# Patient Record
Sex: Female | Born: 1935
Health system: Southern US, Community
[De-identification: ages and names within clinical notes are randomized; demographics above are authoritative.]

## PROBLEM LIST (undated history)

## (undated) DIAGNOSIS — E785 Hyperlipidemia, unspecified: Secondary | ICD-10-CM

## (undated) DIAGNOSIS — G40909 Epilepsy, unspecified, not intractable, without status epilepticus: Secondary | ICD-10-CM

## (undated) DIAGNOSIS — I679 Cerebrovascular disease, unspecified: Secondary | ICD-10-CM

## (undated) DIAGNOSIS — I639 Cerebral infarction, unspecified: Secondary | ICD-10-CM

## (undated) DIAGNOSIS — M1990 Unspecified osteoarthritis, unspecified site: Secondary | ICD-10-CM

## (undated) DIAGNOSIS — R112 Nausea with vomiting, unspecified: Secondary | ICD-10-CM

## (undated) DIAGNOSIS — T8859XA Other complications of anesthesia, initial encounter: Secondary | ICD-10-CM

## (undated) DIAGNOSIS — I10 Essential (primary) hypertension: Secondary | ICD-10-CM

## (undated) DIAGNOSIS — Z8673 Personal history of transient ischemic attack (TIA), and cerebral infarction without residual deficits: Secondary | ICD-10-CM

## (undated) DIAGNOSIS — N182 Chronic kidney disease, stage 2 (mild): Secondary | ICD-10-CM

## (undated) DIAGNOSIS — T4145XA Adverse effect of unspecified anesthetic, initial encounter: Secondary | ICD-10-CM

## (undated) DIAGNOSIS — E559 Vitamin D deficiency, unspecified: Secondary | ICD-10-CM

## (undated) DIAGNOSIS — R32 Unspecified urinary incontinence: Secondary | ICD-10-CM

## (undated) DIAGNOSIS — Z9889 Other specified postprocedural states: Secondary | ICD-10-CM

## (undated) HISTORY — DX: Essential (primary) hypertension: I10

## (undated) HISTORY — DX: Cerebrovascular disease, unspecified: I67.9

## (undated) HISTORY — DX: Personal history of transient ischemic attack (TIA), and cerebral infarction without residual deficits: Z86.73

## (undated) HISTORY — DX: Chronic kidney disease, stage 2 (mild): N18.2

## (undated) HISTORY — DX: Unspecified osteoarthritis, unspecified site: M19.90

## (undated) HISTORY — DX: Epilepsy, unspecified, not intractable, without status epilepticus: G40.909

## (undated) HISTORY — DX: Hyperlipidemia, unspecified: E78.5

## (undated) HISTORY — DX: Vitamin D deficiency, unspecified: E55.9

## (undated) HISTORY — DX: Cerebral infarction, unspecified: I63.9

---

## 2004-12-18 HISTORY — PX: ROTATOR CUFF REPAIR: SHX139

## 2005-12-18 HISTORY — PX: KNEE SURGERY: SHX244

## 2014-12-03 ENCOUNTER — Other Ambulatory Visit (INDEPENDENT_AMBULATORY_CARE_PROVIDER_SITE_OTHER): Payer: Medicare Other

## 2014-12-03 ENCOUNTER — Encounter: Payer: Self-pay | Admitting: Internal Medicine

## 2014-12-03 ENCOUNTER — Ambulatory Visit (INDEPENDENT_AMBULATORY_CARE_PROVIDER_SITE_OTHER): Payer: Medicare Other | Admitting: Internal Medicine

## 2014-12-03 VITALS — BP 140/62 | HR 82 | Temp 98.1°F | Resp 18 | Ht 62.0 in | Wt 105.6 lb

## 2014-12-03 DIAGNOSIS — M1611 Unilateral primary osteoarthritis, right hip: Secondary | ICD-10-CM

## 2014-12-03 DIAGNOSIS — Z0000 Encounter for general adult medical examination without abnormal findings: Secondary | ICD-10-CM | POA: Insufficient documentation

## 2014-12-03 DIAGNOSIS — E785 Hyperlipidemia, unspecified: Secondary | ICD-10-CM

## 2014-12-03 LAB — BASIC METABOLIC PANEL
BUN: 11 mg/dL (ref 6–23)
CALCIUM: 9.4 mg/dL (ref 8.4–10.5)
CO2: 25 meq/L (ref 19–32)
Chloride: 101 mEq/L (ref 96–112)
Creatinine, Ser: 0.6 mg/dL (ref 0.4–1.2)
GFR: 111.05 mL/min (ref >60.00)
Glucose, Bld: 81 mg/dL (ref 70–99)
Potassium: 4 mEq/L (ref 3.5–5.1)
SODIUM: 136 meq/L (ref 135–145)

## 2014-12-03 LAB — LIPID PANEL
Cholesterol: 281 mg/dL — ABNORMAL HIGH (ref 0–200)
HDL: 91.7 mg/dL (ref >39.00)
LDL Cholesterol: 178 mg/dL — ABNORMAL HIGH (ref 0–99)
NonHDL: 189.3
Total CHOL/HDL Ratio: 3
Triglycerides: 59 mg/dL (ref 0.0–149.0)
VLDL: 11.8 mg/dL (ref 0.0–40.0)

## 2014-12-03 NOTE — Assessment & Plan Note (Addendum)
She will follow-up with orthopedics soon. Hopefully we can continue with corticosteroid injections every 3 months and be able to avoid hip replacement. Check BMP in case she needs NSAID therapy.

## 2014-12-03 NOTE — Progress Notes (Signed)
Pre visit review using our clinic review tool, if applicable. No additional management support is needed unless otherwise documented below in the visit note. 

## 2014-12-03 NOTE — Patient Instructions (Signed)
We will check your blood work today, we will call you back with those results.  We will see back next year for your physical. If you have any new problems or questions before then please feel free to call our office.

## 2014-12-03 NOTE — Progress Notes (Signed)
   Subjective:    Patient ID: Heather Nielsen, female    DOB: 03/04/1934, 80 y.o.   MRN: [id number]  HPI The patient is a 80 year old woman who comes in today to establish care. She does have past medical history of osteoarthritis. She states that over the years it has gotten markedly worse however at this time she is still able to get around fairly well using cortisone injections every 3 months. She is concerned that eventually she will need hip replacement. She denies any chest pains, shortness of breath, abdominal pains. She's never had colon cancer screening and does not wish to have that. She describes having bad reaction with pneumonia shot last year and does not ever wish to get another. She declines other joint pain with the exception of her right hip. She states she never has any balance problems. She denies any depression or anxiety. She is occasionally stressed and her husband however deals with that fairly well.  Review of Systems  Constitutional: Negative for fever, activity change, appetite change, fatigue and unexpected weight change.  HENT: Negative.   Respiratory: Negative for cough, chest tightness, shortness of breath and wheezing.   Cardiovascular: Negative for chest pain, palpitations and leg swelling.  Gastrointestinal: Negative for nausea, abdominal pain, diarrhea, constipation and abdominal distention.  Genitourinary: Negative.   Musculoskeletal: Positive for arthralgias. Negative for myalgias, back pain and gait problem.  Skin: Negative.   Neurological: Negative for dizziness, weakness, light-headedness, numbness and headaches.  Psychiatric/Behavioral: Negative.       Objective:   Physical Exam  Constitutional: She is oriented to person, place, and time. She appears well-developed and well-nourished.  HENT:  Head: Normocephalic and atraumatic.  Eyes: EOM are normal.  Neck: Normal range of motion.  Cardiovascular: Normal rate and regular rhythm.   No murmur  heard. Carotid bilateral without bruit  Pulmonary/Chest: Effort normal and breath sounds normal. No respiratory distress. She has no wheezes. She has no rales.  Abdominal: Soft. Bowel sounds are normal. She exhibits no distension. There is no tenderness. There is no rebound.  Musculoskeletal: She exhibits no edema.  Neurological: She is alert and oriented to person, place, and time. Coordination normal.  Skin: Skin is warm and dry.   Filed Vitals:   12/03/14 0906  BP: 140/62  Pulse: 82  Temp: 98.1 F (36.7 C)  TempSrc: Oral  Resp: 18  Height: 5\' 2"  (1.575 m)  Weight: 105 lb 9.6 oz (47.9 kg)  SpO2: 95%      Assessment & Plan:

## 2014-12-03 NOTE — Assessment & Plan Note (Signed)
She states she did have colonoscopy many years ago, refuses further colonoscopy. Declines any immunizations today. Never had shingles injection. Does not wish to get further mammograms. Check lipid panel.

## 2014-12-21 ENCOUNTER — Other Ambulatory Visit: Payer: Self-pay | Admitting: Internal Medicine

## 2014-12-21 MED ORDER — PRAVASTATIN SODIUM 20 MG PO TABS: 20.0000 mg | ORAL_TABLET | Freq: Every day | ORAL | Status: DC

## 2014-12-23 ENCOUNTER — Telehealth: Payer: Self-pay | Admitting: Internal Medicine

## 2014-12-23 DIAGNOSIS — E785 Hyperlipidemia, unspecified: Secondary | ICD-10-CM

## 2014-12-23 NOTE — Telephone Encounter (Signed)
Spoke with patient and gave her lab results. According to the notes Abby spoke with patient on 12/08/14 and gave her the lab results. Patient said she was never informed and was not the person that Abby spoke to. She thinks there is a confusion and does not think these results are accurate. She states that she is 106 pounds and she is in shape. She already diets and exercises and wants to get the labs drawn again. Can we reorder the lipid panel? Please advise, thanks.

## 2014-12-23 NOTE — Telephone Encounter (Signed)
Pt called and states she never recd her lab results, I see a note she was called but she says she was not, pls advise.

## 2014-12-24 NOTE — Telephone Encounter (Signed)
Absolutely we have reorder this and she can come get when she is fasting. Sorry for the confusion.

## 2014-12-25 NOTE — Telephone Encounter (Signed)
Left message asking patient to call me back

## 2015-06-04 ENCOUNTER — Telehealth: Payer: Self-pay | Admitting: Internal Medicine

## 2015-06-04 NOTE — Progress Notes (Signed)
Please put orders in Epic surgery 06-16-15 pre op 06-10-15 Thanks 

## 2015-06-04 NOTE — Telephone Encounter (Signed)
Toniann Fail from Dr. Lequita Halt called about a clearance letter that was sent over that was not signed. She is going to fax it back over so you can sign and please fax back to [phone].

## 2015-06-08 ENCOUNTER — Ambulatory Visit: Payer: Self-pay | Admitting: Orthopedic Surgery

## 2015-06-08 NOTE — Progress Notes (Signed)
Preoperative surgical orders have been place into the Epic hospital system for Heather Nielsen on 06/08/2015, 10:38 AM  by Patrica Duel for surgery on 06-16-2015.  Preop Total Hip - Anterior Approach orders including IV Tylenol, and IV Decadron as long as there are no contraindications to the above medications. Avel Peace, PA-C

## 2015-06-09 NOTE — Patient Instructions (Addendum)
Heather Nielsen  06/09/2015   Your procedure is scheduled on: 06/16/15  [facility]  Report to [facility] Main  Entrance take [facility]  elevators to 3rd floor to  Short Stay Center at  0730 AM.  Call this number if you have problems the morning of surgery [phone]   Remember: ONLY 1 PERSON MAY GO WITH YOU TO SHORT STAY TO GET  READY MORNING OF YOUR SURGERY.  Do not eat food or drink liquids :After Midnight.TUESDAY NIGHT     Take these medicines the morning of surgery with A SIP OF WATER: NONE                               You may not have any metal on your body including hair pins and              piercings  Do not wear jewelry, make-up, lotions, powders or perfumes, deodorant             Do not wear nail polish.  Do not shave  48 hours prior to surgery.               Do not bring valuables to the hospital. [facility] IS NOT             RESPONSIBLE   FOR VALUABLES.  Contacts, dentures or bridgework may not be worn into surgery.  Leave suitcase in the car. After surgery it may be brought to your room.     Patients discharged the day of surgery will not be allowed to drive home.  Name and phone number of your driver:  Special Instructions: N/A              Please read over the following fact sheets you were given:MRSA FACT SHEET; BLOOD TRANSFUSION FACT SHEET; INCENTIVE SPIROMETER _____________________________________________________________________             [facility] - Preparing for Surgery Before surgery, you can play an important role.  Because skin is not sterile, your skin needs to be as free of germs as possible.  You can reduce the number of germs on your skin by washing with CHG (chlorahexidine gluconate) soap before surgery.  CHG is an antiseptic cleaner which kills germs and bonds with the skin to continue killing germs even after washing. Please DO NOT use if you have an allergy to CHG or antibacterial soaps.  If your skin becomes reddened/irritated  stop using the CHG and inform your nurse when you arrive at Short Stay. Do not shave (including legs and underarms) for at least 48 hours prior to the first CHG shower.  You may shave your face/neck. Please follow these instructions carefully:  1.  Shower with CHG Soap the night before surgery and the  morning of Surgery.  2.  If you choose to wash your hair, wash your hair first as usual with your  normal  shampoo.  3.  After you shampoo, rinse your hair and body thoroughly to remove the  shampoo.                           4.  Use CHG as you would any other liquid soap.  You can apply chg directly  to the skin and wash  Gently with a scrungie or clean washcloth.  5.  Apply the CHG Soap to your body ONLY FROM THE NECK DOWN.   Do not use on face/ open                           Wound or open sores. Avoid contact with eyes, ears mouth and genitals (private parts).                       Wash face,  Genitals (private parts) with your normal soap.             6.  Wash thoroughly, paying special attention to the area where your surgery  will be performed.  7.  Thoroughly rinse your body with warm water from the neck down.  8.  DO NOT shower/wash with your normal soap after using and rinsing off  the CHG Soap.                9.  Pat yourself dry with a clean towel.            10.  Wear clean pajamas.            11.  Place clean sheets on your bed the night of your first shower and do not  sleep with pets. Day of Surgery : Do not apply any lotions/deodorants the morning of surgery.  Please wear clean clothes to the hospital/surgery center.  FAILURE TO FOLLOW THESE INSTRUCTIONS MAY RESULT IN THE CANCELLATION OF YOUR SURGERY PATIENT SIGNATURE_________________________________  NURSE SIGNATURE__________________________________  ________________________________________________________________________  WHAT IS A BLOOD TRANSFUSION? Blood Transfusion Information  A transfusion is the  replacement of blood or some of its parts. Blood is made up of multiple cells which provide different functions.  Red blood cells carry oxygen and are used for blood loss replacement.  White blood cells fight against infection.  Platelets control bleeding.  Plasma helps clot blood.  Other blood products are available for specialized needs, such as hemophilia or other clotting disorders. BEFORE THE TRANSFUSION  Who gives blood for transfusions?   Healthy volunteers who are fully evaluated to make sure their blood is safe. This is blood bank blood. Transfusion therapy is the safest it has ever been in the practice of medicine. Before blood is taken from a donor, a complete history is taken to make sure that person has no history of diseases nor engages in risky social behavior (examples are intravenous drug use or sexual activity with multiple partners). The donor's travel history is screened to minimize risk of transmitting infections, such as malaria. The donated blood is tested for signs of infectious diseases, such as HIV and hepatitis. The blood is then tested to be sure it is compatible with you in order to minimize the chance of a transfusion reaction. If you or a relative donates blood, this is often done in anticipation of surgery and is not appropriate for emergency situations. It takes many days to process the donated blood. RISKS AND COMPLICATIONS Although transfusion therapy is very safe and saves many lives, the main dangers of transfusion include:  1. Getting an infectious disease. 2. Developing a transfusion reaction. This is an allergic reaction to something in the blood you were given. Every precaution is taken to prevent this. The decision to have a blood transfusion has been considered carefully by your caregiver before blood is given. Blood is not given unless the benefits outweigh  the risks. AFTER THE TRANSFUSION  Right after receiving a blood transfusion, you will usually  feel much better and more energetic. This is especially true if your red blood cells have gotten low (anemic). The transfusion raises the level of the red blood cells which carry oxygen, and this usually causes an energy increase.  The nurse administering the transfusion will monitor you carefully for complications. HOME CARE INSTRUCTIONS  No special instructions are needed after a transfusion. You may find your energy is better. Speak with your caregiver about any limitations on activity for underlying diseases you may have. SEEK MEDICAL CARE IF:   Your condition is not improving after your transfusion.  You develop redness or irritation at the intravenous (IV) site. SEEK IMMEDIATE MEDICAL CARE IF:  Any of the following symptoms occur over the next 12 hours:  Shaking chills.  You have a temperature by mouth above 102 F (38.9 C), not controlled by medicine.  Chest, back, or muscle pain.  People around you feel you are not acting correctly or are confused.  Shortness of breath or difficulty breathing.  Dizziness and fainting.  You get a rash or develop hives.  You have a decrease in urine output.  Your urine turns a dark color or changes to pink, red, or brown. Any of the following symptoms occur over the next 10 days:  You have a temperature by mouth above 102 F (38.9 C), not controlled by medicine.  Shortness of breath.  Weakness after normal activity.  The white part of the eye turns yellow (jaundice).  You have a decrease in the amount of urine or are urinating less often.  Your urine turns a dark color or changes to pink, red, or brown. Document Released: 12/01/2000 Document Revised: 02/26/2012 Document Reviewed: 07/20/2008 ExitCare Patient Information 2014 [place].  _______________________________________________________________________  Incentive Spirometer  An incentive spirometer is a tool that can help keep your lungs clear and active. This tool  measures how well you are filling your lungs with each breath. Taking long deep breaths may help reverse or decrease the chance of developing breathing (pulmonary) problems (especially infection) following:  A long period of time when you are unable to move or be active. BEFORE THE PROCEDURE   If the spirometer includes an indicator to show your best effort, your nurse or respiratory therapist will set it to a desired goal.  If possible, sit up straight or lean slightly forward. Try not to slouch.  Hold the incentive spirometer in an upright position. INSTRUCTIONS FOR USE  3. Sit on the edge of your bed if possible, or sit up as far as you can in bed or on a chair. 4. Hold the incentive spirometer in an upright position. 5. Breathe out normally. 6. Place the mouthpiece in your mouth and seal your lips tightly around it. 7. Breathe in slowly and as deeply as possible, raising the piston or the ball toward the top of the column. 8. Hold your breath for 3-5 seconds or for as long as possible. Allow the piston or ball to fall to the bottom of the column. 9. Remove the mouthpiece from your mouth and breathe out normally. 10. Rest for a few seconds and repeat Steps 1 through 7 at least 10 times every 1-2 hours when you are awake. Take your time and take a few normal breaths between deep breaths. 11. The spirometer may include an indicator to show your best effort. Use the indicator as a goal to work  toward during each repetition. 12. After each set of 10 deep breaths, practice coughing to be sure your lungs are clear. If you have an incision (the cut made at the time of surgery), support your incision when coughing by placing a pillow or rolled up towels firmly against it. Once you are able to get out of bed, walk around indoors and cough well. You may stop using the incentive spirometer when instructed by your caregiver.  RISKS AND COMPLICATIONS  Take your time so you do not get dizzy or  light-headed.  If you are in pain, you may need to take or ask for pain medication before doing incentive spirometry. It is harder to take a deep breath if you are having pain. AFTER USE  Rest and breathe slowly and easily.  It can be helpful to keep track of a log of your progress. Your caregiver can provide you with a simple table to help with this. If you are using the spirometer at home, follow these instructions: [place] IF:   You are having difficultly using the spirometer.  You have trouble using the spirometer as often as instructed.  Your pain medication is not giving enough relief while using the spirometer.  You develop fever of 100.5 F (38.1 C) or higher. SEEK IMMEDIATE MEDICAL CARE IF:   You cough up bloody sputum that had not been present before.  You develop fever of 102 F (38.9 C) or greater.  You develop worsening pain at or near the incision site. MAKE SURE YOU:   Understand these instructions.  Will watch your condition.  Will get help right away if you are not doing well or get worse. Document Released: 04/16/2007 Document Revised: 02/26/2012 Document Reviewed: 06/17/2007 [facility] Patient Information 2014 [place], [place].   ________________________________________________________________________

## 2015-06-09 NOTE — Progress Notes (Signed)
Clearance dr Dorise Hiss chart

## 2015-06-10 ENCOUNTER — Encounter (HOSPITAL_COMMUNITY)
Admission: RE | Admit: 2015-06-10 | Discharge: 2015-06-10 | Disposition: A | Discharge status: Home / Self Care | Payer: Medicare Other | Source: Ambulatory Visit | Attending: Orthopedic Surgery | Admitting: Orthopedic Surgery

## 2015-06-10 ENCOUNTER — Encounter (HOSPITAL_COMMUNITY): Payer: Self-pay

## 2015-06-10 DIAGNOSIS — Z01812 Encounter for preprocedural laboratory examination: Secondary | ICD-10-CM | POA: Diagnosis present

## 2015-06-10 DIAGNOSIS — M1611 Unilateral primary osteoarthritis, right hip: Secondary | ICD-10-CM | POA: Insufficient documentation

## 2015-06-10 HISTORY — DX: Unspecified urinary incontinence: R32

## 2015-06-10 HISTORY — DX: Other specified postprocedural states: Z98.890

## 2015-06-10 HISTORY — DX: Other specified postprocedural states: R11.2

## 2015-06-10 HISTORY — DX: Adverse effect of unspecified anesthetic, initial encounter: T41.45XA

## 2015-06-10 HISTORY — DX: Other complications of anesthesia, initial encounter: T88.59XA

## 2015-06-10 LAB — CBC
HEMATOCRIT: 42.5 % (ref 36.0–46.0)
HEMOGLOBIN: 14.6 g/dL (ref 12.0–15.0)
MCH: 30.9 pg (ref 26.0–34.0)
MCHC: 34.4 g/dL (ref 30.0–36.0)
MCV: 90 fL (ref 78.0–100.0)
Platelets: 281 10*3/uL (ref 150–400)
RBC: 4.72 MIL/uL (ref 3.87–5.11)
RDW: 12.5 % (ref 11.5–15.5)
WBC: 6.1 10*3/uL (ref 4.0–10.5)

## 2015-06-10 LAB — COMPREHENSIVE METABOLIC PANEL
ALT: 14 U/L (ref 14–54)
AST: 23 U/L (ref 15–41)
Albumin: 4.3 g/dL (ref 3.5–5.0)
Alkaline Phosphatase: 84 U/L (ref 38–126)
Anion gap: 8 (ref 5–15)
BUN: 12 mg/dL (ref 6–20)
CALCIUM: 9.2 mg/dL (ref 8.9–10.3)
CHLORIDE: 98 mmol/L — AB (ref 101–111)
CO2: 29 mmol/L (ref 22–32)
CREATININE: 0.59 mg/dL (ref 0.44–1.00)
GFR calc Af Amer: >60 mL/min (ref >60)
GFR calc non Af Amer: >60 mL/min (ref >60)
GLUCOSE: 90 mg/dL (ref 65–99)
Potassium: 4.1 mmol/L (ref 3.5–5.1)
SODIUM: 135 mmol/L (ref 135–145)
Total Bilirubin: 0.5 mg/dL (ref 0.3–1.2)
Total Protein: 7.3 g/dL (ref 6.5–8.1)

## 2015-06-10 LAB — URINE MICROSCOPIC-ADD ON

## 2015-06-10 LAB — URINALYSIS, ROUTINE W REFLEX MICROSCOPIC
Bilirubin Urine: NEGATIVE
Glucose, UA: NEGATIVE mg/dL
KETONES UR: NEGATIVE mg/dL
Nitrite: NEGATIVE
PH: 6.5 (ref 5.0–8.0)
PROTEIN: NEGATIVE mg/dL
Specific Gravity, Urine: 1.02 (ref 1.005–1.030)
Urobilinogen, UA: 1 mg/dL (ref 0.0–1.0)

## 2015-06-10 LAB — APTT: aPTT: 27 seconds (ref 24–37)

## 2015-06-10 LAB — PROTIME-INR
INR: 0.99 (ref 0.00–1.49)
PROTHROMBIN TIME: 13.3 s (ref 11.6–15.2)

## 2015-06-10 LAB — SURGICAL PCR SCREEN
MRSA, PCR: NEGATIVE
Staphylococcus aureus: NEGATIVE

## 2015-06-10 LAB — ABO/RH: ABO/RH(D): B POS

## 2015-06-10 NOTE — Progress Notes (Signed)
Urinalysis and micro results per epic per PAT visit 06/10/2015 sent to Dr Lequita Halt

## 2015-06-11 ENCOUNTER — Ambulatory Visit: Payer: Self-pay | Admitting: Orthopedic Surgery

## 2015-06-11 NOTE — H&P (Signed)
Heather Nielsen DOB: [birth year] Married / Language: English / Race: White Female Date of Admission:  06-16-2015 CC:  Right Hip Pain History of Present Illness The patient is a 81 year old female who comes in for a preoperative History and Physical. The patient is scheduled for a right total hip arthroplasty (anterior) to be performed by Dr. Gus Rankin. [name], MD at [facility] on 06-16-2015. The patient is a 81 year old female who is being followed for their right hip pain and osteoarthritis. The patient has reported only temporary improvement of their symptoms with: Cortisone injections (the last IA injection helped a lot). The cortisone injection did help her quite a bit. She said that the intense episodes of pain have subsided in the past with previous injections but does return. She has advanced arthritis. She has not had any significant progression of her arthritis. It is advanced and end stage now. She is ready to have a hip replacement at this time. They have been treated conservatively in the past for the above stated problem and despite conservative measures, they continue to have progressive pain and severe functional limitations and dysfunction. They have failed non-operative management including home exercise, medications, and injections. It is felt that they would benefit from undergoing total joint replacement. Risks and benefits of the procedure have been discussed with the patient and they elect to proceed with surgery. There are no active contraindications to surgery such as ongoing infection or rapidly progressive neurological disease.  Problem List/Past Medical Primary osteoarthritis of right hip (M16.11) Osteoporosis Osteoarthritis  Allergies  Pneumococcal 7-Val Conj Vacc *VACCINES* Penicillin  Family History Heart Disease Brother, Mother. Kidney disease Father. Cerebrovascular Accident Mother. First Degree Relatives reported Heart disease in female family  member before age 36 Hypertension Mother.  Social History  Tobacco use Former smoker. 09/08/2014: smoke(d) less than 1/2 pack(s) per day Tobacco / smoke exposure 09/08/2014: no Marital status married Not under pain contract No history of drug/alcohol rehab Number of flights of stairs before winded greater than 5 Living situation live with spouse Exercise Exercises daily; does running / walking and individual sport Current drinker 09/08/2014: Currently drinks wine and hard liquor 8-14 times per week Children 5 or more Current work status retired  Medication History  No Current Medications  Past Surgical History Arthroscopy of Knee right Arthroscopy of Shoulder right  Review of Systems  General Not Present- Chills, Fatigue, Fever, Memory Loss, Night Sweats, Weight Gain and Weight Loss. Skin Not Present- Eczema, Hives, Itching, Lesions and Rash. HEENT Not Present- Dentures, Double Vision, Headache, Hearing Loss, Tinnitus and Visual Loss. Respiratory Not Present- Allergies, Chronic Cough, Coughing up blood, Shortness of breath at rest and Shortness of breath with exertion. Cardiovascular Not Present- Chest Pain, Difficulty Breathing Lying Down, Murmur, Palpitations, Racing/skipping heartbeats and Swelling. Gastrointestinal Not Present- Abdominal Pain, Bloody Stool, Constipation, Diarrhea, Difficulty Swallowing, Heartburn, Jaundice, Loss of appetitie, Nausea and Vomiting. Female Genitourinary Present- Incontinence. Not Present- Blood in Urine, Discharge, Flank Pain, Painful Urination, Urgency, Urinary frequency, Urinary Retention, Urinating at Night and Weak urinary stream. Musculoskeletal Present- Joint Pain and Joint Swelling. Not Present- Back Pain, Morning Stiffness, Muscle Pain, Muscle Weakness and Spasms. Neurological Not Present- Blackout spells, Difficulty with balance, Dizziness, Paralysis, Tremor and Weakness. Psychiatric Not Present- Insomnia.  Vital Pulse:  64 (Regular)  BP: 154/74 (Sitting, Right Arm, Standard  Physical Exam General Mental Status -Alert, cooperative and good historian. General Appearance-pleasant, Not in acute distress. Orientation-Oriented X3. Build & Nutrition-Lean, [place], Well nourished  and Well developed.  Head and Neck Head-normocephalic, atraumatic . Neck Global Assessment - supple, no bruit auscultated on the right, no bruit auscultated on the left.  Eye Pupil - Bilateral-Regular and Round. Motion - Bilateral-EOMI.  Chest and Lung Exam Auscultation Breath sounds - clear at anterior chest wall and clear at posterior chest wall. Adventitious sounds - No Adventitious sounds.  Cardiovascular Auscultation Rhythm - Regular rate and rhythm. Heart Sounds - S1 WNL and S2 WNL. Murmurs & Other Heart Sounds - Auscultation of the heart reveals - No Murmurs.  Abdomen Palpation/Percussion Tenderness - Abdomen is non-tender to palpation. Rigidity (guarding) - Abdomen is soft. Auscultation Auscultation of the abdomen reveals - Bowel sounds normal.  Female Genitourinary Note: Not done, not pertinent to present illness   Musculoskeletal Note: On exam, she is alert and oriented in no apparent distress. Evaluation of her right hip shows flexion about 90, about 10 degrees internal rotation and 20 external rotation and 20 abduction. Gait pattern is slightly antalgic.  RADIOGRAPHS: AP pelvis and lateral of the right hip shows severe end stage arthritis of the right hip with bone on bone with significant subchondral cystic formation.  Assessment & Plan Primary osteoarthritis of right hip (M16.11) Note:Surgical Plans: Right Total Hip Replacement - Anterior Approach  Disposition: Home  PCP: Dr. Audley HoseKoller  IV TXA  Anesthesia Issues: Postop nausea following knee procedure in the past.  Signed electronically by Beckey RutterAlezandrew L Perkins, III PA-C

## 2015-06-16 ENCOUNTER — Encounter (HOSPITAL_COMMUNITY)
Admission: RE | Disposition: A | Discharge status: Home / Self Care | Payer: Self-pay | Source: Ambulatory Visit | Attending: Orthopedic Surgery

## 2015-06-16 ENCOUNTER — Encounter (HOSPITAL_COMMUNITY): Payer: Self-pay | Admitting: *Deleted

## 2015-06-16 ENCOUNTER — Inpatient Hospital Stay (HOSPITAL_COMMUNITY)
Admission: RE | Admit: 2015-06-16 | Discharge: 2015-06-17 | DRG: 470 | Disposition: A | Discharge status: Home Health | Payer: Medicare Other | Source: Ambulatory Visit | Attending: Orthopedic Surgery | Admitting: Orthopedic Surgery

## 2015-06-16 ENCOUNTER — Inpatient Hospital Stay (HOSPITAL_COMMUNITY): Payer: Medicare Other

## 2015-06-16 ENCOUNTER — Inpatient Hospital Stay (HOSPITAL_COMMUNITY): Payer: Medicare Other | Admitting: Anesthesiology

## 2015-06-16 DIAGNOSIS — Z887 Allergy status to serum and vaccine status: Secondary | ICD-10-CM

## 2015-06-16 DIAGNOSIS — M810 Age-related osteoporosis without current pathological fracture: Secondary | ICD-10-CM | POA: Diagnosis present

## 2015-06-16 DIAGNOSIS — Z880 Allergy status to penicillin: Secondary | ICD-10-CM

## 2015-06-16 DIAGNOSIS — M169 Osteoarthritis of hip, unspecified: Secondary | ICD-10-CM | POA: Diagnosis present

## 2015-06-16 DIAGNOSIS — Z87891 Personal history of nicotine dependence: Secondary | ICD-10-CM | POA: Diagnosis not present

## 2015-06-16 DIAGNOSIS — M1611 Unilateral primary osteoarthritis, right hip: Principal | ICD-10-CM | POA: Diagnosis present

## 2015-06-16 DIAGNOSIS — Z96649 Presence of unspecified artificial hip joint: Secondary | ICD-10-CM

## 2015-06-16 DIAGNOSIS — M25551 Pain in right hip: Secondary | ICD-10-CM | POA: Diagnosis present

## 2015-06-16 HISTORY — PX: TOTAL HIP ARTHROPLASTY: SHX124

## 2015-06-16 LAB — TYPE AND SCREEN
ABO/RH(D): B POS
Antibody Screen: NEGATIVE

## 2015-06-16 IMAGING — CR DG PORTABLE PELVIS
1 series · 1 of 1 positions shown · non-contrast
Comparison: None.

CLINICAL DATA: Right hip replacement

EXAM:
PORTABLE PELVIS 1-2 VIEWS

[AP]
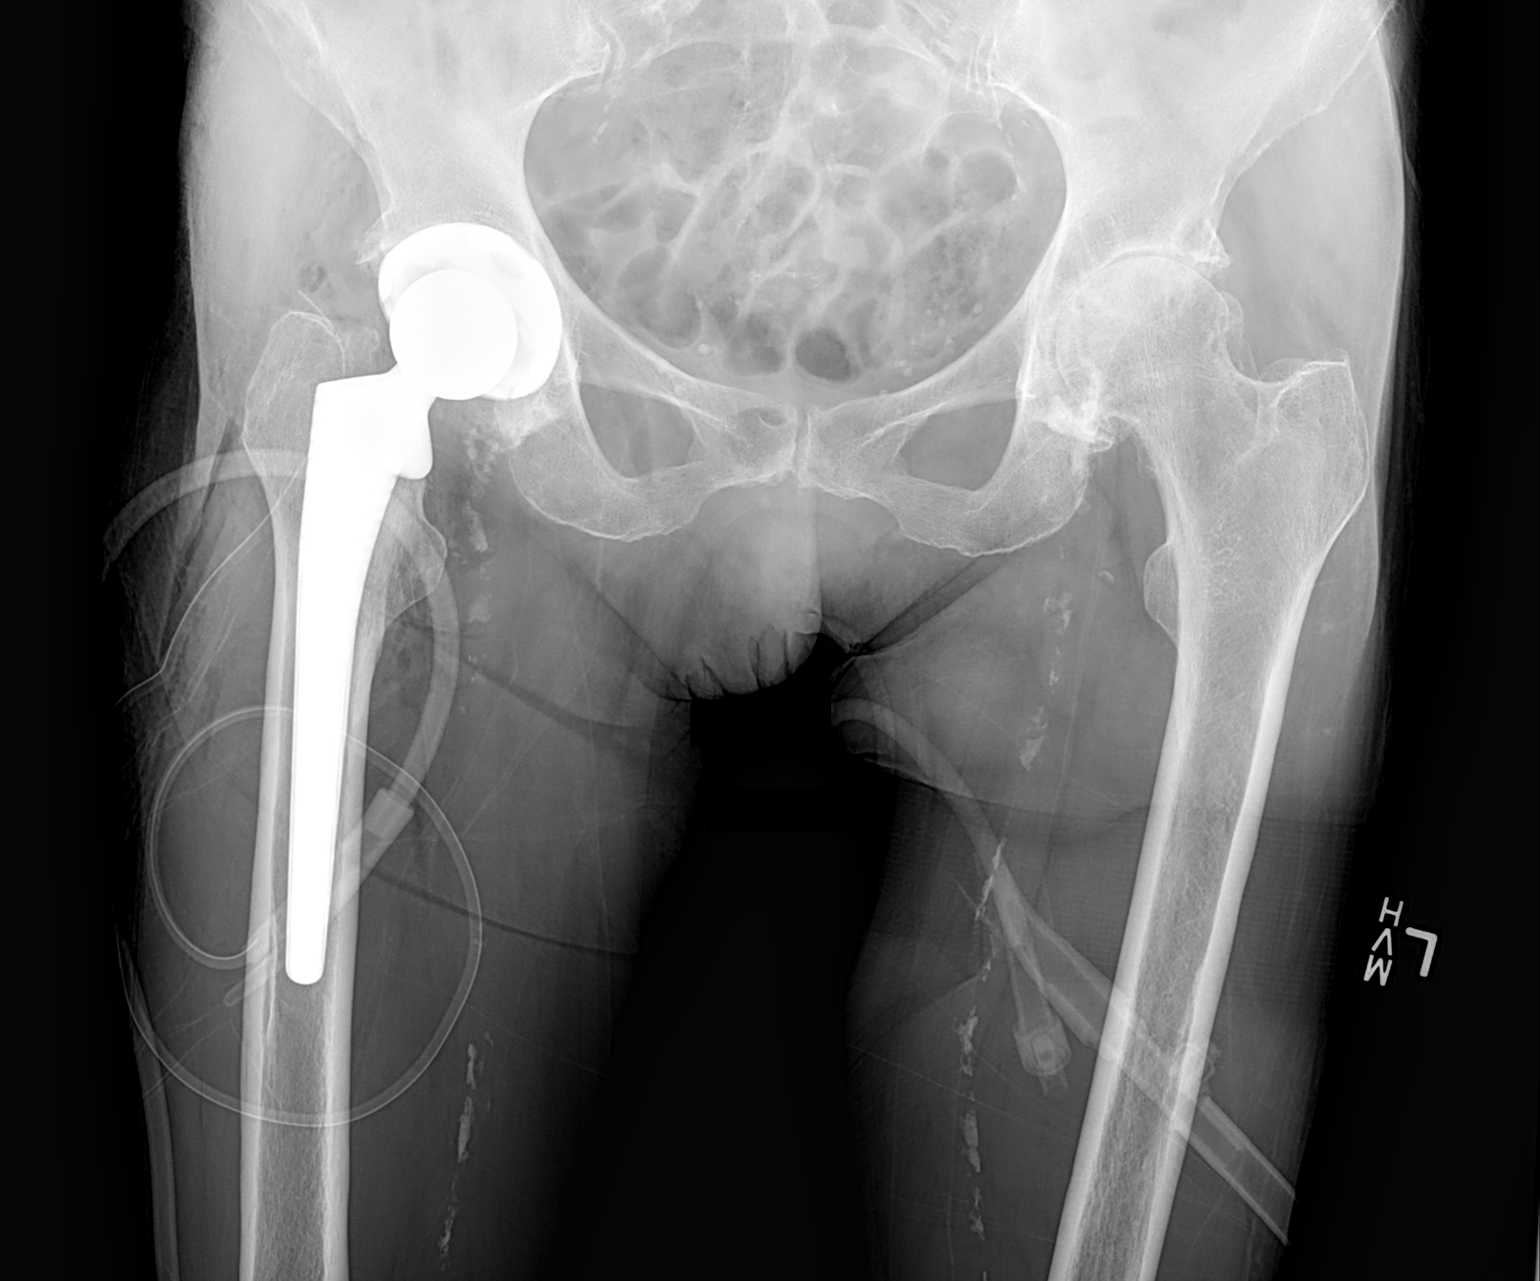

[1 of 1 positions shown; findings below may reference images not displayed]

FINDINGS: Interval right total hip arthroplasty without failure or
complication. No fracture or dislocation. Postsurgical changes in
the soft tissues surrounding the hip. Surgical drain in place.

Severe osteoarthritis of the left hip with severe axial joint space
narrowing.
IMPRESSION: Interval, right total hip arthroplasty.

## 2015-06-16 SURGERY — ARTHROPLASTY, HIP, TOTAL, ANTERIOR APPROACH
Anesthesia: General | Site: Hip | Laterality: Right

## 2015-06-16 MED ORDER — METOCLOPRAMIDE HCL 10 MG PO TABS: 5.0000 mg | ORAL_TABLET | Freq: Three times a day (TID) | ORAL | Status: DC | PRN

## 2015-06-16 MED ORDER — ONDANSETRON HCL 4 MG PO TABS: 4.0000 mg | ORAL_TABLET | Freq: Four times a day (QID) | ORAL | Status: DC | PRN

## 2015-06-16 MED ORDER — DEXAMETHASONE SODIUM PHOSPHATE 10 MG/ML IJ SOLN
10.0000 mg | Freq: Once | INTRAMUSCULAR | Status: AC
  Administered 2015-06-16: 10 mg via INTRAVENOUS

## 2015-06-16 MED ORDER — DOCUSATE SODIUM 100 MG PO CAPS
100.0000 mg | ORAL_CAPSULE | Freq: Two times a day (BID) | ORAL | Status: DC
  Administered 2015-06-16 – 2015-06-17 (×2): 100 mg via ORAL

## 2015-06-16 MED ORDER — ACETAMINOPHEN 10 MG/ML IV SOLN
INTRAVENOUS | Status: AC
  Filled 2015-06-16: qty 100

## 2015-06-16 MED ORDER — BUPIVACAINE LIPOSOME 1.3 % IJ SUSP
20.0000 mL | Freq: Once | INTRAMUSCULAR | Status: DC
  Filled 2015-06-16: qty 20

## 2015-06-16 MED ORDER — FLEET ENEMA 7-19 GM/118ML RE ENEM: 1.0000 | ENEMA | Freq: Once | RECTAL | Status: AC | PRN

## 2015-06-16 MED ORDER — 0.9 % SODIUM CHLORIDE (POUR BTL) OPTIME
TOPICAL | Status: DC | PRN
  Administered 2015-06-16: 1000 mL

## 2015-06-16 MED ORDER — HYDROMORPHONE HCL 1 MG/ML IJ SOLN
INTRAMUSCULAR | Status: AC
  Filled 2015-06-16: qty 1

## 2015-06-16 MED ORDER — METOCLOPRAMIDE HCL 5 MG/ML IJ SOLN: 5.0000 mg | Freq: Three times a day (TID) | INTRAMUSCULAR | Status: DC | PRN

## 2015-06-16 MED ORDER — MIDAZOLAM HCL 5 MG/5ML IJ SOLN
INTRAMUSCULAR | Status: DC | PRN
  Administered 2015-06-16: 1 mg via INTRAVENOUS

## 2015-06-16 MED ORDER — FENTANYL CITRATE (PF) 100 MCG/2ML IJ SOLN
25.0000 ug | INTRAMUSCULAR | Status: DC | PRN
  Administered 2015-06-16: 25 ug via INTRAVENOUS

## 2015-06-16 MED ORDER — OXYCODONE HCL 5 MG PO TABS: 5.0000 mg | ORAL_TABLET | ORAL | Status: DC | PRN

## 2015-06-16 MED ORDER — DEXTROSE 5 % IV SOLN
500.0000 mg | Freq: Four times a day (QID) | INTRAVENOUS | Status: DC | PRN
  Administered 2015-06-16: 500 mg via INTRAVENOUS
  Filled 2015-06-16 (×2): qty 5

## 2015-06-16 MED ORDER — PHENOL 1.4 % MT LIQD: 1.0000 | OROMUCOSAL | Status: DC | PRN

## 2015-06-16 MED ORDER — DEXTROSE-NACL 5-0.9 % IV SOLN
INTRAVENOUS | Status: DC
  Administered 2015-06-16: 13:00:00 via INTRAVENOUS

## 2015-06-16 MED ORDER — BISACODYL 10 MG RE SUPP: 10.0000 mg | Freq: Every day | RECTAL | Status: DC | PRN

## 2015-06-16 MED ORDER — VANCOMYCIN HCL IN DEXTROSE 1-5 GM/200ML-% IV SOLN
1000.0000 mg | Freq: Two times a day (BID) | INTRAVENOUS | Status: AC
  Administered 2015-06-16: 1000 mg via INTRAVENOUS
  Filled 2015-06-16: qty 200

## 2015-06-16 MED ORDER — PROPOFOL 10 MG/ML IV BOLUS
INTRAVENOUS | Status: AC
  Filled 2015-06-16: qty 20

## 2015-06-16 MED ORDER — ACETAMINOPHEN 325 MG PO TABS: 650.0000 mg | ORAL_TABLET | Freq: Four times a day (QID) | ORAL | Status: DC | PRN

## 2015-06-16 MED ORDER — EPHEDRINE SULFATE 50 MG/ML IJ SOLN
INTRAMUSCULAR | Status: DC | PRN
  Administered 2015-06-16: 10 mg via INTRAVENOUS
  Administered 2015-06-16 (×3): 5 mg via INTRAVENOUS

## 2015-06-16 MED ORDER — EPHEDRINE SULFATE 50 MG/ML IJ SOLN
INTRAMUSCULAR | Status: AC
  Filled 2015-06-16: qty 1

## 2015-06-16 MED ORDER — MORPHINE SULFATE 2 MG/ML IJ SOLN: 1.0000 mg | INTRAMUSCULAR | Status: DC | PRN

## 2015-06-16 MED ORDER — POLYETHYLENE GLYCOL 3350 17 G PO PACK: 17.0000 g | PACK | Freq: Every day | ORAL | Status: DC | PRN

## 2015-06-16 MED ORDER — FENTANYL CITRATE (PF) 100 MCG/2ML IJ SOLN
INTRAMUSCULAR | Status: AC
  Filled 2015-06-16: qty 2

## 2015-06-16 MED ORDER — LACTATED RINGERS IV SOLN: INTRAVENOUS | Status: DC

## 2015-06-16 MED ORDER — MENTHOL 3 MG MT LOZG: 1.0000 | LOZENGE | OROMUCOSAL | Status: DC | PRN

## 2015-06-16 MED ORDER — ACETAMINOPHEN 650 MG RE SUPP: 650.0000 mg | Freq: Four times a day (QID) | RECTAL | Status: DC | PRN

## 2015-06-16 MED ORDER — MEPERIDINE HCL 50 MG/ML IJ SOLN: 6.2500 mg | INTRAMUSCULAR | Status: DC | PRN

## 2015-06-16 MED ORDER — PROPOFOL INFUSION 10 MG/ML OPTIME
INTRAVENOUS | Status: DC | PRN
  Administered 2015-06-16: 75 ug/kg/min via INTRAVENOUS

## 2015-06-16 MED ORDER — TRANEXAMIC ACID 1000 MG/10ML IV SOLN
1000.0000 mg | INTRAVENOUS | Status: AC
  Administered 2015-06-16: 1000 mg via INTRAVENOUS
  Filled 2015-06-16: qty 10

## 2015-06-16 MED ORDER — RIVAROXABAN 10 MG PO TABS
10.0000 mg | ORAL_TABLET | Freq: Every day | ORAL | Status: DC
  Administered 2015-06-17: 10 mg via ORAL
  Filled 2015-06-16 (×2): qty 1

## 2015-06-16 MED ORDER — SODIUM CHLORIDE 0.9 % IV SOLN: INTRAVENOUS | Status: DC

## 2015-06-16 MED ORDER — METHOCARBAMOL 500 MG PO TABS
500.0000 mg | ORAL_TABLET | Freq: Four times a day (QID) | ORAL | Status: DC | PRN
  Administered 2015-06-16: 500 mg via ORAL
  Filled 2015-06-16: qty 1

## 2015-06-16 MED ORDER — BUPIVACAINE HCL (PF) 0.25 % IJ SOLN
INTRAMUSCULAR | Status: DC | PRN
  Administered 2015-06-16: 30 mL

## 2015-06-16 MED ORDER — FENTANYL CITRATE (PF) 100 MCG/2ML IJ SOLN
INTRAMUSCULAR | Status: DC | PRN
Start: 2015-06-16 — End: 2015-06-16
  Administered 2015-06-16: 50 ug via INTRAVENOUS

## 2015-06-16 MED ORDER — PROMETHAZINE HCL 25 MG/ML IJ SOLN: 6.2500 mg | INTRAMUSCULAR | Status: DC | PRN

## 2015-06-16 MED ORDER — DIPHENHYDRAMINE HCL 12.5 MG/5ML PO ELIX: 12.5000 mg | ORAL_SOLUTION | ORAL | Status: DC | PRN

## 2015-06-16 MED ORDER — VANCOMYCIN HCL IN DEXTROSE 1-5 GM/200ML-% IV SOLN
1000.0000 mg | INTRAVENOUS | Status: AC
  Administered 2015-06-16: 1000 mg via INTRAVENOUS
  Filled 2015-06-16: qty 200

## 2015-06-16 MED ORDER — ONDANSETRON HCL 4 MG/2ML IJ SOLN: 4.0000 mg | Freq: Four times a day (QID) | INTRAMUSCULAR | Status: DC | PRN

## 2015-06-16 MED ORDER — MIDAZOLAM HCL 2 MG/2ML IJ SOLN
INTRAMUSCULAR | Status: AC
  Filled 2015-06-16: qty 2

## 2015-06-16 MED ORDER — CHLORHEXIDINE GLUCONATE 4 % EX LIQD: 60.0000 mL | Freq: Once | CUTANEOUS | Status: DC

## 2015-06-16 MED ORDER — LACTATED RINGERS IV SOLN
INTRAVENOUS | Status: DC
  Administered 2015-06-16 (×3): via INTRAVENOUS

## 2015-06-16 MED ORDER — ACETAMINOPHEN 500 MG PO TABS
1000.0000 mg | ORAL_TABLET | Freq: Four times a day (QID) | ORAL | Status: AC
  Administered 2015-06-16 (×2): 1000 mg via ORAL
  Filled 2015-06-16 (×2): qty 2

## 2015-06-16 MED ORDER — ACETAMINOPHEN 10 MG/ML IV SOLN
1000.0000 mg | Freq: Once | INTRAVENOUS | Status: AC
  Administered 2015-06-16: 1000 mg via INTRAVENOUS

## 2015-06-16 MED ORDER — PHENYLEPHRINE HCL 10 MG/ML IJ SOLN
INTRAMUSCULAR | Status: DC | PRN
  Administered 2015-06-16 (×7): 40 ug via INTRAVENOUS

## 2015-06-16 MED ORDER — BUPIVACAINE HCL (PF) 0.25 % IJ SOLN
INTRAMUSCULAR | Status: AC
  Filled 2015-06-16: qty 30

## 2015-06-16 MED ORDER — KETOROLAC TROMETHAMINE 15 MG/ML IJ SOLN: 7.5000 mg | Freq: Four times a day (QID) | INTRAMUSCULAR | Status: DC | PRN

## 2015-06-16 MED ORDER — DEXAMETHASONE SODIUM PHOSPHATE 10 MG/ML IJ SOLN
10.0000 mg | Freq: Once | INTRAMUSCULAR | Status: AC
  Administered 2015-06-17: 10 mg via INTRAVENOUS
  Filled 2015-06-16: qty 1

## 2015-06-16 MED ORDER — TRAMADOL HCL 50 MG PO TABS: 50.0000 mg | ORAL_TABLET | Freq: Four times a day (QID) | ORAL | Status: DC | PRN

## 2015-06-16 SURGICAL SUPPLY — 40 items
BAG DECANTER FOR FLEXI CONT (MISCELLANEOUS) ×2 IMPLANT
BAG ZIPLOCK 12X15 (MISCELLANEOUS) IMPLANT
BLADE EXTENDED COATED 6.5IN (ELECTRODE) ×2 IMPLANT
BLADE SAG 18X100X1.27 (BLADE) ×2 IMPLANT
CAPT HIP TOTAL 2 ×2 IMPLANT
COVER PERINEAL POST (MISCELLANEOUS) ×2 IMPLANT
DECANTER SPIKE VIAL GLASS SM (MISCELLANEOUS) ×2 IMPLANT
DRAPE C-ARM 42X120 X-RAY (DRAPES) ×2 IMPLANT
DRAPE STERI IOBAN 125X83 (DRAPES) ×2 IMPLANT
DRAPE U-SHAPE 47X51 STRL (DRAPES) ×6 IMPLANT
DRSG ADAPTIC 3X8 NADH LF (GAUZE/BANDAGES/DRESSINGS) ×2 IMPLANT
DRSG MEPILEX BORDER 4X4 (GAUZE/BANDAGES/DRESSINGS) ×2 IMPLANT
DRSG MEPILEX BORDER 4X8 (GAUZE/BANDAGES/DRESSINGS) ×2 IMPLANT
DURAPREP 26ML APPLICATOR (WOUND CARE) ×2 IMPLANT
ELECT REM PT RETURN 9FT ADLT (ELECTROSURGICAL) ×2
ELECTRODE REM PT RTRN 9FT ADLT (ELECTROSURGICAL) ×1 IMPLANT
EVACUATOR 1/8 PVC DRAIN (DRAIN) ×2 IMPLANT
FACESHIELD WRAPAROUND (MASK) ×8 IMPLANT
GLOVE BIO SURGEON STRL SZ7.5 (GLOVE) ×2 IMPLANT
GLOVE BIO SURGEON STRL SZ8 (GLOVE) ×4 IMPLANT
GLOVE BIOGEL PI IND STRL 8 (GLOVE) ×2 IMPLANT
GLOVE BIOGEL PI INDICATOR 8 (GLOVE) ×2
GOWN STRL REUS W/TWL LRG LVL3 (GOWN DISPOSABLE) ×2 IMPLANT
GOWN STRL REUS W/TWL XL LVL3 (GOWN DISPOSABLE) ×2 IMPLANT
KIT BASIN OR (CUSTOM PROCEDURE TRAY) ×2 IMPLANT
NDL SAFETY ECLIPSE 18X1.5 (NEEDLE) ×1 IMPLANT
NEEDLE HYPO 18GX1.5 SHARP (NEEDLE) ×1
PACK TOTAL JOINT (CUSTOM PROCEDURE TRAY) ×2 IMPLANT
PEN SKIN MARKING BROAD (MISCELLANEOUS) ×2 IMPLANT
STRIP CLOSURE SKIN 1/2X4 (GAUZE/BANDAGES/DRESSINGS) ×2 IMPLANT
SUT ETHIBOND NAB CT1 #1 30IN (SUTURE) ×2 IMPLANT
SUT MNCRL AB 4-0 PS2 18 (SUTURE) ×2 IMPLANT
SUT VIC AB 2-0 CT1 27 (SUTURE) ×2
SUT VIC AB 2-0 CT1 TAPERPNT 27 (SUTURE) ×2 IMPLANT
SUT VLOC 180 0 24IN GS25 (SUTURE) ×2 IMPLANT
SYR 30ML LL (SYRINGE) ×2 IMPLANT
SYR 50ML LL SCALE MARK (SYRINGE) IMPLANT
TOWEL OR 17X26 10 PK STRL BLUE (TOWEL DISPOSABLE) ×2 IMPLANT
TOWEL OR NON WOVEN STRL DISP B (DISPOSABLE) IMPLANT
TRAY FOLEY W/METER SILVER 14FR (SET/KITS/TRAYS/PACK) ×2 IMPLANT

## 2015-06-16 NOTE — Anesthesia Procedure Notes (Signed)
Spinal Patient location during procedure: OR Start time: 06/16/2015 9:45 AM End time: 06/16/2015 9:56 AM Staffing Resident/CRNA: Bailey Faiella Performed by: resident/CRNA  Preanesthetic Checklist Completed: patient identified, site marked, surgical consent, pre-op evaluation, timeout performed, IV checked, risks and benefits discussed and monitors and equipment checked Spinal Block Patient position: sitting Prep: Betadine Patient monitoring: heart rate, cardiac monitor, continuous pulse ox and blood pressure Approach: left paramedian Location: L3-4 Injection technique: single-shot Needle Needle type: Spinocan  Needle gauge: 22 G Needle length: 9 cm Needle insertion depth: 6 cm Assessment Sensory level: T6 Additional Notes -heme, -para, VSS. Lot [id number] Exp  2017-10

## 2015-06-16 NOTE — Op Note (Signed)
OPERATIVE REPORT  PREOPERATIVE DIAGNOSIS: Osteoarthritis of the Right hip.   POSTOPERATIVE DIAGNOSIS: Osteoarthritis of the Right hip.   PROCEDURE: Right total hip arthroplasty, anterior approach.   SURGEON: Ollen GrossFrank Jonella Redditt, MD   ASSISTANT: Avel Peacerew Perkins, PA-C  ANESTHESIA:  Spinal  ESTIMATED BLOOD LOSS:-100 ml   DRAINS: Hemovac x1.   COMPLICATIONS: None   CONDITION: PACU - hemodynamically stable.   BRIEF CLINICAL NOTE: Heather Nielsen is a 81 y.o. female who has advanced end-  stage arthritis of her Right  hip with progressively worsening pain and  dysfunction.The patient has failed nonoperative management and presents for  total hip arthroplasty.   PROCEDURE IN DETAIL: After successful administration of spinal  anesthetic, the traction boots for the [facility] bed were placed on both  feet and the [facility], boots placed into the leg  holders. The Right hip was then isolated from the perineum with plastic  drapes and prepped and draped in the usual sterile fashion. ASIS and  greater trochanter were marked and a oblique incision was made, starting  at about 1 cm lateral and 2 cm distal to the ASIS and coursing towards  the anterior cortex of the femur. The skin was cut with a 10 blade  through subcutaneous tissue to the level of the fascia overlying the  tensor fascia lata muscle. The fascia was then incised in line with the  incision at the junction of the anterior third and posterior 2/3rd. The  muscle was teased off the fascia and then the interval between the TFL  and the rectus was developed. The Hohmann retractor was then placed at  the top of the femoral neck over the capsule. The vessels overlying the  capsule were cauterized and the fat on top of the capsule was removed.  A Hohmann retractor was then placed anterior underneath the rectus  femoris to give exposure to the entire anterior capsule. A T-shaped  capsulotomy was performed. The  edges were tagged and the femoral head  was identified.       Osteophytes are removed off the superior acetabulum.  The femoral neck was then cut in situ with an oscillating saw. Traction  was then applied to the left lower extremity utilizing the [facility]  traction. The femoral head was then removed. Retractors were placed  around the acetabulum and then circumferential removal of the labrum was  performed. Osteophytes were also removed. Reaming starts at 45 mm to  medialize and  Increased in 2 mm increments to 49 mm. We reamed in  approximately 40 degrees of abduction, 20 degrees anteversion. A 50 mm  pinnacle acetabular shell was then impacted in anatomic position under  fluoroscopic guidance with excellent purchase. We did not need to place  any additional dome screws. A 32 mm neutral + 4 marathon liner was then  placed into the acetabular shell.       The femoral lift was then placed along the lateral aspect of the femur  just distal to the vastus ridge. The leg was  externally rotated and capsule  was stripped off the inferior aspect of the femoral neck down to the  level of the lesser trochanter, this was done with electrocautery. The femur was lifted after this was performed. The  leg was then placed and extended in adducted position to essentially delivering the femur. We also removed the capsule superiorly and the  piriformis from the piriformis fossa to  gain excellent exposure of the  proximal femur. Rongeur was used to remove some cancellous bone to get  into the lateral portion of the proximal femur for placement of the  initial starter reamer. The starter broaches was placed  the starter broach  and was shown to go down the center of the canal. Broaching  with the  Corail system was then performed starting at size 8, coursing  Up to size 13. A size 13 had excellent torsional and rotational  and axial stability. The trial high offset neck was then placed  with a 32 + 1 trial head.  The hip was then reduced. We confirmed that  the stem was in the canal both on AP and lateral x-rays. It also has excellent sizing. The hip was reduced with outstanding stability through full extension, full external rotation,  and then flexion in adduction internal rotation. AP pelvis was taken  and the leg lengths were measured and found to be exactly equal. Hip  was then dislocated again and the femoral head and neck removed. The  femoral broach was removed. Size 13 Corail stem with a high offset  neck was then impacted into the femur following native anteversion. Has  excellent purchase in the canal. Excellent torsional and rotational and  axial stability. It is confirmed to be in the canal on AP and lateral  fluoroscopic views. The 32 + 1 ceramic head was placed and the hip  reduced with outstanding stability. Again AP pelvis was taken and it  confirmed that the leg lengths were equal. The wound was then copiously  irrigated with saline solution and the capsule reattached and repaired  with Ethibond suture.  30 ml of .25% Bupivicaine injected into the capsule and into the edge of the tensor fascia lata as well as subcutaneous tissue. The fascia overlying the tensor fascia lata was  then closed with a running #1 V-Loc. Subcu was closed with interrupted  2-0 Vicryl and subcuticular running 4-0 Monocryl. Incision was cleaned  and dried. Steri-Strips and a bulky sterile dressing applied. Hemovac  drain was hooked to suction and then he was awakened and transported to  recovery in stable condition.        Please note that a surgical assistant was a medical necessity for this procedure to perform it in a safe and expeditious manner. Assistant was necessary to provide appropriate retraction of vital neurovascular structures and to prevent femoral fracture and allow for anatomic placement of the prosthesis.  Ollen Gross, M.D.

## 2015-06-16 NOTE — Anesthesia Postprocedure Evaluation (Signed)
  Anesthesia Post-op Note  Patient: Heather Nielsen  Procedure(s) Performed: Procedure(s) (LRB): RIGHT TOTAL HIP ARTHROPLASTY ANTERIOR APPROACH (Right)  Patient Location: PACU  Anesthesia Type: Spinal  Level of Consciousness: awake and alert   Airway and Oxygen Therapy: Patient Spontanous Breathing  Post-op Pain: mild  Post-op Assessment: Post-op Vital signs reviewed, Patient's Cardiovascular Status Stable, Respiratory Function Stable, Patent Airway and No signs of Nausea or vomiting  Last Vitals:  Filed Vitals:   06/16/15 1530  BP: 129/57  Pulse: 72  Temp: 36.6 C  Resp: 14    Post-op Vital Signs: stable   Complications: No apparent anesthesia complications

## 2015-06-16 NOTE — H&P (View-Only) (Signed)
Heather Nielsen DOB: 11/21/1934 Married / Language: English / Race: White Female Date of Admission:  06-16-2015 CC:  Right Hip Pain History of Present Illness The patient is a 81 year old female who comes in for a preoperative History and Physical. The patient is scheduled for a right total hip arthroplasty (anterior) to be performed by Dr. Frank V. [name], MD at [facility] on 06-16-2015. The patient is a 80 year old female who is being followed for their right hip pain and osteoarthritis. The patient has reported only temporary improvement of their symptoms with: Cortisone injections (the last IA injection helped a lot). The cortisone injection did help her quite a bit. She said that the intense episodes of pain have subsided in the past with previous injections but does return. She has advanced arthritis. She has not had any significant progression of her arthritis. It is advanced and end stage now. She is ready to have a hip replacement at this time. They have been treated conservatively in the past for the above stated problem and despite conservative measures, they continue to have progressive pain and severe functional limitations and dysfunction. They have failed non-operative management including home exercise, medications, and injections. It is felt that they would benefit from undergoing total joint replacement. Risks and benefits of the procedure have been discussed with the patient and they elect to proceed with surgery. There are no active contraindications to surgery such as ongoing infection or rapidly progressive neurological disease.  Problem List/Past Medical Primary osteoarthritis of right hip (M16.11) Osteoporosis Osteoarthritis  Allergies  Pneumococcal 7-Val Conj Vacc *VACCINES* Penicillin  Family History Heart Disease Brother, Mother. Kidney disease Father. Cerebrovascular Accident Mother. First Degree Relatives reported Heart disease in female family  member before age 65 Hypertension Mother.  Social History  Tobacco use Former smoker. 09/08/2014: smoke(d) less than 1/2 pack(s) per day Tobacco / smoke exposure 09/08/2014: no Marital status married Not under pain contract No history of drug/alcohol rehab Number of flights of stairs before winded greater than 5 Living situation live with spouse Exercise Exercises daily; does running / walking and individual sport Current drinker 09/08/2014: Currently drinks wine and hard liquor 8-14 times per week Children 5 or more Current work status retired  Medication History  No Current Medications  Past Surgical History Arthroscopy of Knee right Arthroscopy of Shoulder right  Review of Systems  General Not Present- Chills, Fatigue, Fever, Memory Loss, Night Sweats, Weight Gain and Weight Loss. Skin Not Present- Eczema, Hives, Itching, Lesions and Rash. HEENT Not Present- Dentures, Double Vision, Headache, Hearing Loss, Tinnitus and Visual Loss. Respiratory Not Present- Allergies, Chronic Cough, Coughing up blood, Shortness of breath at rest and Shortness of breath with exertion. Cardiovascular Not Present- Chest Pain, Difficulty Breathing Lying Down, Murmur, Palpitations, Racing/skipping heartbeats and Swelling. Gastrointestinal Not Present- Abdominal Pain, Bloody Stool, Constipation, Diarrhea, Difficulty Swallowing, Heartburn, Jaundice, Loss of appetitie, Nausea and Vomiting. Female Genitourinary Present- Incontinence. Not Present- Blood in Urine, Discharge, Flank Pain, Painful Urination, Urgency, Urinary frequency, Urinary Retention, Urinating at Night and Weak urinary stream. Musculoskeletal Present- Joint Pain and Joint Swelling. Not Present- Back Pain, Morning Stiffness, Muscle Pain, Muscle Weakness and Spasms. Neurological Not Present- Blackout spells, Difficulty with balance, Dizziness, Paralysis, Tremor and Weakness. Psychiatric Not Present- Insomnia.  Vital Pulse:  64 (Regular)  BP: 154/74 (Sitting, Right Arm, Standard  Physical Exam General Mental Status -Alert, cooperative and good historian. General Appearance-pleasant, Not in acute distress. Orientation-Oriented X3. Build & Nutrition-Lean, Petite, Well nourished   and Well developed.  Head and Neck Head-normocephalic, atraumatic . Neck Global Assessment - supple, no bruit auscultated on the right, no bruit auscultated on the left.  Eye Pupil - Bilateral-Regular and Round. Motion - Bilateral-EOMI.  Chest and Lung Exam Auscultation Breath sounds - clear at anterior chest wall and clear at posterior chest wall. Adventitious sounds - No Adventitious sounds.  Cardiovascular Auscultation Rhythm - Regular rate and rhythm. Heart Sounds - S1 WNL and S2 WNL. Murmurs & Other Heart Sounds - Auscultation of the heart reveals - No Murmurs.  Abdomen Palpation/Percussion Tenderness - Abdomen is non-tender to palpation. Rigidity (guarding) - Abdomen is soft. Auscultation Auscultation of the abdomen reveals - Bowel sounds normal.  Female Genitourinary Note: Not done, not pertinent to present illness   Musculoskeletal Note: On exam, she is alert and oriented in no apparent distress. Evaluation of her right hip shows flexion about 90, about 10 degrees internal rotation and 20 external rotation and 20 abduction. Gait pattern is slightly antalgic.  RADIOGRAPHS: AP pelvis and lateral of the right hip shows severe end stage arthritis of the right hip with bone on bone with significant subchondral cystic formation.  Assessment & Plan Primary osteoarthritis of right hip (M16.11) Note:Surgical Plans: Right Total Hip Replacement - Anterior Approach  Disposition: Home  PCP: Dr. Audley HoseKoller  IV TXA  Anesthesia Issues: Postop nausea following knee procedure in the past.  Signed electronically by Beckey RutterAlezandrew L Perkins, III PA-C

## 2015-06-16 NOTE — Interval H&P Note (Signed)
History and Physical Interval Note:  06/16/2015 9:43 AM  Heather Nielsen  has presented today for surgery, with the diagnosis of OA OF RIGHT HIP  The various methods of treatment have been discussed with the patient and family. After consideration of risks, benefits and other options for treatment, the patient has consented to  Procedure(s): RIGHT TOTAL HIP ARTHROPLASTY ANTERIOR APPROACH (Right) as a surgical intervention .  The patient's history has been reviewed, patient examined, no change in status, stable for surgery.  I have reviewed the patient's chart and labs.  Questions were answered to the patient's satisfaction.     Loanne DrillingALUISIO,Ammi Hutt V

## 2015-06-16 NOTE — Anesthesia Preprocedure Evaluation (Signed)
Anesthesia Evaluation  Patient identified by MRN, date of birth, ID band Patient awake    Reviewed: Allergy & Precautions, NPO status , Patient's Chart, lab work & pertinent test results  History of Anesthesia Complications (+) PONV  Airway Mallampati: II  TM Distance: >3 FB Neck ROM: Full    Dental no notable dental hx.    Pulmonary former smoker,  breath sounds clear to auscultation  Pulmonary exam normal       Cardiovascular negative cardio ROS Normal cardiovascular examRhythm:Regular Rate:Normal     Neuro/Psych negative neurological ROS  negative psych ROS   GI/Hepatic negative GI ROS, Neg liver ROS,   Endo/Other  negative endocrine ROS  Renal/GU negative Renal ROS  negative genitourinary   Musculoskeletal negative musculoskeletal ROS (+)   Abdominal   Peds negative pediatric ROS (+)  Hematology negative hematology ROS (+)   Anesthesia Other Findings   Reproductive/Obstetrics negative OB ROS                             Anesthesia Physical Anesthesia Plan  ASA: I  Anesthesia Plan: General and Spinal   Post-op Pain Management:    Induction: Intravenous  Airway Management Planned: Simple Face Mask  Additional Equipment:   Intra-op Plan:   Post-operative Plan:   Informed Consent: I have reviewed the patients History and Physical, chart, labs and discussed the procedure including the risks, benefits and alternatives for the proposed anesthesia with the patient or authorized representative who has indicated his/her understanding and acceptance.   Dental advisory given  Plan Discussed with: CRNA  Anesthesia Plan Comments:         Anesthesia Quick Evaluation

## 2015-06-16 NOTE — Evaluation (Signed)
Physical Therapy Evaluation Patient Details Name: Heather Nielsen MRN: [id number] DOB: 12/28/1933 Today's Date: 06/16/2015   History of Present Illness  R THR  Clinical Impression  Pt s/p R THR presents with decreased R LE strength/ROM and post op pain limiting functional mobility.  Pt should progress well to dc home with family assist and HHPT follow up.    Follow Up Recommendations Home health PT    Equipment Recommendations  Rolling walker with 5" wheels    Recommendations for Other Services OT consult     Precautions / Restrictions Precautions Precautions: Fall Restrictions Weight Bearing Restrictions: No Other Position/Activity Restrictions: WBAT      Mobility  Bed Mobility Overal bed mobility: Needs Assistance Bed Mobility: Supine to Sit     Supine to sit: Min assist     General bed mobility comments: cues for sequence and use of L LE to self assist  Transfers Overall transfer level: Needs assistance Equipment used: Rolling walker (2 wheeled) Transfers: Sit to/from Stand Sit to Stand: Min assist         General transfer comment: cues for LE management and use of UEs to self assist  Ambulation/Gait Ambulation/Gait assistance: Min assist Ambulation Distance (Feet): 84 Feet Assistive device: Rolling walker (2 wheeled) Gait Pattern/deviations: Step-to pattern;Step-through pattern;Decreased step length - right;Decreased step length - left;Shuffle;Trunk flexed     General Gait Details: cues for posture, position from RW and initial sequence  Stairs            Wheelchair Mobility    Modified Rankin (Stroke Patients Only)       Balance                                             Pertinent Vitals/Pain Pain Assessment: 0-10 Pain Score: 4  Pain Location: R hip Pain Descriptors / Indicators: Aching;Burning Pain Intervention(s): Limited activity within patient's tolerance;Monitored during session;Premedicated before session;Ice  applied    Home Living Family/patient expects to be discharged to:: Private residence Living Arrangements: Spouse/significant other Available Help at Discharge: Family Type of Home: House Home Access: Stairs to enter   Secretary/administratorntrance Stairs-Number of Steps: 2 Home Layout: One level Home Equipment: None      Prior Function Level of Independence: Independent               Hand Dominance        Extremity/Trunk Assessment   Upper Extremity Assessment: Overall WFL for tasks assessed           Lower Extremity Assessment: RLE deficits/detail      Cervical / Trunk Assessment: Normal  Communication   Communication: No difficulties  Cognition Arousal/Alertness: Awake/alert Behavior During Therapy: WFL for tasks assessed/performed Overall Cognitive Status: Within Functional Limits for tasks assessed                      General Comments      Exercises        Assessment/Plan    PT Assessment Patient needs continued PT services  PT Diagnosis Difficulty walking   PT Problem List Decreased strength;Decreased range of motion;Decreased activity tolerance;Decreased mobility;Decreased knowledge of use of DME;Pain;Decreased knowledge of precautions  PT Treatment Interventions DME instruction;Gait training;Stair training;Functional mobility training;Therapeutic activities;Therapeutic exercise;Patient/family education   PT Goals (Current goals can be found in the Care Plan section) Acute Rehab PT Goals Patient Stated  Goal: Resume previous lifestyle with decreased pain PT Goal Formulation: With patient Time For Goal Achievement: 06/19/15 Potential to Achieve Goals: Good    Frequency 7X/week   Barriers to discharge        Co-evaluation               End of Session Equipment Utilized During Treatment: Gait belt Activity Tolerance: Patient tolerated treatment well Patient left: in chair;with call bell/phone within reach Nurse Communication: Mobility  status         Time: 1027-2536 PT Time Calculation (min) (ACUTE ONLY): 35 min   Charges:   PT Evaluation $Initial PT Evaluation Tier I: 1 Procedure PT Treatments $Gait Training: 8-22 mins   PT G Codes:        Sharlotte Baka 2015/06/20, 5:36 PM

## 2015-06-16 NOTE — Transfer of Care (Signed)
Immediate Anesthesia Transfer of Care Note  Patient: Heather Nielsen  Procedure(s) Performed: Procedure(s): RIGHT TOTAL HIP ARTHROPLASTY ANTERIOR APPROACH (Right)  Patient Location: PACU  Anesthesia Type:SpinalT11  Level of Consciousness:  sedated, patient cooperative and responds to stimulation  Airway & Oxygen Therapy:Patient Spontanous Breathing and Patient connected to face mask oxgen  Post-op Assessment:  Report given to PACU RN and Post -op Vital signs reviewed and stable  Post vital signs:  Reviewed and stable  Last Vitals:  Filed Vitals:   06/16/15 0727  BP: 160/73  Pulse: 86  Temp: 36.8 C  Resp: 18    Complications: No apparent anesthesia complications

## 2015-06-17 ENCOUNTER — Encounter (HOSPITAL_COMMUNITY): Payer: Self-pay | Admitting: Orthopedic Surgery

## 2015-06-17 LAB — CBC
HCT: 32.6 % — ABNORMAL LOW (ref 36.0–46.0)
HEMOGLOBIN: 11.8 g/dL — AB (ref 12.0–15.0)
MCH: 32.1 pg (ref 26.0–34.0)
MCHC: 36.2 g/dL — ABNORMAL HIGH (ref 30.0–36.0)
MCV: 88.6 fL (ref 78.0–100.0)
PLATELETS: 222 10*3/uL (ref 150–400)
RBC: 3.68 MIL/uL — ABNORMAL LOW (ref 3.87–5.11)
RDW: 12.2 % (ref 11.5–15.5)
WBC: 13.8 10*3/uL — ABNORMAL HIGH (ref 4.0–10.5)

## 2015-06-17 LAB — BASIC METABOLIC PANEL
ANION GAP: 8 (ref 5–15)
BUN: 7 mg/dL (ref 6–20)
CALCIUM: 8.5 mg/dL — AB (ref 8.9–10.3)
CO2: 24 mmol/L (ref 22–32)
Chloride: 100 mmol/L — ABNORMAL LOW (ref 101–111)
Creatinine, Ser: 0.46 mg/dL (ref 0.44–1.00)
GFR calc Af Amer: >60 mL/min (ref >60)
GFR calc non Af Amer: >60 mL/min (ref >60)
Glucose, Bld: 134 mg/dL — ABNORMAL HIGH (ref 65–99)
Potassium: 3.6 mmol/L (ref 3.5–5.1)
SODIUM: 132 mmol/L — AB (ref 135–145)

## 2015-06-17 MED ORDER — METHOCARBAMOL 500 MG PO TABS: 500.0000 mg | ORAL_TABLET | Freq: Four times a day (QID) | ORAL | Status: DC | PRN

## 2015-06-17 MED ORDER — RIVAROXABAN 10 MG PO TABS: 10.0000 mg | ORAL_TABLET | Freq: Every day | ORAL | Status: DC

## 2015-06-17 MED ORDER — TRAMADOL HCL 50 MG PO TABS: 50.0000 mg | ORAL_TABLET | Freq: Four times a day (QID) | ORAL | Status: DC | PRN

## 2015-06-17 MED ORDER — OXYCODONE HCL 5 MG PO TABS: 5.0000 mg | ORAL_TABLET | ORAL | Status: DC | PRN

## 2015-06-17 NOTE — Discharge Summary (Signed)
Physician Discharge Summary   Patient ID: Heather Nielsen MRN: [id number] DOB/AGE: March 12, 1934 81 y.o.  Admit date: 06/16/2015 Discharge date: 06-17-2015  Primary Diagnosis:  Osteoarthritis of the Right hip.   Admission Diagnoses:  Past Medical History  Diagnosis Date  . Arthritis   . Complication of anesthesia   . PONV (postoperative nausea and vomiting)   . Urinary incontinence    Discharge Diagnoses:   Principal Problem:   Osteoarthritis of right hip Active Problems:   OA (osteoarthritis) of hip  Estimated body mass index is 18.83 kg/(m^2) as calculated from the following:   Height as of this encounter: '5\' 2"'  (1.575 m).   Weight as of this encounter: 46.72 kg (103 lb).  Procedure(s) (LRB): RIGHT TOTAL HIP ARTHROPLASTY ANTERIOR APPROACH (Right)   Consults: None  HPI: Heather Nielsen is a 81 y.o. female who has advanced end-  stage arthritis of her Right hip with progressively worsening pain and  dysfunction.The patient has failed nonoperative management and presents for  total hip arthroplasty  Laboratory Data: Admission on 06/16/2015  Component Date Value Ref Range Status  . WBC 06/17/2015 13.8* 4.0 - 10.5 K/uL Final  . RBC 06/17/2015 3.68* 3.87 - 5.11 MIL/uL Final  . Hemoglobin 06/17/2015 11.8* 12.0 - 15.0 g/dL Final  . HCT 06/17/2015 32.6* 36.0 - 46.0 % Final  . MCV 06/17/2015 88.6  78.0 - 100.0 fL Final  . MCH 06/17/2015 32.1  26.0 - 34.0 pg Final  . MCHC 06/17/2015 36.2* 30.0 - 36.0 g/dL Final  . RDW 06/17/2015 12.2  11.5 - 15.5 % Final  . Platelets 06/17/2015 222  150 - 400 K/uL Final  . Sodium 06/17/2015 132* 135 - 145 mmol/L Final  . Potassium 06/17/2015 3.6  3.5 - 5.1 mmol/L Final  . Chloride 06/17/2015 100* 101 - 111 mmol/L Final  . CO2 06/17/2015 24  22 - 32 mmol/L Final  . Glucose, Bld 06/17/2015 134* 65 - 99 mg/dL Final  . BUN 06/17/2015 7  6 - 20 mg/dL Final  . Creatinine, Ser 06/17/2015 0.46  0.44 - 1.00 mg/dL Final  . Calcium 06/17/2015 8.5* 8.9 -  10.3 mg/dL Final  . GFR calc non Af Amer 06/17/2015 >60  >60 mL/min Final  . GFR calc Af Amer 06/17/2015 >60  >60 mL/min Final   Comment: (NOTE) The eGFR has been calculated using the CKD EPI equation. This calculation has not been validated in all clinical situations. eGFR's persistently <60 mL/min signify possible Chronic Kidney Disease.   Georgiann Hahn gap 06/17/2015 8  5 - 15 Final  Hospital Outpatient Visit on 06/10/2015  Component Date Value Ref Range Status  . aPTT 06/10/2015 27  24 - 37 seconds Final  . WBC 06/10/2015 6.1  4.0 - 10.5 K/uL Final  . RBC 06/10/2015 4.72  3.87 - 5.11 MIL/uL Final  . Hemoglobin 06/10/2015 14.6  12.0 - 15.0 g/dL Final  . HCT 06/10/2015 42.5  36.0 - 46.0 % Final  . MCV 06/10/2015 90.0  78.0 - 100.0 fL Final  . MCH 06/10/2015 30.9  26.0 - 34.0 pg Final  . MCHC 06/10/2015 34.4  30.0 - 36.0 g/dL Final  . RDW 06/10/2015 12.5  11.5 - 15.5 % Final  . Platelets 06/10/2015 281  150 - 400 K/uL Final  . Sodium 06/10/2015 135  135 - 145 mmol/L Final  . Potassium 06/10/2015 4.1  3.5 - 5.1 mmol/L Final  . Chloride 06/10/2015 98* 101 - 111 mmol/L Final  . CO2 06/10/2015 29  22 - 32 mmol/L Final  . Glucose, Bld 06/10/2015 90  65 - 99 mg/dL Final  . BUN 06/10/2015 12  6 - 20 mg/dL Final  . Creatinine, Ser 06/10/2015 0.59  0.44 - 1.00 mg/dL Final  . Calcium 06/10/2015 9.2  8.9 - 10.3 mg/dL Final  . Total Protein 06/10/2015 7.3  6.5 - 8.1 g/dL Final  . Albumin 06/10/2015 4.3  3.5 - 5.0 g/dL Final  . AST 06/10/2015 23  15 - 41 U/L Final  . ALT 06/10/2015 14  14 - 54 U/L Final  . Alkaline Phosphatase 06/10/2015 84  38 - 126 U/L Final  . Total Bilirubin 06/10/2015 0.5  0.3 - 1.2 mg/dL Final  . GFR calc non Af Amer 06/10/2015 >60  >60 mL/min Final  . GFR calc Af Amer 06/10/2015 >60  >60 mL/min Final   Comment: (NOTE) The eGFR has been calculated using the CKD EPI equation. This calculation has not been validated in all clinical situations. eGFR's persistently <60  mL/min signify possible Chronic Kidney Disease.   . Anion gap 06/10/2015 8  5 - 15 Final  . Prothrombin Time 06/10/2015 13.3  11.6 - 15.2 seconds Final  . INR 06/10/2015 0.99  0.00 - 1.49 Final  . ABO/RH(D) 06/10/2015 B POS   Final  . Antibody Screen 06/10/2015 NEG   Final  . Sample Expiration 06/10/2015 06/19/2015   Final  . Color, Urine 06/10/2015 YELLOW  YELLOW Final  . APPearance 06/10/2015 CLOUDY* CLEAR Final  . Specific Gravity, Urine 06/10/2015 1.020  1.005 - 1.030 Final  . pH 06/10/2015 6.5  5.0 - 8.0 Final  . Glucose, UA 06/10/2015 NEGATIVE  NEGATIVE mg/dL Final  . Hgb urine dipstick 06/10/2015 SMALL* NEGATIVE Final  . Bilirubin Urine 06/10/2015 NEGATIVE  NEGATIVE Final  . Ketones, ur 06/10/2015 NEGATIVE  NEGATIVE mg/dL Final  . Protein, ur 06/10/2015 NEGATIVE  NEGATIVE mg/dL Final  . Urobilinogen, UA 06/10/2015 1.0  0.0 - 1.0 mg/dL Final  . Nitrite 06/10/2015 NEGATIVE  NEGATIVE Final  . Leukocytes, UA 06/10/2015 TRACE* NEGATIVE Final  . MRSA, PCR 06/10/2015 NEGATIVE  NEGATIVE Final  . Staphylococcus aureus 06/10/2015 NEGATIVE  NEGATIVE Final   Comment:        The Xpert SA Assay (FDA approved for NASAL specimens in patients over 88 years of age), is one component of a comprehensive surveillance program.  Test performance has been validated by [facility] for patients greater than or equal to 31 year old. It is not intended to diagnose infection nor to guide or monitor treatment.   . Squamous Epithelial / LPF 06/10/2015 RARE  RARE Final  . WBC, UA 06/10/2015 0-2  <3 WBC/hpf Final  . RBC / HPF 06/10/2015 3-6  <3 RBC/hpf Final  . Bacteria, UA 06/10/2015 FEW* RARE Final  . Urine-Other 06/10/2015 MUCOUS PRESENT   Final  . ABO/RH(D) 06/10/2015 B POS   Final     X-Rays:Dg Pelvis Portable  06/16/2015   CLINICAL DATA:  Right hip replacement  EXAM: PORTABLE PELVIS 1-2 VIEWS  COMPARISON:  None.  FINDINGS: Interval right total hip arthroplasty without failure or  complication. No fracture or dislocation. Postsurgical changes in the soft tissues surrounding the hip. Surgical drain in place.  Severe osteoarthritis of the left hip with severe axial joint space narrowing.  IMPRESSION: Interval, right total hip arthroplasty.   Electronically Signed   By: Kathreen Devoid   On: 06/16/2015 11:55   Dg C-arm 1-60 Min-no Report  06/16/2015   CLINICAL  DATA: SURGERY   C-ARM 1-60 MINUTES  Fluoroscopy was utilized by the requesting physician.  No radiographic  interpretation.     EKG:No orders found for this or any previous visit.   Hospital Course: Patient was admitted to [facility] and taken to the OR and underwent the above state procedure without complications.  Patient tolerated the procedure well and was later transferred to the recovery room and then to the orthopaedic floor for postoperative care.  They were given PO and IV analgesics for pain control following their surgery.  They were given 24 hours of postoperative antibiotics of  Anti-infectives    Start     Dose/Rate Route Frequency Ordered Stop   06/16/15 2100  vancomycin (VANCOCIN) IVPB 1000 mg/200 mL premix     1,000 mg 200 mL/hr over 60 Minutes Intravenous Every 12 hours 06/16/15 1343 06/16/15 2207   06/16/15 0747  vancomycin (VANCOCIN) IVPB 1000 mg/200 mL premix     1,000 mg 200 mL/hr over 60 Minutes Intravenous On call to O.R. 06/16/15 4765 06/16/15 0937     and started on DVT prophylaxis in the form of Xarelto.   PT and OT were ordered for total hip protocol.  The patient was allowed to be WBAT with therapy. Discharge planning was consulted to help with postop disposition and equipment needs.  Patient had a good night on the evening of surgery.  They started to get up OOB with therapy on day one.  Hemovac drain was pulled without difficulty.   Patient was seen in rounds on day one with Dr. Wynelle Link and it was felt that she was ready to go home.  Planned home following therapy.  Diet: Regular  diet Activity:WBAT Follow-up:in 2 weeks Disposition - Home Discharged Condition: good       Discharge Instructions    Call MD / Call 911    Complete by:  As directed   If you experience chest pain or shortness of breath, CALL 911 and be transported to the hospital emergency room.  If you develope a fever above 101 F, pus (white drainage) or increased drainage or redness at the wound, or calf pain, call your surgeon's office.     Change dressing    Complete by:  As directed   You may change your dressing dressing daily with sterile 4 x 4 inch gauze dressing and paper tape.  Do not submerge the incision under water.     Constipation Prevention    Complete by:  As directed   Drink plenty of fluids.  Prune juice may be helpful.  You may use a stool softener, such as Colace (over the counter) 100 mg twice a day.  Use MiraLax (over the counter) for constipation as needed.     Diet - low sodium heart healthy    Complete by:  As directed      Discharge instructions    Complete by:  As directed   Pick up stool softner and laxative for home use following surgery while on pain medications. Do not submerge incision under water. Please use good hand washing techniques while changing dressing each day. May shower starting three days after surgery. Please use a clean towel to pat the incision dry following showers. Continue to use ice for pain and swelling after surgery. Do not use any lotions or creams on the incision until instructed by your surgeon.  Total Hip Protocol.  Take Xarelto for two and a half more weeks, then discontinue Xarelto. Once  the patient has completed the blood thinner regimen, then take a Baby 81 mg Aspirin daily for three more weeks.  Postoperative Constipation Protocol  Constipation - defined medically as fewer than three stools per week and severe constipation as less than one stool per week.  One of the most common issues patients have following surgery is  constipation.  Even if you have a regular bowel pattern at home, your normal regimen is likely to be disrupted due to multiple reasons following surgery.  Combination of anesthesia, postoperative narcotics, change in appetite and fluid intake all can affect your bowels.  In order to avoid complications following surgery, here are some recommendations in order to help you during your recovery period.  Colace (docusate) - Pick up an over-the-counter form of Colace or another stool softener and take twice a day as long as you are requiring postoperative pain medications.  Take with a full glass of water daily.  If you experience loose stools or diarrhea, hold the colace until you stool forms back up.  If your symptoms do not get better within 1 week or if they get worse, check with your doctor.  Dulcolax (bisacodyl) - Pick up over-the-counter and take as directed by the product packaging as needed to assist with the movement of your bowels.  Take with a full glass of water.  Use this product as needed if not relieved by Colace only.   MiraLax (polyethylene glycol) - Pick up over-the-counter to have on hand.  MiraLax is a solution that will increase the amount of water in your bowels to assist with bowel movements.  Take as directed and can mix with a glass of water, juice, soda, coffee, or tea.  Take if you go more than two days without a movement. Do not use MiraLax more than once per day. Call your doctor if you are still constipated or irregular after using this medication for 7 days in a row.  If you continue to have problems with postoperative constipation, please contact the office for further assistance and recommendations.  If you experience "the worst abdominal pain ever" or develop nausea or vomiting, please contact the office immediatly for further recommendations for treatment.     Do not sit on low chairs, stoools or toilet seats, as it may be difficult to get up from low surfaces    Complete  by:  As directed      Driving restrictions    Complete by:  As directed   No driving until released by the physician.     Increase activity slowly as tolerated    Complete by:  As directed      Lifting restrictions    Complete by:  As directed   No lifting until released by the physician.     Patient may shower    Complete by:  As directed   You may shower without a dressing once there is no drainage.  Do not wash over the wound.  If drainage remains, do not shower until drainage stops.     TED hose    Complete by:  As directed   Use stockings (TED hose) for 3 weeks on both leg(s).  You may remove them at night for sleeping.     Weight bearing as tolerated    Complete by:  As directed   Laterality:  right  Extremity:  Lower            Medication List    TAKE these medications  methocarbamol 500 MG tablet  Commonly known as:  ROBAXIN  Take 1 tablet (500 mg total) by mouth every 6 (six) hours as needed for muscle spasms.     oxyCODONE 5 MG immediate release tablet  Commonly known as:  Oxy IR/ROXICODONE  Take 1-2 tablets (5-10 mg total) by mouth every 3 (three) hours as needed for moderate pain or severe pain.     pravastatin 20 MG tablet  Commonly known as:  PRAVACHOL  Take 1 tablet (20 mg total) by mouth daily.     rivaroxaban 10 MG Tabs tablet  Commonly known as:  XARELTO  Take 1 tablet (10 mg total) by mouth daily with breakfast. Take Xarelto for two and a half more weeks, then discontinue Xarelto. Once the patient has completed the blood thinner regimen, then take a Baby 81 mg Aspirin daily for three more weeks.     traMADol 50 MG tablet  Commonly known as:  ULTRAM  Take 1-2 tablets (50-100 mg total) by mouth every 6 (six) hours as needed (mild pain).       Follow-up Information    Follow up with Gearlean Alf, MD. Schedule an appointment as soon as possible for a visit on 06/29/2015.   Specialty:  Orthopedic Surgery   Why:  Call office at [phone] to  setup appointment on Tuesday 7/12 with Dr. Wynelle Link.   Contact information:   [address] [place] [address] [phone]       Signed: Arlee Muslim, PA-C Orthopaedic Surgery 06/17/2015, 9:03 AM

## 2015-06-17 NOTE — Progress Notes (Signed)
Pt d/c'd to home with daughter.  Pt and daughter educated about medications, dressing changes, after care, and follow-up appointments.  Prescriptions and paperwork given to pt.  Pt verbalized understanding of teaching.

## 2015-06-17 NOTE — Discharge Instructions (Addendum)
° °Dr. Frank [name] °Total Joint Specialist °[facility] °[address] °[place], [place] [address] °[phone] ° °ANTERIOR APPROACH TOTAL HIP REPLACEMENT POSTOPERATIVE DIRECTIONS ° ° °Hip Rehabilitation, Guidelines Following Surgery  °The results of a hip operation are greatly improved after range of motion and muscle strengthening exercises. Follow all safety measures which are given to protect your hip. If any of these exercises cause increased pain or swelling in your joint, decrease the amount until you are comfortable again. Then slowly increase the exercises. Call your caregiver if you have problems or questions.  ° °HOME CARE INSTRUCTIONS  °Remove items at home which could result in a fall. This includes throw rugs or furniture in walking pathways.  °· ICE to the affected hip every three hours for 30 minutes at a time and then as needed for pain and swelling.  Continue to use ice on the hip for pain and swelling from surgery. You may notice swelling that will progress down to the foot and ankle.  This is normal after surgery.  Elevate the leg when you are not up walking on it.   °· Continue to use the breathing machine which will help keep your temperature down.  It is common for your temperature to cycle up and down following surgery, especially at night when you are not up moving around and exerting yourself.  The breathing machine keeps your lungs expanded and your temperature down. °· Do not place pillow under knee, focus on keeping the knee straight while resting ° °DIET °You may resume your previous home diet once your are discharged from the hospital. ° °DRESSING / WOUND CARE / SHOWERING °You may shower 3 days after surgery, but keep the wounds dry during showering.  You may use an occlusive plastic wrap (Press'n Seal for example), NO SOAKING/SUBMERGING IN THE BATHTUB.  If the bandage gets wet, change with a clean dry gauze.  If the incision gets wet, pat the wound dry with  a clean towel. °You may start showering once you are discharged home but do not submerge the incision under water. Just pat the incision dry and apply a dry gauze dressing on daily. °Change the surgical dressing daily and reapply a dry dressing each time. ° °ACTIVITY °Walk with your walker as instructed. °Use walker as long as suggested by your caregivers. °Avoid periods of inactivity such as sitting longer than an hour when not asleep. This helps prevent blood clots.  °You may resume a sexual relationship in one month or when given the OK by your doctor.  °You may return to work once you are cleared by your doctor.  °Do not drive a car for 6 weeks or until released by you surgeon.  °Do not drive while taking narcotics. ° °WEIGHT BEARING °Weight bearing as tolerated with assist device (walker, cane, etc) as directed, use it as long as suggested by your surgeon or therapist, typically at least 4-6 weeks. ° °POSTOPERATIVE CONSTIPATION PROTOCOL °Constipation - defined medically as fewer than three stools per week and severe constipation as less than one stool per week. ° °One of the most common issues patients have following surgery is constipation.  Even if you have a regular bowel pattern at home, your normal regimen is likely to be disrupted due to multiple reasons following surgery.  Combination of anesthesia, postoperative narcotics, change in appetite and fluid intake all can affect your bowels.  In order to avoid complications following surgery, here are some recommendations in order to   help you during your recovery period.  Colace (docusate) - Pick up an over-the-counter form of Colace or another stool softener and take twice a day as long as you are requiring postoperative pain medications.  Take with a full glass of water daily.  If you experience loose stools or diarrhea, hold the colace until you stool forms back up.  If your symptoms do not get better within 1 week or if they get worse, check with your  doctor.  Dulcolax (bisacodyl) - Pick up over-the-counter and take as directed by the product packaging as needed to assist with the movement of your bowels.  Take with a full glass of water.  Use this product as needed if not relieved by Colace only.   MiraLax (polyethylene glycol) - Pick up over-the-counter to have on hand.  MiraLax is a solution that will increase the amount of water in your bowels to assist with bowel movements.  Take as directed and can mix with a glass of water, juice, soda, coffee, or tea.  Take if you go more than two days without a movement. Do not use MiraLax more than once per day. Call your doctor if you are still constipated or irregular after using this medication for 7 days in a row.  If you continue to have problems with postoperative constipation, please contact the office for further assistance and recommendations.  If you experience "the worst abdominal pain ever" or develop nausea or vomiting, please contact the office immediatly for further recommendations for treatment.  ITCHING  If you experience itching with your medications, try taking only a single pain pill, or even half a pain pill at a time.  You can also use Benadryl over the counter for itching or also to help with sleep.   TED HOSE STOCKINGS Wear the elastic stockings on both legs for three weeks following surgery during the day but you may remove then at night for sleeping.  MEDICATIONS See your medication summary on the After Visit Summary that the nursing staff will review with you prior to discharge.  You may have some home medications which will be placed on hold until you complete the course of blood thinner medication.  It is important for you to complete the blood thinner medication as prescribed by your surgeon.  Continue your approved medications as instructed at time of discharge.  PRECAUTIONS If you experience chest pain or shortness of breath - call 911 immediately for transfer to the  hospital emergency department.  If you develop a fever greater that 101 F, purulent drainage from wound, increased redness or drainage from wound, foul odor from the wound/dressing, or calf pain - CONTACT YOUR SURGEON.                                                   FOLLOW-UP APPOINTMENTS Make sure you keep all of your appointments after your operation with your surgeon and caregivers. You should call the office at the above phone number and make an appointment for approximately two weeks after the date of your surgery or on the date instructed by your surgeon outlined in the "After Visit Summary".  RANGE OF MOTION AND STRENGTHENING EXERCISES  These exercises are designed to help you keep full movement of your hip joint. Follow your caregiver's or physical therapist's instructions. Perform all exercises about fifteen  times, three times per day or as directed. Exercise both hips, even if you have had only one joint replacement. These exercises can be done on a training (exercise) mat, on the floor, on a table or on a bed. Use whatever works the best and is most comfortable for you. Use music or television while you are exercising so that the exercises are a pleasant break in your day. This will make your life better with the exercises acting as a break in routine you can look forward to.  Lying on your back, slowly slide your foot toward your buttocks, raising your knee up off the floor. Then slowly slide your foot back down until your leg is straight again.  Lying on your back spread your legs as far apart as you can without causing discomfort.  Lying on your side, raise your upper leg and foot straight up from the floor as far as is comfortable. Slowly lower the leg and repeat.  Lying on your back, tighten up the muscle in the front of your thigh (quadriceps muscles). You can do this by keeping your leg straight and trying to raise your heel off the floor. This helps strengthen the largest muscle  supporting your knee.  Lying on your back, tighten up the muscles of your buttocks both with the legs straight and with the knee bent at a comfortable angle while keeping your heel on the floor.   IF YOU ARE TRANSFERRED TO A SKILLED REHAB FACILITY If the patient is transferred to a skilled rehab facility following release from the hospital, a list of the current medications will be sent to the facility for the patient to continue.  When discharged from the skilled rehab facility, please have the facility set up the patient's Home Health Physical Therapy prior to being released. Also, the skilled facility will be responsible for providing the patient with their medications at time of release from the facility to include their pain medication, the muscle relaxants, and their blood thinner medication. If the patient is still at the rehab facility at time of the two week follow up appointment, the skilled rehab facility will also need to assist the patient in arranging follow up appointment in our office and any transportation needs.  MAKE SURE YOU:  Understand these instructions.  Get help right away if you are not doing well or get worse.    Pick up stool softner and laxative for home use following surgery while on pain medications. Do not submerge incision under water. Please use good hand washing techniques while changing dressing each day. May shower starting three days after surgery. Please use a clean towel to pat the incision dry following showers. Continue to use ice for pain and swelling after surgery. Do not use any lotions or creams on the incision until instructed by your surgeon.  Take Xarelto for two and a half more weeks, then discontinue Xarelto. Once the patient has completed the blood thinner regimen, then take a Baby 81 mg Aspirin daily for three more weeks.  Information on my medicine - XARELTO (Rivaroxaban)  This medication education was reviewed with me or my healthcare  representative as part of my discharge preparation.  The pharmacist that spoke with me during my hospital stay was:  Reece Packerickering, Latifah Padin Robert, [facility]  Why was Xarelto prescribed for you? Xarelto was prescribed for you to reduce the risk of blood clots forming after orthopedic surgery. The medical term for these abnormal blood clots is venous thromboembolism (VTE).  What do you need to know about xarelto ? Take your Xarelto ONCE DAILY at the same time every day. You may take it either with or without food.  If you have difficulty swallowing the tablet whole, you may crush it and mix in applesauce just prior to taking your dose.  Take Xarelto exactly as prescribed by your doctor and DO NOT stop taking Xarelto without talking to the doctor who prescribed the medication.  Stopping without other VTE prevention medication to take the place of Xarelto may increase your risk of developing a clot.  After discharge, you should have regular check-up appointments with your healthcare provider that is prescribing your Xarelto.    What do you do if you miss a dose? If you miss a dose, take it as soon as you remember on the same day then continue your regularly scheduled once daily regimen the next day. Do not take two doses of Xarelto on the same day.   Important Safety Information A possible side effect of Xarelto is bleeding. You should call your healthcare provider right away if you experience any of the following: ? Bleeding from an injury or your nose that does not stop. ? Unusual colored urine (red or dark brown) or unusual colored stools (red or black). ? Unusual bruising for unknown reasons. ? A serious fall or if you hit your head (even if there is no bleeding).  Some medicines may interact with Xarelto and might increase your risk of bleeding while on Xarelto. To help avoid this, consult your healthcare provider or pharmacist prior to using any new prescription or non-prescription  medications, including herbals, vitamins, non-steroidal anti-inflammatory drugs (NSAIDs) and supplements.  This website has more information on Xarelto: [email or web address].br.

## 2015-06-17 NOTE — Progress Notes (Signed)
   Subjective: 1 Day Post-Op Procedure(s) (LRB): RIGHT TOTAL HIP ARTHROPLASTY ANTERIOR APPROACH (Right) Patient reports pain as mild.   Patient seen in rounds with Dr. Lequita HaltAluisio.  Sitting up in bed.  Doing very well this morning. Patient is well, and has had no acute complaints or problems We will start therapy today. She wants to go home today. Will get two sessions in and then home. Plan is to go Home after hospital stay.  Objective: Vital signs in last 24 hours: Temp:  [97.6 F (36.4 C)-99 F (37.2 C)] 98.7 F (37.1 C) (06/30 0605) Pulse Rate:  [60-86] 74 (06/30 0605) Resp:  [10-20] 18 (06/30 0605) BP: (110-157)/(39-123) 136/62 mmHg (06/30 0605) SpO2:  [98 %-100 %] 98 % (06/30 0605) Weight:  [46.72 kg (103 lb)] 46.72 kg (103 lb) (06/29 1328)  Intake/Output from previous day:  Intake/Output Summary (Last 24 hours) at 06/17/15 0852 Last data filed at 06/17/15 0800  Gross per 24 hour  Intake   2510 ml  Output   3720 ml  Net  -1210 ml    Intake/Output this shift: Total I/O In: -  Out: 350 [Urine:350]  Labs:  Recent Labs  06/17/15 0425  HGB 11.8*    Recent Labs  06/17/15 0425  WBC 13.8*  RBC 3.68*  HCT 32.6*  PLT 222    Recent Labs  06/17/15 0425  NA 132*  K 3.6  CL 100*  CO2 24  BUN 7  CREATININE 0.46  GLUCOSE 134*  CALCIUM 8.5*   No results for input(s): LABPT, INR in the last 72 hours.  EXAM General - Patient is Alert, Appropriate and Oriented Extremity - Neurovascular intact Sensation intact distally Dorsiflexion/Plantar flexion intact Dressing - dressing C/D/I Motor Function - intact, moving foot and toes well on exam.  Hemovac pulled without difficulty.  Past Medical History  Diagnosis Date  . Arthritis   . Complication of anesthesia   . PONV (postoperative nausea and vomiting)   . Urinary incontinence     Assessment/Plan: 1 Day Post-Op Procedure(s) (LRB): RIGHT TOTAL HIP ARTHROPLASTY ANTERIOR APPROACH (Right) Principal  Problem:   Osteoarthritis of right hip Active Problems:   OA (osteoarthritis) of hip  Estimated body mass index is 18.83 kg/(m^2) as calculated from the following:   Height as of this encounter: 5\' 2"  (1.575 m).   Weight as of this encounter: 46.72 kg (103 lb). Advance diet Up with therapy Discharge home with home health  DVT Prophylaxis - Xarelto Weight Bearing As Tolerated right Leg Hemovac Pulled Begin Therapy  DC home after therapy F/U in two weeks on Tuesady 7/12 Diet - Regular WBAT to the right leg COD - improving at time of morning rounds  Avel Peacerew Marinell Igarashi, PA-C Orthopaedic Surgery 06/17/2015, 8:52 AM

## 2015-06-17 NOTE — Care Management Note (Signed)
Case Management Note  Patient Details  Name: Heather Nielsen MRN: [id number] Date of Birth: 1934-09-23  Subjective/Objective:                   RIGHT TOTAL HIP ARTHROPLASTY ANTERIOR APPROACH (Right) Action/Plan:  Discharge planning Expected Discharge Date:  06/17/15               Expected Discharge Plan:  [place]  In-House Referral:     Discharge planning Services  CM Consult  Post Acute Care Choice:  Home Health Choice offered to:     DME Arranged:  Walker rolling DME Agency:  [place] Arranged:  PT [facility] Agency:  [place]  Status of Service:  Completed, signed off  Medicare Important Message Given:    Date Medicare IM Given:    Medicare IM give by:    Date Additional Medicare IM Given:    Additional Medicare Important Message give by:     If discussed at [place] of Stay Meetings, dates discussed:    Additional Comments: CM met with pt and pt's daughter to offer choice of home health agency.  Pt chooses [facility] to render HHPT.  Address and contact information verified by pt.  Referral called to [facility] rep, Kristen.  CM called [facility] DME rep, Lecretia to please deliver the youth rolling walker to room prior to discharge.  No other CM needs were communicated. Dellie Catholic, RN 06/17/2015, 11:56 AM

## 2015-06-17 NOTE — Evaluation (Signed)
Occupational Therapy Evaluation Patient Details Name: Heather Nielsen MRN: [id number] DOB: 06/25/1934 Today's Date: 06/17/2015    History of Present Illness R THR   Clinical Impression   Pt is supposed to d/c today. Provided education on shower and toilet transfers and LB self care. Will follow if here after today to progress ADL independence but ok to d/c from OT standpoint today.    Follow Up Recommendations  No OT follow up;Supervision/Assistance - 24 hour    Equipment Recommendations  None recommended by OT    Recommendations for Other Services       Precautions / Restrictions Precautions Precautions: Fall Restrictions Weight Bearing Restrictions: No Other Position/Activity Restrictions: WBAT      Mobility Bed Mobility Overal bed mobility: Needs Assistance Bed Mobility: Supine to Sit     Supine to sit: Supervision        Transfers Overall transfer level: Needs assistance Equipment used: Rolling walker (2 wheeled) Transfers: Sit to/from Stand Sit to Stand: Min guard         General transfer comment: cues for hand placement.     Balance                                            ADL Overall ADL's : Needs assistance/impaired Eating/Feeding: Independent;Sitting   Grooming: Wash/dry hands;Set up;Sitting   Upper Body Bathing: Set up;Sitting   Lower Body Bathing: Minimal assistance;Sit to/from stand   Upper Body Dressing : Set up;Sitting   Lower Body Dressing: Moderate assistance;Sit to/from stand   Toilet Transfer: Min guard;Ambulation;Comfort height toilet;Grab bars;RW   Toileting- Clothing Manipulation and Hygiene: Sit to/from stand;Minimal assistance   Tub/ Shower Transfer: Walk-in shower;Minimal assistance;Rolling walker     General ADL Comments: Pt declines need for AE and states family will assist with LB self care. Discussed options for a shower chair including purchasing at drugstore versus a lawn chair as long as it  sits steady in shower. She has a standard height commode but did very well with reaching for edge of toilet to sit down and stand up. practiced shower transfer and pt needed min cues to bend knee in order to clear shower ledge. Discussed placement of chair in shower for safety.      Vision     Perception     Praxis      Pertinent Vitals/Pain Pain Assessment: 0-10 Pain Score: 5  Pain Location: R hip Pain Descriptors / Indicators: Aching Pain Intervention(s): Repositioned;Ice applied     Hand Dominance     Extremity/Trunk Assessment Upper Extremity Assessment Upper Extremity Assessment: Overall WFL for tasks assessed           Communication Communication Communication: No difficulties   Cognition Arousal/Alertness: Awake/alert Behavior During Therapy: WFL for tasks assessed/performed Overall Cognitive Status: Within Functional Limits for tasks assessed                     General Comments       Exercises       Shoulder Instructions      Home Living Family/patient expects to be discharged to:: Private residence Living Arrangements: Spouse/significant other Available Help at Discharge: Family Type of Home: House Home Access: Stairs to enter Secretary/administratorntrance Stairs-Number of Steps: 2   Home Layout: One level     Bathroom Shower/Tub: Producer, television/film/videoWalk-in shower   Bathroom Toilet: Standard  Home Equipment: None          Prior Functioning/Environment Level of Independence: Independent             OT Diagnosis: Generalized weakness   OT Problem List: Decreased strength;Decreased knowledge of use of DME or AE   OT Treatment/Interventions: Self-care/ADL training;Patient/family education;Therapeutic activities;DME and/or AE instruction    OT Goals(Current goals can be found in the care plan section) Acute Rehab OT Goals Patient Stated Goal: return to independence.  OT Goal Formulation: With patient Time For Goal Achievement: 06/24/15 Potential to Achieve  Goals: Good  OT Frequency: Min 2X/week   Barriers to D/C:            Co-evaluation              End of Session Equipment Utilized During Treatment: Rolling walker  Activity Tolerance: Patient tolerated treatment well Patient left: in chair;with call bell/phone within reach   Time: 0945-1012 OT Time Calculation (min): 27 min Charges:  OT General Charges $OT Visit: 1 Procedure OT Evaluation $Initial OT Evaluation Tier I: 1 Procedure OT Treatments $Therapeutic Activity: 8-22 mins G-Codes:    Lennox Laity  [phone] 06/17/2015, 10:19 AM

## 2015-06-17 NOTE — Progress Notes (Signed)
Physical Therapy Treatment Patient Details Name: Heather Nielsen MRN: [id number] DOB: 02/06/1934 Today's Date: 06/17/2015    History of Present Illness R THR    PT Comments    POD # 1 pt progressing well and plans to D/C to home today.  Amb in hallway, performed stairs and TE's.      Follow Up Recommendations  Home health PT     Equipment Recommendations  Rolling walker with 5" wheels (Youth)    Recommendations for Other Services       Precautions / Restrictions Precautions Precautions: Fall Restrictions Weight Bearing Restrictions: No Other Position/Activity Restrictions: WBAT    Mobility  Bed Mobility Overal bed mobility: Modified Independent Bed Mobility: Supine to Sit     Supine to sit: Modified independent (Device/Increase time)        Transfers Overall transfer level: Modified independent Equipment used: Rolling walker (2 wheeled) Transfers: Sit to/from Stand Sit to Stand: Supervision         General transfer comment: <25% VC's on dafety with turn completion  Ambulation/Gait Ambulation/Gait assistance: Supervision Ambulation Distance (Feet): 85 Feet Assistive device: Rolling walker (2 wheeled) Gait Pattern/deviations: Step-to pattern Gait velocity: WFL   General Gait Details: 25% VC's safety with backward gait   Stairs Stairs: Yes Stairs assistance: Min assist Stair Management: One rail Right;Step to pattern;Forwards Number of Stairs: 2 General stair comments: 25% VC's on proper tech and safety  Wheelchair Mobility    Modified Rankin (Stroke Patients Only)       Balance                                    Cognition Arousal/Alertness: Awake/alert Behavior During Therapy: WFL for tasks assessed/performed Overall Cognitive Status: Within Functional Limits for tasks assessed                      Exercises   Total Hip Replacement TE's 10 reps ankle pumps 10 reps knee presses 10 reps heel slides 10 reps  SAQ's 10 reps ABD Followed by ICE     General Comments        Pertinent Vitals/Pain Pain Assessment: 0-10 Pain Score: 3  Pain Location: R hip Pain Descriptors / Indicators: Aching Pain Intervention(s): Monitored during session;Premedicated before session;Repositioned;Ice applied    Home Living Family/patient expects to be discharged to:: Private residence Living Arrangements: Spouse/significant other Available Help at Discharge: Family Type of Home: House Home Access: Stairs to enter   Home Layout: One level Home Equipment: None      Prior Function Level of Independence: Independent          PT Goals (current goals can now be found in the care plan section) Acute Rehab PT Goals Patient Stated Goal: return to independence.  Progress towards PT goals: Progressing toward goals    Frequency  7X/week    PT Plan      Co-evaluation             End of Session Equipment Utilized During Treatment: Gait belt Activity Tolerance: Patient tolerated treatment well Patient left: in chair;with call bell/phone within reach     Time: 0900-0925 PT Time Calculation (min) (ACUTE ONLY): 25 min  Charges:  $Gait Training: 8-22 mins $Therapeutic Exercise: 8-22 mins                    G Codes:      Heather Nielsen  PTA [facility]  Acute  Rehab Pager      [phone]

## 2015-06-17 NOTE — Progress Notes (Signed)
[facility]   [facility] the [facility]: RW  If patient discharges after hours, please call [phone].   Heather HamperLecretia Nielsen 06/17/2015, 2:11 PM

## 2015-06-18 ENCOUNTER — Telehealth: Payer: Self-pay

## 2015-06-18 NOTE — Telephone Encounter (Signed)
Pt is on TCM list. Pt is to follow up with ortho 2 wks. Pt had surgery on hip.

## 2015-12-06 ENCOUNTER — Encounter: Payer: Medicare Other | Admitting: Internal Medicine

## 2016-01-17 ENCOUNTER — Other Ambulatory Visit: Payer: Self-pay | Admitting: Family Medicine

## 2016-01-17 DIAGNOSIS — E785 Hyperlipidemia, unspecified: Secondary | ICD-10-CM

## 2016-01-21 ENCOUNTER — Ambulatory Visit
Admission: RE | Admit: 2016-01-21 | Discharge: 2016-01-21 | Disposition: A | Discharge status: Home / Self Care | Payer: Medicare Other | Source: Ambulatory Visit | Attending: Family Medicine | Admitting: Family Medicine

## 2016-01-21 DIAGNOSIS — E785 Hyperlipidemia, unspecified: Secondary | ICD-10-CM

## 2016-01-21 IMAGING — US US CAROTID DUPLEX BILAT
1 series · 13 of 24 positions shown · non-contrast
Comparison: None.

CLINICAL DATA: 81-year-old female with hyperlipidemia

EXAM:
BILATERAL CAROTID DUPLEX ULTRASOUND
TECHNIQUE: Gray scale imaging, color Doppler and duplex ultrasound were
performed of bilateral carotid and vertebral arteries in the neck.

[Series 1: us carotid duplex bilat · 0.07mm/px · 13 of 53 slices shown]
[im 1/53]
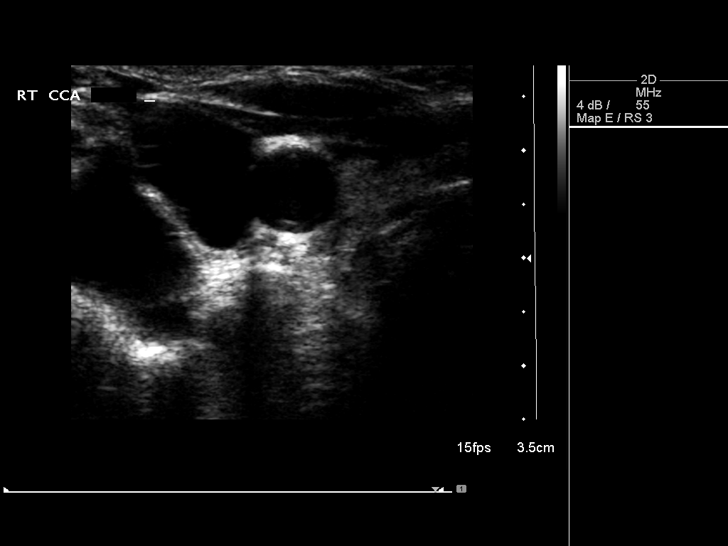
[im 5/53]
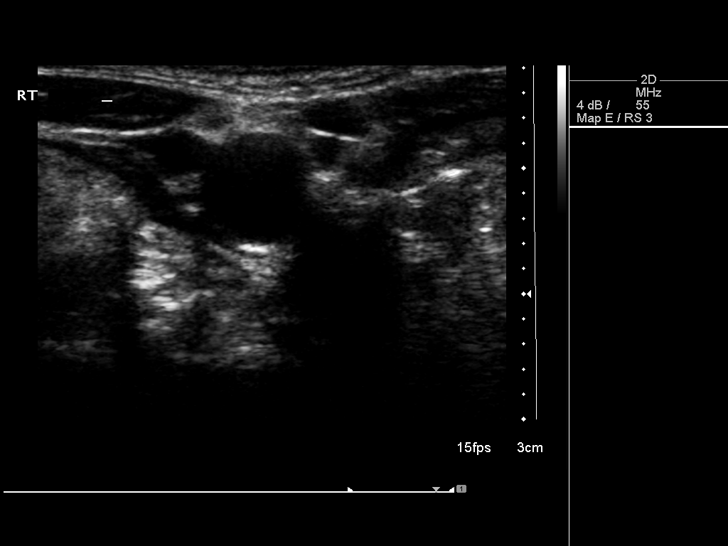
[im 10/53]
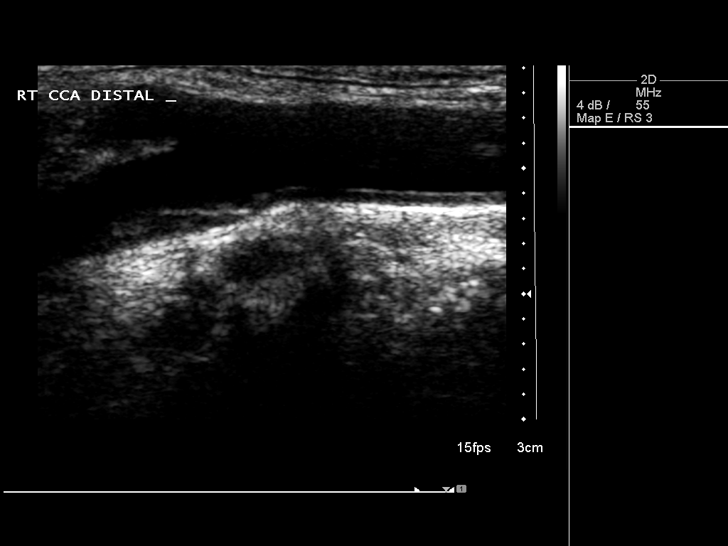
[im 14/53]
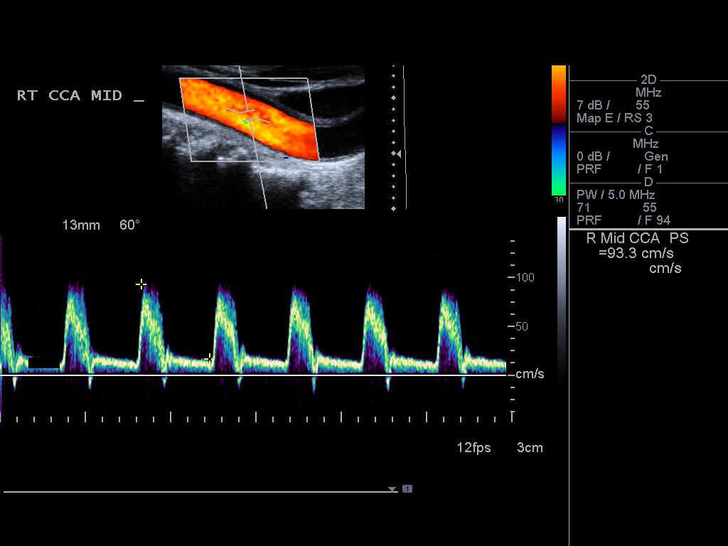
[im 19/53]
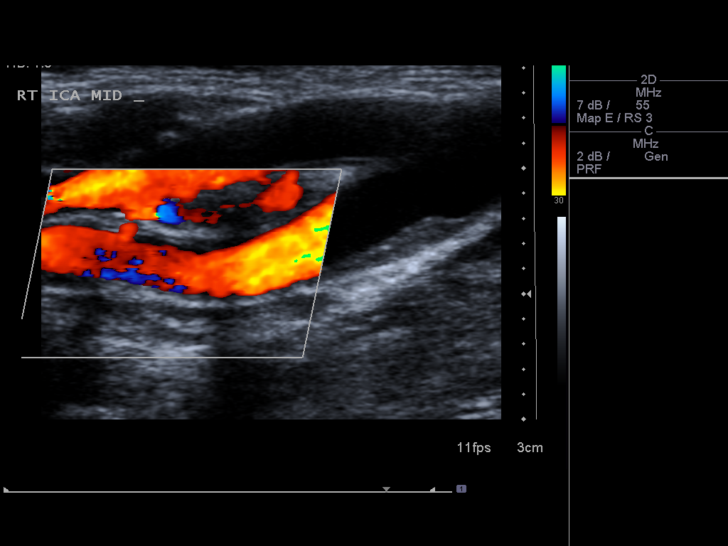
[im 23/53]
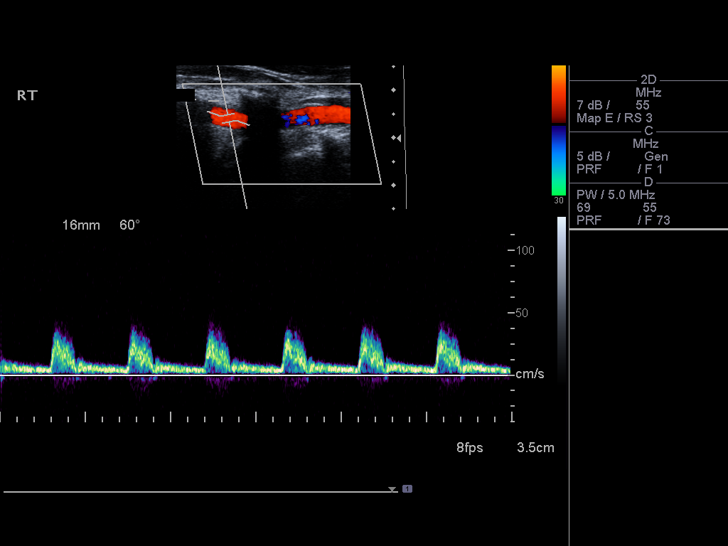
[im 28/53]
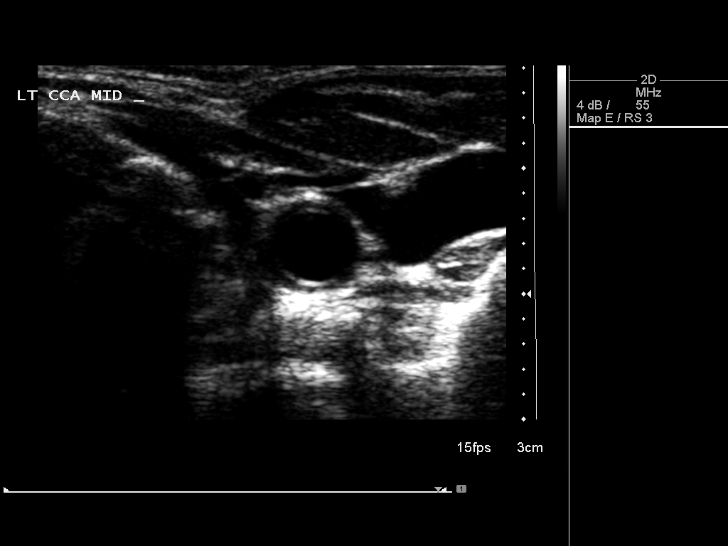
[im 30/53]
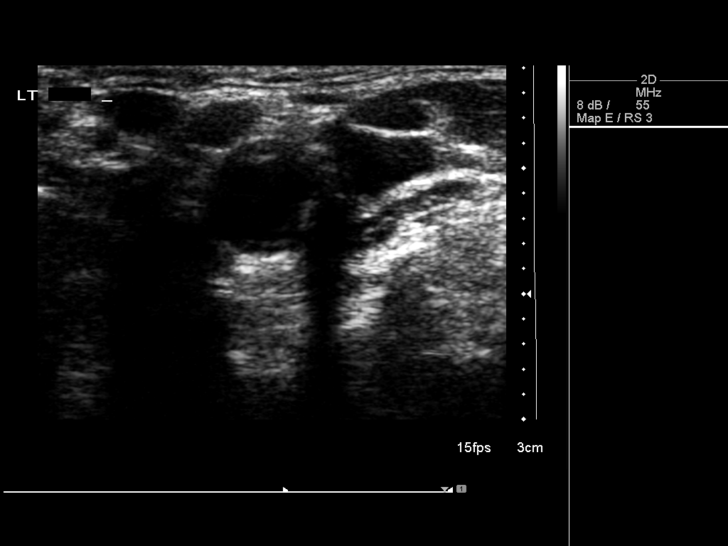
[im 34/53]
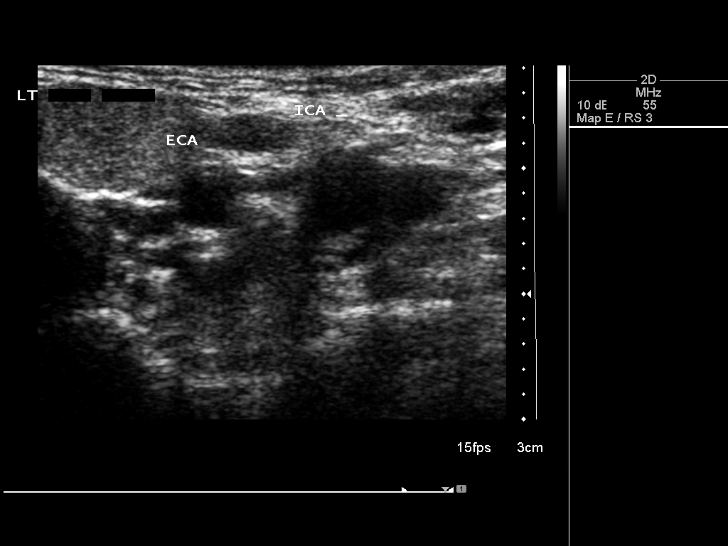
[im 39/53]
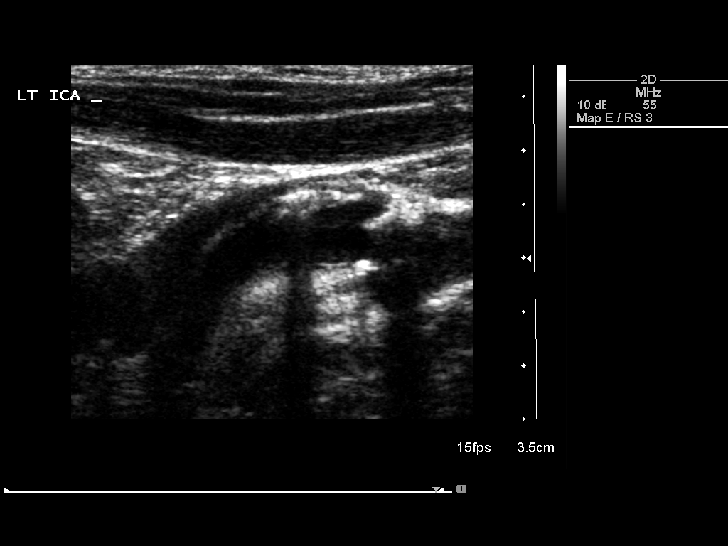
[im 43/53]
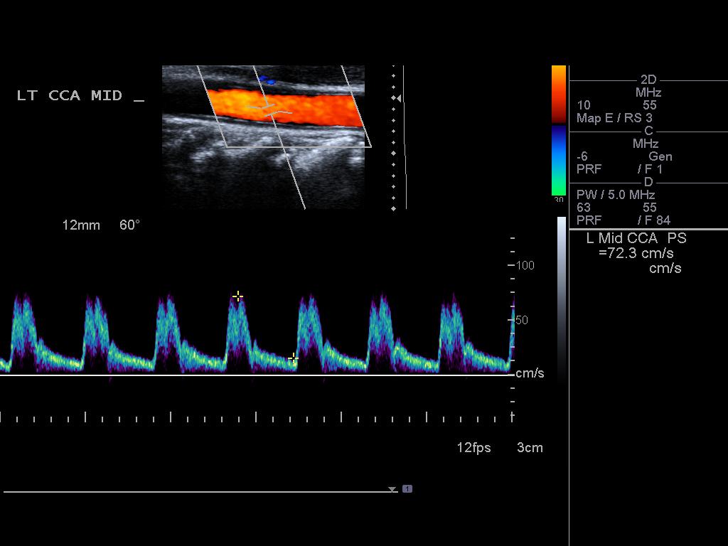
[im 48/53]
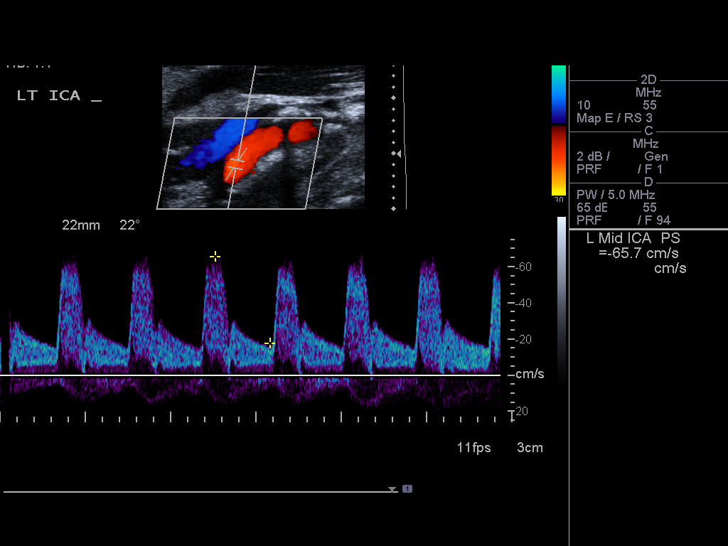
[im 53/53]
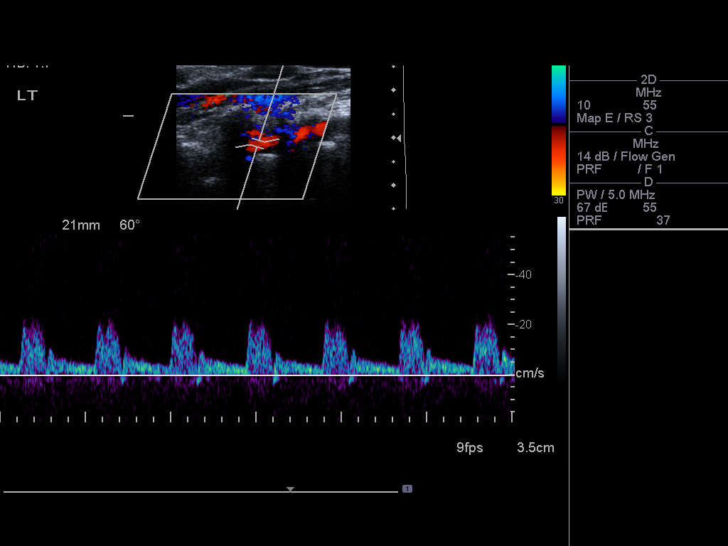

[13 of 24 positions shown; findings below may reference images not displayed]

FINDINGS: Criteria: Quantification of carotid stenosis is based on velocity
parameters that correlate the residual internal carotid diameter
with NASCET-based stenosis levels, using the diameter of the distal
internal carotid lumen as the denominator for stenosis measurement.

The following velocity measurements were obtained:

RIGHT

ICA:  96/24 cm/sec

CCA:  93/17 cm/sec

SYSTOLIC ICA/CCA RATIO:

DIASTOLIC ICA/CCA RATIO:

ECA:  88 cm/sec

LEFT

ICA:  86/13 cm/sec

CCA:  96/14 cm/sec

SYSTOLIC ICA/CCA RATIO:

DIASTOLIC ICA/CCA RATIO:

ECA:  95 cm/sec

RIGHT CAROTID ARTERY: Heterogeneous plaque in the proximal internal
carotid artery. Plaque is smooth. By peak systolic velocity criteria
the estimated stenosis remains less than 50%.

RIGHT VERTEBRAL ARTERY:  Patent with normal antegrade flow.

LEFT CAROTID ARTERY: Heterogeneous and partially calcified
atherosclerotic plaque in the proximal internal carotid artery. By
peak systolic velocity criteria the estimated stenosis remains less
than 50%.

LEFT VERTEBRAL ARTERY:  Patent with normal antegrade flow.
IMPRESSION: 1. Mild (1-49%) stenosis proximal right internal carotid artery
secondary to smooth heterogeneous atherosclerotic plaque.
2. Mild (1-49%) stenosis proximal left internal carotid artery
secondary to irregular and partially calcified heterogeneous
atherosclerotic plaque.
3. Vertebral arteries remain patent with normal antegrade flow.

## 2016-05-30 ENCOUNTER — Encounter: Payer: Self-pay | Admitting: Cardiology

## 2016-05-30 ENCOUNTER — Telehealth: Payer: Self-pay | Admitting: Cardiology

## 2016-05-30 NOTE — Telephone Encounter (Signed)
Received records from [facility] for appointment on 06/08/16 with Dr Antoine PocheHochrein.  [facility] for Dr Hochrein's schedule on 06/08/16. lp

## 2016-06-07 NOTE — Progress Notes (Signed)
Cardiology Office Note   Date:  06/08/2016   ID:  Muranda Coye, DOB [birth year], MRN [id number]  PCP:  Cain Saupe, MD  Cardiologist:   Rollene Rotunda, MD   Chief Complaint  Patient presents with  . Pre-op Exam  . Dyslipidemia      History of Present Illness: Heather Nielsen is a 82 y.o. female who presents for evaluation of dyslipidemia and preoperative clearance.  The patient has no prior cardiac history. She does have significant LDL elevation. She's not had prior cardiac workup and she looks much younger than her stated age. She exercises 5 days per week swimming and walking. With this she denies any chest pressure, neck or arm discomfort. She denies any shortness of breath, PND or orthopnea. She has no palpitations, presyncope or syncope. She has no weight gain or edema. She does have an LDL that is calculated at 409. However, her HDL was 114. She's had some vascular studies with very mild carotid plaque. She has not wanted to take statins. She said she apparently did take something and it looks like Pravachol at one point in time and she thought this caused significant symptoms. She is being considered for left total hip replacement.    Past Medical History  Diagnosis Date  . Arthritis   . Complication of anesthesia   . PONV (postoperative nausea and vomiting)   . Urinary incontinence     Past Surgical History  Procedure Laterality Date  . Rotator cuff repair Right 2006  . Knee surgery  2007    menicus   . Total hip arthroplasty Right 06/16/2015    Procedure: RIGHT TOTAL HIP ARTHROPLASTY ANTERIOR APPROACH;  Surgeon: Ollen Gross, MD;  Location: [facility];  Service: Orthopedics;  Laterality: Right;     No current outpatient prescriptions on file.   No current facility-administered medications for this visit.    Allergies:   Penicillins and Pneumococcal vaccines    Social History:  The patient  reports that she quit smoking about 31 years ago. Her smoking use included  Cigarettes. She has a 6.25 pack-year smoking history. She has never used smokeless tobacco. She reports that she drinks alcohol. She reports that she does not use illicit drugs.   Family History:  The patient's family history includes CAD (age of onset: 36) in her brother; Heart disease in her mother.    ROS:  Please see the history of present illness.   Otherwise, review of systems are positive for joint pain.   All other systems are reviewed and negative.    PHYSICAL EXAM: VS:  BP 164/80 mmHg  Pulse 78  Ht  (1.575 m)  Wt 103 lb (46.72 kg)  BMI 18.83 kg/m2 , BMI Body mass index is 18.83 kg/(m^2). GENERAL:  Well appearing HEENT:  Pupils equal round and reactive, fundi not visualized, oral mucosa unremarkable NECK:  No jugular venous distention, waveform within normal limits, carotid upstroke brisk and symmetric, no bruits, no thyromegaly LYMPHATICS:  No cervical, inguinal adenopathy LUNGS:  Clear to auscultation bilaterally BACK:  No CVA tenderness CHEST:  Unremarkable HEART:  PMI not displaced or sustained,S1 and S2 within normal limits, no S3, no S4, no clicks, no rubs, no murmurs ABD:  Flat, positive bowel sounds normal in frequency in pitch, no bruits, no rebound, no guarding, no midline pulsatile mass, no hepatomegaly, no splenomegaly EXT:  2 plus pulses throughout, no edema, no cyanosis no clubbing SKIN:  No rashes no nodules NEURO:  Cranial nerves  II through XII grossly intact, motor grossly intact throughout [facility]:  Cognitively intact, oriented to person place and time    EKG:  EKG is ordered today. The ekg ordered today demonstrates sinus rhythm, rate 78, axis within normal limits, intervals within normal limits, poor anterior R wave progression, no acute ST-T wave changes.   Recent Labs: 06/10/2015: ALT 14 06/17/2015: BUN 7; Creatinine, Ser 0.46; Hemoglobin 11.8*; Platelets 222; Potassium 3.6; Sodium 132*    Lipid Panel    Component Value Date/Time   CHOL 281*  12/03/2014 1053   TRIG 59.0 12/03/2014 1053   HDL 91.70 12/03/2014 1053   CHOLHDL 3 12/03/2014 1053   VLDL 11.8 12/03/2014 1053   LDLCALC 178* 12/03/2014 1053      Wt Readings from Last 3 Encounters:  06/08/16 103 lb (46.72 kg)  06/16/15 103 lb (46.72 kg)  06/10/15 103 lb 4 oz (46.834 kg)      Other studies Reviewed: Additional studies/ records that were reviewed today include: Office records. Review of the above records demonstrates:  Please see elsewhere in the note.     ASSESSMENT AND PLAN:  PREOP:  The patient has a very high functional level. She has no high-risk historical findings or physical findings. She has no ongoing symptoms. She is not going for a high-risk procedure from a cardiovascular standpoint. Therefore, based on ACC/AHA guidelines, the patient would be at acceptable risk for the planned procedure without further cardiovascular testing.  DYSLIPIDEMIA:  We had a long discussion about this. At this point she is averse to taking medications and given the tremendous HDL and lack of a known diagnosis of vascular disease I could not strongly suggests statins as necessary therapy. She will continue with lifestyle modification.  HTN:  She reports her blood pressure is elevated here but is always normal at home. No change in therapy is indicated.   Current medicines are reviewed at length with the patient today.  The patient does not have concerns regarding medicines.  The following changes have been made:  no change  Labs/ tests ordered today include:   Orders Placed This Encounter  Procedures  . EKG 12-Lead     Disposition:   FU with me as needed.     Signed, Rollene RotundaJames Toni Demo, MD  06/08/2016 12:59 PM    [facility]

## 2016-06-08 ENCOUNTER — Encounter: Payer: Self-pay | Admitting: Cardiology

## 2016-06-08 ENCOUNTER — Telehealth: Payer: Self-pay | Admitting: *Deleted

## 2016-06-08 ENCOUNTER — Ambulatory Visit (INDEPENDENT_AMBULATORY_CARE_PROVIDER_SITE_OTHER): Payer: Medicare Other | Admitting: Cardiology

## 2016-06-08 VITALS — BP 164/80 | HR 78 | Ht 62.0 in | Wt 103.0 lb

## 2016-06-08 DIAGNOSIS — Z01818 Encounter for other preprocedural examination: Secondary | ICD-10-CM

## 2016-06-08 DIAGNOSIS — E785 Hyperlipidemia, unspecified: Secondary | ICD-10-CM

## 2016-06-08 NOTE — Patient Instructions (Signed)
Your physician recommends that you schedule a follow-up appointment in: As Needed    

## 2016-06-08 NOTE — Telephone Encounter (Signed)
Clearance form sign and faxed along with Dr Anto[facility]Hochrein [facility]

## 2016-08-23 ENCOUNTER — Ambulatory Visit: Payer: Self-pay | Admitting: Orthopedic Surgery

## 2016-08-31 NOTE — Patient Instructions (Signed)
Heather Nielsen  08/31/2016   Your procedure is scheduled on: 09/06/2016    [facility]  [facility] to 3rd floor to  Short Stay Center at  1445pm  Call this number if you have problems the morning of surgery [phone]   Remember: ONLY 1 PERSON MAY GO WITH YOU TO SHORT STAY TO GET  READY MORNING OF YOUR SURGERY.  Do not eat food after midnite. May have clear liquids from 12 midnite until 1000am then nothing by mouth.       Take these medicines the morning of surgery with A SIP OF WATER: none  DO NOT TAKE ANY DIABETIC MEDICATIONS DAY OF YOUR SURGERY                               You may not have any metal on your body including hair pins and              piercings  Do not wear jewelry, make-up, lotions, powders or perfumes, deodorant             Do not wear nail polish.  Do not shave  48 hours prior to surgery.               Do not bring valuables to the hospital. [facility] IS NOT             RESPONSIBLE   FOR VALUABLES.  Contacts, dentures or bridgework may not be worn into surgery.  Leave suitcase in the car. After surgery it may be brought to your room.       Special Instructions:    CLEAR LIQUID DIET   Foods Allowed                                                                     Foods Excluded  Coffee and tea, regular and decaf                             liquids that you cannot  Plain Jell-O in any flavor                                             see through such as: Fruit ices (not with fruit pulp)                                     milk, soups, orange juice  Iced Popsicles                                    All solid food Carbonated beverages, regular and diet  Cranberry, grape and apple juices Sports drinks like Gatorade Lightly seasoned clear broth or consume(fat free) Sugar, honey syrup  Sample Menu Breakfast                                Lunch                                      Supper Cranberry juice                    Beef broth                            Chicken broth Jell-O                                     Grape juice                           Apple juice Coffee or tea                        Jell-O                                      Popsicle                                                Coffee or tea                        Coffee or tea  _____________________________________________________________________                Please read over the following fact sheets you were given: _____________________________________________________________________             [facility] - Preparing for Surgery Before surgery, you can play an important role.  Because skin is not sterile, your skin needs to be as free of germs as possible.  You can reduce the number of germs on your skin by washing with CHG (chlorahexidine gluconate) soap before surgery.  CHG is an antiseptic cleaner which kills germs and bonds with the skin to continue killing germs even after washing. Please DO NOT use if you have an allergy to CHG or antibacterial soaps.  If your skin becomes reddened/irritated stop using the CHG and inform your nurse when you arrive at Short Stay. Do not shave (including legs and underarms) for at least 48 hours prior to the first CHG shower.  You may shave your face/neck. Please follow these instructions carefully:  1.  Shower with CHG Soap the night before surgery and the  morning of Surgery.  2.  If you choose to wash your hair, wash your hair first as usual with your  normal  shampoo.  3.  After you shampoo, rinse your hair and body thoroughly to remove the  shampoo.  4.  Use CHG as you would any other liquid soap.  You can apply chg directly  to the skin and wash                       Gently with a scrungie or clean washcloth.  5.  Apply the CHG Soap to your body ONLY FROM THE NECK DOWN.   Do not use on face/ open                            Wound or open sores. Avoid contact with eyes, ears mouth and genitals (private parts).                       Wash face,  Genitals (private parts) with your normal soap.             6.  Wash thoroughly, paying special attention to the area where your surgery  will be performed.  7.  Thoroughly rinse your body with warm water from the neck down.  8.  DO NOT shower/wash with your normal soap after using and rinsing off  the CHG Soap.                9.  Pat yourself dry with a clean towel.            10.  Wear clean pajamas.            11.  Place clean sheets on your bed the night of your first shower and do not  sleep with pets. Day of Surgery : Do not apply any lotions/deodorants the morning of surgery.  Please wear clean clothes to the hospital/surgery center.  FAILURE TO FOLLOW THESE INSTRUCTIONS MAY RESULT IN THE CANCELLATION OF YOUR SURGERY PATIENT SIGNATURE_________________________________  NURSE SIGNATURE__________________________________  ________________________________________________________________________  WHAT IS A BLOOD TRANSFUSION? Blood Transfusion Information  A transfusion is the replacement of blood or some of its parts. Blood is made up of multiple cells which provide different functions.  Red blood cells carry oxygen and are used for blood loss replacement.  White blood cells fight against infection.  Platelets control bleeding.  Plasma helps clot blood.  Other blood products are available for specialized needs, such as hemophilia or other clotting disorders. BEFORE THE TRANSFUSION  Who gives blood for transfusions?   Healthy volunteers who are fully evaluated to make sure their blood is safe. This is blood bank blood. Transfusion therapy is the safest it has ever been in the practice of medicine. Before blood is taken from a donor, a complete history is taken to make sure that person has no history of diseases nor engages in risky social  behavior (examples are intravenous drug use or sexual activity with multiple partners). The donor's travel history is screened to minimize risk of transmitting infections, such as malaria. The donated blood is tested for signs of infectious diseases, such as HIV and hepatitis. The blood is then tested to be sure it is compatible with you in order to minimize the chance of a transfusion reaction. If you or a relative donates blood, this is often done in anticipation of surgery and is not appropriate for emergency situations. It takes many days to process the donated blood. RISKS AND COMPLICATIONS Although transfusion therapy is very safe and saves many lives, the main dangers of transfusion include:   Getting an infectious disease.  Developing a transfusion reaction.  This is an allergic reaction to something in the blood you were given. Every precaution is taken to prevent this. The decision to have a blood transfusion has been considered carefully by your caregiver before blood is given. Blood is not given unless the benefits outweigh the risks. AFTER THE TRANSFUSION  Right after receiving a blood transfusion, you will usually feel much better and more energetic. This is especially true if your red blood cells have gotten low (anemic). The transfusion raises the level of the red blood cells which carry oxygen, and this usually causes an energy increase.  The nurse administering the transfusion will monitor you carefully for complications. HOME CARE INSTRUCTIONS  No special instructions are needed after a transfusion. You may find your energy is better. Speak with your caregiver about any limitations on activity for underlying diseases you may have. SEEK MEDICAL CARE IF:   Your condition is not improving after your transfusion.  You develop redness or irritation at the intravenous (IV) site. SEEK IMMEDIATE MEDICAL CARE IF:  Any of the following symptoms occur over the next 12 hours:  Shaking  chills.  You have a temperature by mouth above 102 F (38.9 C), not controlled by medicine.  Chest, back, or muscle pain.  People around you feel you are not acting correctly or are confused.  Shortness of breath or difficulty breathing.  Dizziness and fainting.  You get a rash or develop hives.  You have a decrease in urine output.  Your urine turns a dark color or changes to pink, red, or brown. Any of the following symptoms occur over the next 10 days:  You have a temperature by mouth above 102 F (38.9 C), not controlled by medicine.  Shortness of breath.  Weakness after normal activity.  The white part of the eye turns yellow (jaundice).  You have a decrease in the amount of urine or are urinating less often.  Your urine turns a dark color or changes to pink, red, or brown. Document Released: 12/01/2000 Document Revised: 02/26/2012 Document Reviewed: 07/20/2008 ExitCare Patient Information 2014 [place].  _______________________________________________________________________  Incentive Spirometer  An incentive spirometer is a tool that can help keep your lungs clear and active. This tool measures how well you are filling your lungs with each breath. Taking long deep breaths may help reverse or decrease the chance of developing breathing (pulmonary) problems (especially infection) following:  A long period of time when you are unable to move or be active. BEFORE THE PROCEDURE   If the spirometer includes an indicator to show your best effort, your nurse or respiratory therapist will set it to a desired goal.  If possible, sit up straight or lean slightly forward. Try not to slouch.  Hold the incentive spirometer in an upright position. INSTRUCTIONS FOR USE  1. Sit on the edge of your bed if possible, or sit up as far as you can in bed or on a chair. 2. Hold the incentive spirometer in an upright position. 3. Breathe out normally. 4. Place the  mouthpiece in your mouth and seal your lips tightly around it. 5. Breathe in slowly and as deeply as possible, raising the piston or the ball toward the top of the column. 6. Hold your breath for 3-5 seconds or for as long as possible. Allow the piston or ball to fall to the bottom of the column. 7. Remove the mouthpiece from your mouth and breathe out normally. 8. Rest for a few seconds and repeat Steps  1 through 7 at least 10 times every 1-2 hours when you are awake. Take your time and take a few normal breaths between deep breaths. 9. The spirometer may include an indicator to show your best effort. Use the indicator as a goal to work toward during each repetition. 10. After each set of 10 deep breaths, practice coughing to be sure your lungs are clear. If you have an incision (the cut made at the time of surgery), support your incision when coughing by placing a pillow or rolled up towels firmly against it. Once you are able to get out of bed, walk around indoors and cough well. You may stop using the incentive spirometer when instructed by your caregiver.  RISKS AND COMPLICATIONS  Take your time so you do not get dizzy or light-headed.  If you are in pain, you may need to take or ask for pain medication before doing incentive spirometry. It is harder to take a deep breath if you are having pain. AFTER USE  Rest and breathe slowly and easily.  It can be helpful to keep track of a log of your progress. Your caregiver can provide you with a simple table to help with this. If you are using the spirometer at home, follow these instructions: [place] IF:   You are having difficultly using the spirometer.  You have trouble using the spirometer as often as instructed.  Your pain medication is not giving enough relief while using the spirometer.  You develop fever of 100.5 F (38.1 C) or higher. SEEK IMMEDIATE MEDICAL CARE IF:   You cough up bloody sputum that had not been present  before.  You develop fever of 102 F (38.9 C) or greater.  You develop worsening pain at or near the incision site. MAKE SURE YOU:   Understand these instructions.  Will watch your condition.  Will get help right away if you are not doing well or get worse. Document Released: 04/16/2007 Document Revised: 02/26/2012 Document Reviewed: 06/17/2007 [facility] Patient Information 2014 [place], [place].   ________________________________________________________________________

## 2016-09-04 ENCOUNTER — Encounter (HOSPITAL_COMMUNITY): Payer: Self-pay

## 2016-09-04 ENCOUNTER — Encounter (HOSPITAL_COMMUNITY)
Admission: RE | Admit: 2016-09-04 | Discharge: 2016-09-04 | Disposition: A | Discharge status: Home / Self Care | Payer: Medicare Other | Source: Ambulatory Visit | Attending: Orthopedic Surgery | Admitting: Orthopedic Surgery

## 2016-09-04 LAB — COMPREHENSIVE METABOLIC PANEL
ALBUMIN: 4.1 g/dL (ref 3.5–5.0)
ALK PHOS: 67 U/L (ref 38–126)
ALT: 13 U/L — ABNORMAL LOW (ref 14–54)
ANION GAP: 7 (ref 5–15)
AST: 24 U/L (ref 15–41)
BUN: 15 mg/dL (ref 6–20)
CALCIUM: 9 mg/dL (ref 8.9–10.3)
CHLORIDE: 100 mmol/L — AB (ref 101–111)
CO2: 26 mmol/L (ref 22–32)
Creatinine, Ser: 0.63 mg/dL (ref 0.44–1.00)
GFR calc Af Amer: >60 mL/min (ref >60)
GFR calc non Af Amer: >60 mL/min (ref >60)
GLUCOSE: 103 mg/dL — AB (ref 65–99)
Potassium: 4.1 mmol/L (ref 3.5–5.1)
SODIUM: 133 mmol/L — AB (ref 135–145)
Total Bilirubin: 0.4 mg/dL (ref 0.3–1.2)
Total Protein: 7 g/dL (ref 6.5–8.1)

## 2016-09-04 LAB — URINALYSIS, ROUTINE W REFLEX MICROSCOPIC
BILIRUBIN URINE: NEGATIVE
Glucose, UA: NEGATIVE mg/dL
KETONES UR: NEGATIVE mg/dL
Leukocytes, UA: NEGATIVE
Nitrite: NEGATIVE
Protein, ur: NEGATIVE mg/dL
Specific Gravity, Urine: 1.028 (ref 1.005–1.030)
pH: 6 (ref 5.0–8.0)

## 2016-09-04 LAB — CBC
HCT: 38.6 % (ref 36.0–46.0)
HEMOGLOBIN: 13.5 g/dL (ref 12.0–15.0)
MCH: 31.1 pg (ref 26.0–34.0)
MCHC: 35 g/dL (ref 30.0–36.0)
MCV: 88.9 fL (ref 78.0–100.0)
Platelets: 317 10*3/uL (ref 150–400)
RBC: 4.34 MIL/uL (ref 3.87–5.11)
RDW: 12.5 % (ref 11.5–15.5)
WBC: 6.4 10*3/uL (ref 4.0–10.5)

## 2016-09-04 LAB — URINE MICROSCOPIC-ADD ON

## 2016-09-04 LAB — PROTIME-INR
INR: 0.93
Prothrombin Time: 12.5 seconds (ref 11.4–15.2)

## 2016-09-04 LAB — SURGICAL PCR SCREEN
MRSA, PCR: NEGATIVE
STAPHYLOCOCCUS AUREUS: NEGATIVE

## 2016-09-04 LAB — APTT: APTT: 28 s (ref 24–36)

## 2016-09-04 NOTE — Progress Notes (Signed)
EKG-06/08/16- EPIC  Clearance- Dr Antoine PocheHochrein 06/08/16-on chart  LOV- 06/08/16- Dr Antoine PocheHochrein on chart

## 2016-09-04 NOTE — Progress Notes (Signed)
Temp-99.0 at preop on 09/04/2016

## 2016-09-04 NOTE — Progress Notes (Signed)
U/A and micro results done 09/04/16 faxed via EPIC to Dr Lequita HaltAluisio.

## 2016-09-05 ENCOUNTER — Ambulatory Visit: Payer: Self-pay | Admitting: Orthopedic Surgery

## 2016-09-05 NOTE — H&P (Signed)
Heather Nielsen DOB: 06/16/1934 Married / Language: English / Race: White Female Date of Admission:  09/06/2016 CC:  Left Hip Pain History of Present Illness  The patient is a 82 year old female who comes in today for a preoperative History and Physical. The patient is scheduled for a left total hip arthroplasty (anterior) to be performed by Dr. Gus Rankin. [name], MD at [facility] on 09/06/2016. [name] is now over 10 months out from right total hip replacement. She says this is doing great causes her no discomfort. She does have some discomfort in the left hip and does usually present more as referred pain to the knee. She does notice decreased range of motion and increasing discomfort at this time. AP and lateral views of the right hip show the right hip prosthesis in good position no periprosthetic abnormalities. The left hip, she does have significant arthritic change with bone-on-bone changes, osteophyte formation as well as significant cystic formation in the femoral head. It is felt that she would benefit form undergoing surgery on the left hip at this time.   Risks and benefits of the surgery have been discussed with the patient and they elect to proceed with surgery.  There are on active contraindications to upcoming procedure such as ongoing infection or progressive neurological disease.  Problem List/Past Medical  Aftercare following right hip joint replacement surgery (Z47.1)  Primary osteoarthritis of both knees (M17.0)  Primary osteoarthritis of left hip (M16.12)  Status post right hip replacement (W09.811)  Osteoarthritis  Osteoporosis  Allergies Penicillin V Potassium *PENICILLINS*  vaccine Pneumococcal 7-Val Conj Vacc *VACCINES*   Family History Kidney disease  Father. Heart disease in female family member before age 57  Hypertension  Mother. Cerebrovascular Accident  Mother. First Degree Relatives  reported Heart Disease  Brother, Mother.  Social  History  Not under pain contract  No history of drug/alcohol rehab  Number of flights of stairs before winded  greater than 5 Tobacco use  Former smoker. 09/08/2014: smoke(d) less than 1/2 pack(s) per day Tobacco / smoke exposure  09/08/2014: no Marital status  married Children  5 or more Current work status  retired Living situation  live with spouse Exercise  Exercises daily; does running / walking and individual sport Current drinker  09/08/2014: Currently drinks wine and hard liquor 8-14 times per week  Medication History No Current Medications (Taken starting 08/31/2015)  Past Surgical History Arthroscopy of Knee  right Arthroscopy of Shoulder  right Total Hip Replacement - Right  Date: 05/2015.  Review of Systems ([name] Charlann Boxer) III PA-C; 08/15/2016 11:20 AM) General Not Present- Chills, Fatigue, Fever, Memory Loss, Night Sweats, Weight Gain and Weight Loss. Skin Not Present- Eczema, Hives, Itching, Lesions and Rash. HEENT Not Present- Dentures, Double Vision, Headache, Hearing Loss, Tinnitus and Visual Loss. Respiratory Not Present- Allergies, Chronic Cough, Coughing up blood, Shortness of breath at rest and Shortness of breath with exertion. Cardiovascular Not Present- Chest Pain, Difficulty Breathing Lying Down, Murmur, Palpitations, Racing/skipping heartbeats and Swelling. Gastrointestinal Not Present- Abdominal Pain, Bloody Stool, Constipation, Diarrhea, Difficulty Swallowing, Heartburn, Jaundice, Loss of appetitie, Nausea and Vomiting. Female Genitourinary Not Present- Blood in Urine, Discharge, Flank Pain, Incontinence, Painful Urination, Urgency, Urinary frequency, Urinary Retention, Urinating at Night and Weak urinary stream. Musculoskeletal Present- Joint Pain. Not Present- Back Pain, Joint Swelling, Morning Stiffness, Muscle Pain, Muscle Weakness and Spasms. Neurological Not Present- Blackout spells, Difficulty with balance, Dizziness,  Paralysis, Tremor and Weakness. Psychiatric Not Present- Insomnia.  Vitals  Weight: 103 lb Height: 62in Body Surface Area: 1.44 m Body Mass Index: 18.84 kg/m  Pulse: 76 (Regular)  BP: 152/78 (Sitting, Left Arm, Standard)  Physical Exam General Mental Status -Alert, cooperative and good historian. General Appearance-pleasant, Not in acute distress. Orientation-Oriented X3. Build & Nutrition-Well nourished and Well developed.  Head and Neck Head-normocephalic, atraumatic . Neck Global Assessment - supple, no bruit auscultated on the right, no bruit auscultated on the left.  Eye Pupil - Bilateral-Regular and Round. Motion - Bilateral-EOMI.  Chest and Lung Exam Auscultation Breath sounds - clear at anterior chest wall and clear at posterior chest wall. Adventitious sounds - No Adventitious sounds.  Cardiovascular Auscultation Rhythm - Regular rate and rhythm. Heart Sounds - S1 WNL and S2 WNL. Murmurs & Other Heart Sounds - Auscultation of the heart reveals - No Murmurs.  Abdomen Palpation/Percussion Tenderness - Abdomen is non-tender to palpation. Rigidity (guarding) - Abdomen is soft. Auscultation Auscultation of the abdomen reveals - Bowel sounds normal.  Female Genitourinary Note: Not done, not pertinent to present illness   Musculoskeletal Note: On physical exam, she is alert and oriented. She is in no acute distress. The right hip flexes to 120, internal rotation 20, external rotation abduction 30 degrees. The left hip, she does have some discomfort with passive and active range of motion. Flexion to 110 degrees, internal rotation 10, abduction and external rotation 20 degrees. Calves are soft, nontender. Distal pulses 2+.  RADIOGRAPHS AP and lateral views of the right hip show the prosthesis in good position no periprosthetic abnormalities. The left hip, she does have significant arthritic change with bone-on-bone changes, osteophyte  formation as well as significant cystic formation in the femoral head.  Assessment & Plan Status post right hip replacement (Z61.096(Z96.641) Primary osteoarthritis of left hip (M16.12)  Note:Surgical Plans: Left Total Hip Replacement - Anterior Approach  Disposition: Home with husband Maurine Ministerennis  PCP: Dr. Leona Singletonammy Fulp  IV TXA  Anesthesia Issues: None Avel Peacerew Perkins, PA-C

## 2016-09-06 ENCOUNTER — Encounter (HOSPITAL_COMMUNITY)
Admission: RE | Disposition: A | Discharge status: Home Health | Payer: Self-pay | Source: Ambulatory Visit | Attending: Orthopedic Surgery

## 2016-09-06 ENCOUNTER — Inpatient Hospital Stay (HOSPITAL_COMMUNITY): Payer: Medicare Other | Admitting: Anesthesiology

## 2016-09-06 ENCOUNTER — Encounter (HOSPITAL_COMMUNITY): Payer: Self-pay | Admitting: *Deleted

## 2016-09-06 ENCOUNTER — Inpatient Hospital Stay (HOSPITAL_COMMUNITY): Payer: Medicare Other

## 2016-09-06 ENCOUNTER — Inpatient Hospital Stay (HOSPITAL_COMMUNITY)
Admission: RE | Admit: 2016-09-06 | Discharge: 2016-09-08 | DRG: 470 | Disposition: A | Discharge status: Home Health | Payer: Medicare Other | Source: Ambulatory Visit | Attending: Orthopedic Surgery | Admitting: Orthopedic Surgery

## 2016-09-06 DIAGNOSIS — Z880 Allergy status to penicillin: Secondary | ICD-10-CM | POA: Diagnosis not present

## 2016-09-06 DIAGNOSIS — R32 Unspecified urinary incontinence: Secondary | ICD-10-CM | POA: Diagnosis present

## 2016-09-06 DIAGNOSIS — M169 Osteoarthritis of hip, unspecified: Secondary | ICD-10-CM | POA: Diagnosis present

## 2016-09-06 DIAGNOSIS — Z96649 Presence of unspecified artificial hip joint: Secondary | ICD-10-CM

## 2016-09-06 DIAGNOSIS — M1612 Unilateral primary osteoarthritis, left hip: Secondary | ICD-10-CM | POA: Diagnosis present

## 2016-09-06 DIAGNOSIS — M25562 Pain in left knee: Secondary | ICD-10-CM

## 2016-09-06 HISTORY — PX: TOTAL HIP ARTHROPLASTY: SHX124

## 2016-09-06 LAB — TYPE AND SCREEN
ABO/RH(D): B POS
ANTIBODY SCREEN: NEGATIVE

## 2016-09-06 IMAGING — DX DG PORTABLE PELVIS
1 series · 1 of 1 positions shown · non-contrast
Comparison: [DATE]

CLINICAL DATA: Status post left hip replacement.

EXAM:
PORTABLE PELVIS 1-2 VIEWS

[pelvis ap]
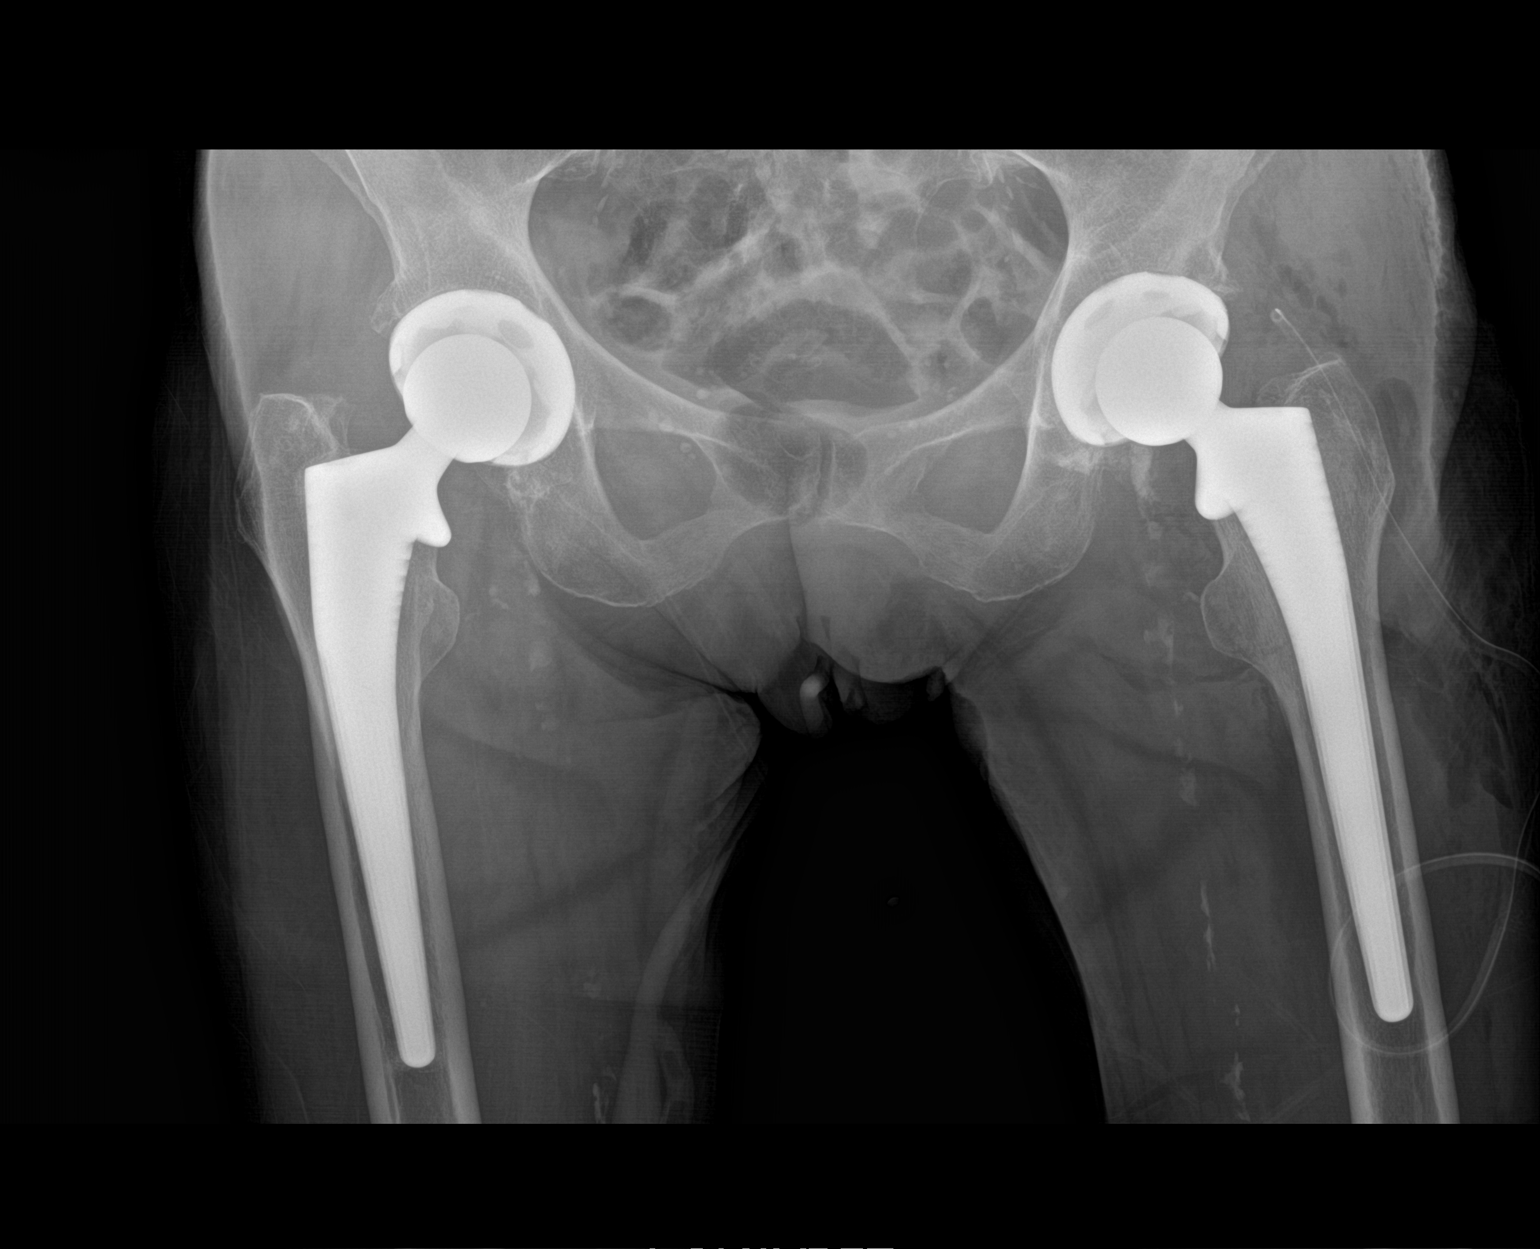

[1 of 1 positions shown; findings below may reference images not displayed]

FINDINGS: Patient has undergone left total hip replacement. Soft tissue drain
noted laterally. Gas in the soft tissues compatible with the
immediate postoperative state. No evidence for hardware
complications. Appearance of the right hip arthroplasty is stable.
IMPRESSION: Status post left total hip replacement without evidence for
immediate hardware complications.

## 2016-09-06 SURGERY — ARTHROPLASTY, HIP, TOTAL, ANTERIOR APPROACH
Anesthesia: Spinal | Site: Hip | Laterality: Left

## 2016-09-06 MED ORDER — MENTHOL 3 MG MT LOZG: 1.0000 | LOZENGE | OROMUCOSAL | Status: DC | PRN

## 2016-09-06 MED ORDER — PROMETHAZINE HCL 25 MG/ML IJ SOLN: 6.2500 mg | INTRAMUSCULAR | Status: DC | PRN

## 2016-09-06 MED ORDER — DIPHENHYDRAMINE HCL 12.5 MG/5ML PO ELIX: 12.5000 mg | ORAL_SOLUTION | ORAL | Status: DC | PRN

## 2016-09-06 MED ORDER — VANCOMYCIN HCL IN DEXTROSE 1-5 GM/200ML-% IV SOLN
INTRAVENOUS | Status: AC
  Filled 2016-09-06: qty 200

## 2016-09-06 MED ORDER — EPHEDRINE SULFATE 50 MG/ML IJ SOLN
INTRAMUSCULAR | Status: DC | PRN
  Administered 2016-09-06 (×4): 5 mg via INTRAVENOUS

## 2016-09-06 MED ORDER — VANCOMYCIN HCL IN DEXTROSE 1-5 GM/200ML-% IV SOLN
1000.0000 mg | INTRAVENOUS | Status: AC
Start: 2016-09-07 — End: 2016-09-06
  Administered 2016-09-06: 1000 mg via INTRAVENOUS
  Filled 2016-09-06: qty 200

## 2016-09-06 MED ORDER — METOCLOPRAMIDE HCL 5 MG/ML IJ SOLN: 5.0000 mg | Freq: Three times a day (TID) | INTRAMUSCULAR | Status: DC | PRN

## 2016-09-06 MED ORDER — TRAMADOL HCL 50 MG PO TABS
50.0000 mg | ORAL_TABLET | Freq: Four times a day (QID) | ORAL | Status: DC | PRN
  Administered 2016-09-07 – 2016-09-08 (×2): 100 mg via ORAL
  Filled 2016-09-06 (×2): qty 2

## 2016-09-06 MED ORDER — BUPIVACAINE HCL (PF) 0.75 % IJ SOLN
INTRAMUSCULAR | Status: DC | PRN
Start: 2016-09-06 — End: 2016-09-06
  Administered 2016-09-06: 1.6 mL via INTRATHECAL

## 2016-09-06 MED ORDER — BISACODYL 10 MG RE SUPP: 10.0000 mg | Freq: Every day | RECTAL | Status: DC | PRN

## 2016-09-06 MED ORDER — SODIUM CHLORIDE 0.9 % IV SOLN
INTRAVENOUS | Status: DC
  Administered 2016-09-06: 20:00:00 via INTRAVENOUS

## 2016-09-06 MED ORDER — DOCUSATE SODIUM 100 MG PO CAPS
100.0000 mg | ORAL_CAPSULE | Freq: Two times a day (BID) | ORAL | Status: DC
  Administered 2016-09-07 – 2016-09-08 (×2): 100 mg via ORAL
  Filled 2016-09-06 (×4): qty 1

## 2016-09-06 MED ORDER — FENTANYL CITRATE (PF) 100 MCG/2ML IJ SOLN
INTRAMUSCULAR | Status: DC | PRN
  Administered 2016-09-06: 100 ug via INTRAVENOUS

## 2016-09-06 MED ORDER — METHOCARBAMOL 500 MG PO TABS
500.0000 mg | ORAL_TABLET | Freq: Four times a day (QID) | ORAL | Status: DC | PRN
  Administered 2016-09-07 – 2016-09-08 (×2): 500 mg via ORAL
  Filled 2016-09-06 (×2): qty 1

## 2016-09-06 MED ORDER — MORPHINE SULFATE (PF) 2 MG/ML IV SOLN: 1.0000 mg | INTRAVENOUS | Status: DC | PRN

## 2016-09-06 MED ORDER — TRANEXAMIC ACID 1000 MG/10ML IV SOLN
1000.0000 mg | INTRAVENOUS | Status: AC
  Administered 2016-09-06: 1000 mg via INTRAVENOUS
  Filled 2016-09-06: qty 10

## 2016-09-06 MED ORDER — CHLORHEXIDINE GLUCONATE 4 % EX LIQD: 60.0000 mL | Freq: Once | CUTANEOUS | Status: DC

## 2016-09-06 MED ORDER — FENTANYL CITRATE (PF) 100 MCG/2ML IJ SOLN
INTRAMUSCULAR | Status: AC
  Filled 2016-09-06: qty 2

## 2016-09-06 MED ORDER — 0.9 % SODIUM CHLORIDE (POUR BTL) OPTIME
TOPICAL | Status: DC | PRN
Start: 2016-09-06 — End: 2016-09-06
  Administered 2016-09-06: 1000 mL

## 2016-09-06 MED ORDER — LACTATED RINGERS IV SOLN
INTRAVENOUS | Status: DC
  Administered 2016-09-06 (×3): via INTRAVENOUS

## 2016-09-06 MED ORDER — BUPIVACAINE HCL (PF) 0.25 % IJ SOLN
INTRAMUSCULAR | Status: DC | PRN
  Administered 2016-09-06: 30 mL

## 2016-09-06 MED ORDER — ONDANSETRON HCL 4 MG/2ML IJ SOLN
4.0000 mg | Freq: Four times a day (QID) | INTRAMUSCULAR | Status: DC | PRN
  Filled 2016-09-06: qty 2

## 2016-09-06 MED ORDER — FENTANYL CITRATE (PF) 100 MCG/2ML IJ SOLN: 25.0000 ug | INTRAMUSCULAR | Status: DC | PRN

## 2016-09-06 MED ORDER — TRANEXAMIC ACID 1000 MG/10ML IV SOLN
1000.0000 mg | Freq: Once | INTRAVENOUS | Status: AC
  Administered 2016-09-06: 1000 mg via INTRAVENOUS
  Filled 2016-09-06: qty 10

## 2016-09-06 MED ORDER — CEFAZOLIN IN D5W 1 GM/50ML IV SOLN
1.0000 g | Freq: Four times a day (QID) | INTRAVENOUS | Status: AC
  Administered 2016-09-06 – 2016-09-07 (×2): 1 g via INTRAVENOUS
  Filled 2016-09-06 (×2): qty 50

## 2016-09-06 MED ORDER — FLEET ENEMA 7-19 GM/118ML RE ENEM: 1.0000 | ENEMA | Freq: Once | RECTAL | Status: DC | PRN

## 2016-09-06 MED ORDER — ACETAMINOPHEN 10 MG/ML IV SOLN
1000.0000 mg | Freq: Once | INTRAVENOUS | Status: AC
  Administered 2016-09-06: 1000 mg via INTRAVENOUS
  Filled 2016-09-06: qty 100

## 2016-09-06 MED ORDER — BUPIVACAINE HCL (PF) 0.25 % IJ SOLN
INTRAMUSCULAR | Status: AC
  Filled 2016-09-06: qty 30

## 2016-09-06 MED ORDER — PROPOFOL 500 MG/50ML IV EMUL
INTRAVENOUS | Status: DC | PRN
  Administered 2016-09-06: 50 ug/kg/min via INTRAVENOUS

## 2016-09-06 MED ORDER — ACETAMINOPHEN 325 MG PO TABS: 650.0000 mg | ORAL_TABLET | Freq: Four times a day (QID) | ORAL | Status: DC | PRN

## 2016-09-06 MED ORDER — PROPOFOL 10 MG/ML IV BOLUS
INTRAVENOUS | Status: AC
  Filled 2016-09-06: qty 40

## 2016-09-06 MED ORDER — ACETAMINOPHEN 10 MG/ML IV SOLN
INTRAVENOUS | Status: AC
  Filled 2016-09-06: qty 100

## 2016-09-06 MED ORDER — ONDANSETRON HCL 4 MG PO TABS
4.0000 mg | ORAL_TABLET | Freq: Four times a day (QID) | ORAL | Status: DC | PRN
  Administered 2016-09-07: 4 mg via ORAL
  Filled 2016-09-06: qty 1

## 2016-09-06 MED ORDER — ACETAMINOPHEN 500 MG PO TABS
1000.0000 mg | ORAL_TABLET | Freq: Four times a day (QID) | ORAL | Status: AC
  Administered 2016-09-07: 1000 mg via ORAL
  Filled 2016-09-06 (×2): qty 2

## 2016-09-06 MED ORDER — ACETAMINOPHEN 650 MG RE SUPP: 650.0000 mg | Freq: Four times a day (QID) | RECTAL | Status: DC | PRN

## 2016-09-06 MED ORDER — OXYCODONE HCL 5 MG PO TABS
5.0000 mg | ORAL_TABLET | ORAL | Status: DC | PRN
  Administered 2016-09-07: 5 mg via ORAL
  Filled 2016-09-06: qty 1

## 2016-09-06 MED ORDER — DEXAMETHASONE SODIUM PHOSPHATE 10 MG/ML IJ SOLN
10.0000 mg | Freq: Once | INTRAMUSCULAR | Status: AC
  Administered 2016-09-06: 10 mg via INTRAVENOUS

## 2016-09-06 MED ORDER — PROPOFOL 500 MG/50ML IV EMUL
INTRAVENOUS | Status: DC | PRN
  Administered 2016-09-06: 30 mg via INTRAVENOUS

## 2016-09-06 MED ORDER — RIVAROXABAN 10 MG PO TABS
10.0000 mg | ORAL_TABLET | Freq: Every day | ORAL | Status: DC
  Administered 2016-09-07 – 2016-09-08 (×2): 10 mg via ORAL
  Filled 2016-09-06 (×2): qty 1

## 2016-09-06 MED ORDER — PHENOL 1.4 % MT LIQD
1.0000 | OROMUCOSAL | Status: DC | PRN
Start: 2016-09-06 — End: 2016-09-08

## 2016-09-06 MED ORDER — METHOCARBAMOL 1000 MG/10ML IJ SOLN
500.0000 mg | Freq: Four times a day (QID) | INTRAVENOUS | Status: DC | PRN
  Filled 2016-09-06: qty 5

## 2016-09-06 MED ORDER — METOCLOPRAMIDE HCL 5 MG PO TABS: 5.0000 mg | ORAL_TABLET | Freq: Three times a day (TID) | ORAL | Status: DC | PRN

## 2016-09-06 MED ORDER — PROPOFOL 10 MG/ML IV BOLUS
INTRAVENOUS | Status: AC
  Filled 2016-09-06: qty 20

## 2016-09-06 MED ORDER — DEXAMETHASONE SODIUM PHOSPHATE 10 MG/ML IJ SOLN
10.0000 mg | Freq: Once | INTRAMUSCULAR | Status: AC
Start: 2016-09-07 — End: 2016-09-07
  Administered 2016-09-07: 10 mg via INTRAVENOUS
  Filled 2016-09-06: qty 1

## 2016-09-06 MED ORDER — POLYETHYLENE GLYCOL 3350 17 G PO PACK: 17.0000 g | PACK | Freq: Every day | ORAL | Status: DC | PRN

## 2016-09-06 SURGICAL SUPPLY — 33 items
BAG DECANTER FOR FLEXI CONT (MISCELLANEOUS) IMPLANT
BAG ZIPLOCK 12X15 (MISCELLANEOUS) IMPLANT
BLADE SAG 18X100X1.27 (BLADE) ×2 IMPLANT
CAPT HIP TOTAL 2 ×2 IMPLANT
CLOTH BEACON ORANGE TIMEOUT ST (SAFETY) ×2 IMPLANT
COVER PERINEAL POST (MISCELLANEOUS) ×2 IMPLANT
DECANTER SPIKE VIAL GLASS SM (MISCELLANEOUS) ×2 IMPLANT
DRAPE STERI IOBAN 125X83 (DRAPES) ×2 IMPLANT
DRAPE U-SHAPE 47X51 STRL (DRAPES) ×4 IMPLANT
DRSG ADAPTIC 3X8 NADH LF (GAUZE/BANDAGES/DRESSINGS) ×2 IMPLANT
DRSG MEPILEX BORDER 4X4 (GAUZE/BANDAGES/DRESSINGS) ×2 IMPLANT
DRSG MEPILEX BORDER 4X8 (GAUZE/BANDAGES/DRESSINGS) ×2 IMPLANT
DURAPREP 26ML APPLICATOR (WOUND CARE) ×2 IMPLANT
ELECT REM PT RETURN 9FT ADLT (ELECTROSURGICAL) ×2
ELECTRODE REM PT RTRN 9FT ADLT (ELECTROSURGICAL) ×1 IMPLANT
EVACUATOR 1/8 PVC DRAIN (DRAIN) ×2 IMPLANT
GLOVE BIO SURGEON STRL SZ7.5 (GLOVE) ×2 IMPLANT
GLOVE BIO SURGEON STRL SZ8 (GLOVE) ×2 IMPLANT
GLOVE BIOGEL PI IND STRL 8 (GLOVE) ×2 IMPLANT
GLOVE BIOGEL PI INDICATOR 8 (GLOVE) ×2
GOWN STRL REUS W/TWL LRG LVL3 (GOWN DISPOSABLE) ×2 IMPLANT
GOWN STRL REUS W/TWL XL LVL3 (GOWN DISPOSABLE) ×2 IMPLANT
PACK ANTERIOR HIP CUSTOM (KITS) ×2 IMPLANT
STRIP CLOSURE SKIN 1/2X4 (GAUZE/BANDAGES/DRESSINGS) ×4 IMPLANT
SUT ETHIBOND NAB CT1 #1 30IN (SUTURE) ×2 IMPLANT
SUT MNCRL AB 4-0 PS2 18 (SUTURE) ×2 IMPLANT
SUT VIC AB 2-0 CT1 27 (SUTURE) ×2
SUT VIC AB 2-0 CT1 TAPERPNT 27 (SUTURE) ×2 IMPLANT
SUT VLOC 180 0 24IN GS25 (SUTURE) ×2 IMPLANT
SYR 50ML LL SCALE MARK (SYRINGE) IMPLANT
TRAY FOLEY CATH 14FRSI W/METER (CATHETERS) ×2 IMPLANT
TRAY FOLEY W/METER SILVER 16FR (SET/KITS/TRAYS/PACK) IMPLANT
YANKAUER SUCT BULB TIP 10FT TU (MISCELLANEOUS) ×2 IMPLANT

## 2016-09-06 NOTE — Transfer of Care (Signed)
Immediate Anesthesia Transfer of Care Note  Patient: Heather Nielsen  Procedure(s) Performed: Procedure(s): LEFT TOTAL HIP ARTHROPLASTY ANTERIOR APPROACH (Left)  Patient Location: PACU  Anesthesia Type:Spinal  Level of Consciousness: awake, alert  and oriented  Airway & Oxygen Therapy: Patient Spontanous Breathing and Patient connected to face mask oxygen  Post-op Assessment: Report given to RN and Post -op Vital signs reviewed and stable  Post vital signs: Reviewed and stable  Last Vitals:  Vitals:   09/06/16 1501  BP: (!) 177/96  Pulse: 85  Resp: 16  Temp: 36.9 C    Last Pain:  Vitals:   09/06/16 1501  TempSrc: Oral      Patients Stated Pain Goal: 3 (09/06/16 1511)  Complications: No apparent anesthesia complications

## 2016-09-06 NOTE — Op Note (Signed)
OPERATIVE REPORT- TOTAL HIP ARTHROPLASTY   PREOPERATIVE DIAGNOSIS: Osteoarthritis of the Left hip.   POSTOPERATIVE DIAGNOSIS: Osteoarthritis of the Left  hip.   PROCEDURE: Left total hip arthroplasty, anterior approach.   SURGEON: Ollen GrossFrank Luda Charbonneau, MD   ASSISTANT: Avel Peacerew Perkins, PA-C  ANESTHESIA:  Spinal  ESTIMATED BLOOD LOSS:- 150 ml   DRAINS: Hemovac x1.   COMPLICATIONS: None   CONDITION: PACU - hemodynamically stable.   BRIEF CLINICAL NOTE: Heather Nielsen is a 82 y.o. female who has advanced end-  stage arthritis of their Left  hip with progressively worsening pain and  dysfunction.The patient has failed nonoperative management and presents for  total hip arthroplasty.   PROCEDURE IN DETAIL: After successful administration of spinal  anesthetic, the traction boots for the [facility] bed were placed on both  feet and the [facility], boots placed into the leg  holders. The Left hip was then isolated from the perineum with plastic  drapes and prepped and draped in the usual sterile fashion. ASIS and  greater trochanter were marked and a oblique incision was made, starting  at about 1 cm lateral and 2 cm distal to the ASIS and coursing towards  the anterior cortex of the femur. The skin was cut with a 10 blade  through subcutaneous tissue to the level of the fascia overlying the  tensor fascia lata muscle. The fascia was then incised in line with the  incision at the junction of the anterior third and posterior 2/3rd. The  muscle was teased off the fascia and then the interval between the TFL  and the rectus was developed. The Hohmann retractor was then placed at  the top of the femoral neck over the capsule. The vessels overlying the  capsule were cauterized and the fat on top of the capsule was removed.  A Hohmann retractor was then placed anterior underneath the rectus  femoris to give exposure to the entire anterior capsule. A T-shaped   capsulotomy was performed. The edges were tagged and the femoral head  was identified.       Osteophytes are removed off the superior acetabulum.  The femoral neck was then cut in situ with an oscillating saw. Traction  was then applied to the left lower extremity utilizing the [facility]  traction. The femoral head was then removed. Retractors were placed  around the acetabulum and then circumferential removal of the labrum was  performed. Osteophytes were also removed. Reaming starts at 45 mm to  medialize and  Increased in 2 mm increments to 49 mm. We reamed in  approximately 40 degrees of abduction, 20 degrees anteversion. A 50 mm  pinnacle acetabular shell was then impacted in anatomic position under  fluoroscopic guidance with excellent purchase. We did not need to place  any additional dome screws. A 32 mm neutral + 4 marathon liner was then  placed into the acetabular shell.       The femoral lift was then placed along the lateral aspect of the femur  just distal to the vastus ridge. The leg was  externally rotated and capsule  was stripped off the inferior aspect of the femoral neck down to the  level of the lesser trochanter, this was done with electrocautery. The femur was lifted after this was performed. The  leg was then placed in an extended and adducted position essentially delivering the femur. We also removed the capsule superiorly and the piriformis from the piriformis  fossa to gain excellent exposure of the  proximal femur. Rongeur was used to remove some cancellous bone to get  into the lateral portion of the proximal femur for placement of the  initial starter reamer. The starter broaches was placed  the starter broach  and was shown to go down the center of the canal. Broaching  with the  Corail system was then performed starting at size 8, coursing  Up to size 14. A size 14 had excellent torsional and rotational  and axial stability. The trial high offset neck was then  placed  with a 32 + 1 trial head. The hip was then reduced. We confirmed that  the stem was in the canal both on AP and lateral x-rays. It also has excellent sizing. The hip was reduced with outstanding stability through full extension and full external rotation.. AP pelvis was taken and the leg lengths were measured and found to be equal. Hip was then dislocated again and the femoral head and neck removed. The  femoral broach was removed. Size 14 Corail stem with a high offset  neck was then impacted into the femur following native anteversion. Has  excellent purchase in the canal. Excellent torsional and rotational and  axial stability. It is confirmed to be in the canal on AP and lateral  fluoroscopic views. The 32 + 1 ceramic head was placed and the hip  reduced with outstanding stability. Again AP pelvis was taken and it  confirmed that the leg lengths were equal. The wound was then copiously  irrigated with saline solution and the capsule reattached and repaired  with Ethibond suture. 30 ml of .25% Bupivicaine was  injected into the capsule and into the edge of the tensor fascia lata as well as subcutaneous tissue. The fascia overlying the tensor fascia lata was then closed with a running #1 V-Loc. Subcu was closed with interrupted 2-0 Vicryl and subcuticular running 4-0 Monocryl. Incision was cleaned  and dried. Steri-Strips and a bulky sterile dressing applied. Hemovac  drain was hooked to suction and then the patient was awakened and transported to  recovery in stable condition.        Please note that a surgical assistant was a medical necessity for this procedure to perform it in a safe and expeditious manner. Assistant was necessary to provide appropriate retraction of vital neurovascular structures and to prevent femoral fracture and allow for anatomic placement of the prosthesis.  Ollen Gross, M.D.

## 2016-09-06 NOTE — Anesthesia Procedure Notes (Signed)
Spinal  Start time: 09/06/2016 3:40 PM End time: 09/06/2016 3:44 PM Staffing Resident/CRNA: Kizzie FantasiaARVER, Karn Derk J Performed: resident/CRNA  Preanesthetic Checklist Completed: patient identified, site marked, surgical consent, pre-op evaluation, timeout performed, IV checked, risks and benefits discussed and monitors and equipment checked Spinal Block Patient position: sitting Prep: Betadine Patient monitoring: heart rate, continuous pulse ox and blood pressure Approach: midline Location: L4-5 Injection technique: single-shot Needle Needle type: Spinocan  Needle gauge: 22 G Needle length: 9 cm Needle insertion depth: 5 cm Additional Notes Pt sitting position sterile prep and drape, negative paresthesia, negative heme.

## 2016-09-06 NOTE — Anesthesia Preprocedure Evaluation (Addendum)
Anesthesia Evaluation  Patient identified by MRN, date of birth, ID band Patient awake    Reviewed: Allergy & Precautions, NPO status , Patient's Chart, lab work & pertinent test results  History of Anesthesia Complications (+) PONV  Airway Mallampati: II  TM Distance: >3 FB Neck ROM: Full    Dental no notable dental hx.    Pulmonary former smoker,    Pulmonary exam normal breath sounds clear to auscultation       Cardiovascular negative cardio ROS Normal cardiovascular exam Rhythm:Regular Rate:Normal     Neuro/Psych negative neurological ROS  negative psych ROS   GI/Hepatic negative GI ROS, Neg liver ROS,   Endo/Other  negative endocrine ROS  Renal/GU negative Renal ROS  negative genitourinary   Musculoskeletal negative musculoskeletal ROS (+)   Abdominal   Peds negative pediatric ROS (+)  Hematology negative hematology ROS (+)   Anesthesia Other Findings   Reproductive/Obstetrics negative OB ROS                             Anesthesia Physical  Anesthesia Plan  ASA: I  Anesthesia Plan: Spinal   Post-op Pain Management:    Induction: Intravenous  Airway Management Planned: Simple Face Mask  Additional Equipment:   Intra-op Plan:   Post-operative Plan:   Informed Consent: I have reviewed the patients History and Physical, chart, labs and discussed the procedure including the risks, benefits and alternatives for the proposed anesthesia with the patient or authorized representative who has indicated his/her understanding and acceptance.   Dental advisory given  Plan Discussed with: CRNA  Anesthesia Plan Comments:        Anesthesia Quick Evaluation

## 2016-09-07 ENCOUNTER — Encounter (HOSPITAL_COMMUNITY): Payer: Self-pay | Admitting: Orthopedic Surgery

## 2016-09-07 LAB — CBC
HEMATOCRIT: 33.3 % — AB (ref 36.0–46.0)
Hemoglobin: 11.9 g/dL — ABNORMAL LOW (ref 12.0–15.0)
MCH: 30.5 pg (ref 26.0–34.0)
MCHC: 35.7 g/dL (ref 30.0–36.0)
MCV: 85.4 fL (ref 78.0–100.0)
Platelets: 244 10*3/uL (ref 150–400)
RBC: 3.9 MIL/uL (ref 3.87–5.11)
RDW: 12 % (ref 11.5–15.5)
WBC: 15.9 10*3/uL — ABNORMAL HIGH (ref 4.0–10.5)

## 2016-09-07 LAB — BASIC METABOLIC PANEL
Anion gap: 8 (ref 5–15)
BUN: 10 mg/dL (ref 6–20)
CALCIUM: 8.5 mg/dL — AB (ref 8.9–10.3)
CO2: 24 mmol/L (ref 22–32)
CREATININE: 0.53 mg/dL (ref 0.44–1.00)
Chloride: 99 mmol/L — ABNORMAL LOW (ref 101–111)
GFR calc Af Amer: >60 mL/min (ref >60)
GFR calc non Af Amer: >60 mL/min (ref >60)
GLUCOSE: 155 mg/dL — AB (ref 65–99)
Potassium: 3.7 mmol/L (ref 3.5–5.1)
Sodium: 131 mmol/L — ABNORMAL LOW (ref 135–145)

## 2016-09-07 MED ORDER — RIVAROXABAN 10 MG PO TABS: 10.0000 mg | ORAL_TABLET | Freq: Every day | ORAL | 0 refills | Status: DC

## 2016-09-07 MED ORDER — ONDANSETRON HCL 4 MG PO TABS: 4.0000 mg | ORAL_TABLET | Freq: Four times a day (QID) | ORAL | 0 refills | Status: DC | PRN

## 2016-09-07 MED ORDER — TRAMADOL HCL 50 MG PO TABS: 50.0000 mg | ORAL_TABLET | Freq: Four times a day (QID) | ORAL | 1 refills | Status: DC | PRN

## 2016-09-07 MED ORDER — METHOCARBAMOL 500 MG PO TABS: 500.0000 mg | ORAL_TABLET | Freq: Four times a day (QID) | ORAL | 0 refills | Status: DC | PRN

## 2016-09-07 MED ORDER — OXYCODONE HCL 5 MG PO TABS: 5.0000 mg | ORAL_TABLET | ORAL | 0 refills | Status: DC | PRN

## 2016-09-07 NOTE — Evaluation (Signed)
Occupational Therapy Evaluation Patient Details Name: Heather Nielsen Nuckles MRN: [id number] DOB: 05/28/1934 Today's Date: 09/07/2016    History of Present Illness s/p L DA THA   Clinical Impression   This 82 year old female was admitted for the above sx.  She was limited by pain during evaluation and was only placing partial weight on LLE.  Pain is in her knee. She will benefit from continued OT to increase safety and independence with bathroom transfers:  She has assistance for adls.  Goals are for supervision to min guard A.  She currently needs min A    Follow Up Recommendations  Supervision/Assistance - 24 hour    Equipment Recommendations   (possibly 3:1 commode)    Recommendations for Other Services       Precautions / Restrictions Precautions Precautions: Fall Restrictions Weight Bearing Restrictions: No      Mobility Bed Mobility               General bed mobility comments: mod A for sit to supine  Transfers Overall transfer level: Needs assistance Equipment used: Rolling walker (2 wheeled) Transfers: Sit to/from [employer]GI CorporationStand;Stand Pivot Transfers Sit to Stand: Min assist Stand pivot transfers: Min assist       General transfer comment: cues for UE/LE placement    Balance                                            ADL Overall ADL's : Needs assistance/impaired     Grooming: Set up;Sitting                   Toilet Transfer: Minimal assistance;BSC;RW;Stand-pivot   Toileting- Clothing Manipulation and Hygiene: Minimal assistance;Sit to/from stand         General ADL Comments: Pt had wanted to walk in to bathroom, but she could not tolerate this.  Took 3 steps then sat on bed and 3:1 brought to her.  Had LOB with clothing management.  Pt states L knee hurts more than hip. She had help with adls after last sx.  Pt fatiqued.  Returned to bed     Vision     Perception     Praxis      Pertinent Vitals/Pain Pain Assessment:  Faces Faces Pain Scale: Hurts whole lot Pain Location: L knee Pain Descriptors / Indicators: Aching Pain Intervention(s): Limited activity within patient's tolerance;Monitored during session;Repositioned;Patient requesting pain meds-RN notified (removed ice; pt cold)     Hand Dominance     Extremity/Trunk Assessment Upper Extremity Assessment Upper Extremity Assessment: Overall WFL for tasks assessed           Communication Communication Communication: No difficulties   Cognition Arousal/Alertness: Awake/alert Behavior During Therapy: WFL for tasks assessed/performed Overall Cognitive Status: Within Functional Limits for tasks assessed                     General Comments       Exercises       Shoulder Instructions      Home Living Family/patient expects to be discharged to:: Private residence Living Arrangements: Spouse/significant other                 Bathroom Shower/Tub: Producer, television/film/videoWalk-in shower   Bathroom Toilet: Standard     Home Equipment: None          Prior Functioning/Environment Level of  Independence: Independent                 OT Problem List: Decreased strength;Decreased activity tolerance;Impaired balance (sitting and/or standing);Decreased knowledge of use of DME or AE;Pain   OT Treatment/Interventions: Self-care/ADL training;DME and/or AE instruction;Patient/family education;Balance training    OT Goals(Current goals can be found in the care plan section) Acute Rehab OT Goals Patient Stated Goal: less pain OT Goal Formulation: With patient Time For Goal Achievement: 09/14/16 Potential to Achieve Goals: Good ADL Goals Pt Will Transfer to Toilet: with supervision;ambulating;bedside commode;regular height toilet (vs) Pt Will Perform Toileting - Clothing Manipulation and hygiene: with supervision;sit to/from stand Pt Will Perform Tub/Shower Transfer: Shower transfer;with min guard assist;ambulating;shower seat  OT Frequency: Min  2X/week   Barriers to Nielsen/C:            Co-evaluation              End of Session Nurse Communication: Patient requests pain meds (not ready for Nielsen/c)  Activity Tolerance: Patient limited by fatigue;Patient limited by pain Patient left: in bed;with call bell/phone within reach;with bed alarm set   Time: 1207-1228 OT Time Calculation (min): 21 min Charges:  OT General Charges $OT Visit: 1 Procedure OT Evaluation $OT Eval Low Complexity: 1 Procedure G-Codes:    Ayonna Speranza 30-Sep-2016, 12:55 PM Marica Otter, OTR/L [phone] 09-30-16

## 2016-09-07 NOTE — Evaluation (Signed)
Physical Therapy Evaluation Patient Details Name: Heather Nielsen MRN: [id number] DOB: 01/04/1934 Today's Date: 09/07/2016   History of Present Illness  s/p L DA THA  Clinical Impression  Pt s/p L THR presents with decreased L LE strength/ROM, dizziness with attempts to ambulate and post op knee and hip pain limiting functional mobility.  Pt should progress to dc home with family assist and HHPT follow up.    Follow Up Recommendations Home health PT    Equipment Recommendations  None recommended by PT    Recommendations for Other Services OT consult     Precautions / Restrictions Precautions Precautions: Fall Precaution Comments: Pt dizzy with attempt to ambulate Restrictions Weight Bearing Restrictions: No Other Position/Activity Restrictions: WBAT      Mobility  Bed Mobility Overal bed mobility: Needs Assistance Bed Mobility: Supine to Sit     Supine to sit: Min assist     General bed mobility comments: cues for sequence and use of R LE to self assist  Transfers Overall transfer level: Needs assistance Equipment used: Rolling walker (2 wheeled) Transfers: Sit to/from Stand Sit to Stand: Min assist;Mod assist Stand pivot transfers: Min assist       General transfer comment: cues for UE/LE placement; min assist to stand but mod assist to sit 2* increasing dizziness and pt having difficulty processing commands  Ambulation/Gait Ambulation/Gait assistance: Min assist;+2 safety/equipment Ambulation Distance (Feet): 11 Feet Assistive device: Rolling walker (2 wheeled) Gait Pattern/deviations: Step-to pattern;Decreased step length - right;Decreased step length - left;Shuffle;Decreased stance time - left     General Gait Details: cues for sequence, posture and position from RW.  DIstance ltd by increasing dizziness   Stairs            Wheelchair Mobility    Modified Rankin (Stroke Patients Only)       Balance                                              Pertinent Vitals/Pain Pain Assessment: 0-10 Pain Score: 7  Faces Pain Scale: Hurts whole lot Pain Location: L knee Pain Descriptors / Indicators: Aching;Sore Pain Intervention(s): Limited activity within patient's tolerance;Monitored during session;Premedicated before session;Ice applied    Home Living Family/patient expects to be discharged to:: Private residence Living Arrangements: Spouse/significant other Available Help at Discharge: Family Type of Home: House Home Access: Stairs to enter Entrance Stairs-Rails: None Entrance Stairs-Number of Steps: 2 Home Layout: One level Home Equipment: Environmental consultantWalker - 2 wheels      Prior Function Level of Independence: Independent               Hand Dominance        Extremity/Trunk Assessment   Upper Extremity Assessment: Overall WFL for tasks assessed           Lower Extremity Assessment: LLE deficits/detail   LLE Deficits / Details: Strength at hip 2+/5 with AAROM at hip to 90 flex and 15 abd, AROM at knee to ~ 90  Cervical / Trunk Assessment: Normal  Communication   Communication: No difficulties  Cognition Arousal/Alertness: Awake/alert Behavior During Therapy: WFL for tasks assessed/performed Overall Cognitive Status: Within Functional Limits for tasks assessed                      General Comments      Exercises Total Joint Exercises Ankle  Circles/Pumps: AROM;Both;15 reps;Supine Quad Sets: AROM;Both;10 reps;Supine Heel Slides: AAROM;Left;Supine;15 reps Hip ABduction/ADduction: AAROM;Left;15 reps;Supine   Assessment/Plan    PT Assessment Patient needs continued PT services  PT Problem List Decreased strength;Decreased range of motion;Decreased activity tolerance;Decreased balance;Decreased mobility;Decreased knowledge of use of DME;Pain;Decreased safety awareness          PT Treatment Interventions DME instruction;Gait training;Stair training;Therapeutic exercise;Functional  mobility training;Therapeutic activities;Patient/family education    PT Goals (Current goals can be found in the Care Plan section)  Acute Rehab PT Goals Patient Stated Goal: home PT Goal Formulation: With patient Time For Goal Achievement: 09/12/16 Potential to Achieve Goals: Good    Frequency 7X/week   Barriers to discharge        Co-evaluation               End of Session Equipment Utilized During Treatment: Gait belt Activity Tolerance: Other (comment);Patient limited by fatigue (dizzy with ambulation - RN aware) Patient left: in chair;with call bell/phone within reach;with nursing/sitter in room Nurse Communication: Mobility status         Time: 1010-1040 PT Time Calculation (min) (ACUTE ONLY): 30 min   Charges:   PT Evaluation $PT Eval Low Complexity: 1 Procedure PT Treatments $Therapeutic Exercise: 8-22 mins   PT G Codes:        Joshoa Shawler 10-04-16, 1:09 PM

## 2016-09-07 NOTE — Care Management Note (Signed)
Case Management Note  Patient Details  Name: Heather Nielsen MRN: [id number] Date of Birth: [birth year]  Subjective/Objective:                  LEFT TOTAL HIP ARTHROPLASTY ANTERIOR APPROACH (Left) Action/Plan: Discharge planning Expected Discharge Date:                 Expected Discharge Plan:  [place]  In-House Referral:     Discharge planning Services  CM Consult  Post Acute Care Choice:  Home Health Choice offered to:  Patient  DME Arranged:  N/A DME Agency:  NA  HH Arranged:  PT [place] Agency:  [facility] ([facility]  Status of Service:  Completed, signed off  If discussed at [employer], dates discussed:    Additional Comments: CM met with pt in room to offer choice of home health agency.  Pt chooses [facility] to render HHPT.  Referral given to [facility] rep, Tim.  Pt states she has all DME needed at home.  No other CM needs were communicated. Dellie Catholic, RN 09/07/2016, 12:52 PM

## 2016-09-07 NOTE — Progress Notes (Signed)
Physical Therapy Treatment Patient Details Name: Heather Nielsen MRN: [id number] DOB: Oct 05, 1934 Today's Date: 09/07/2016    History of Present Illness s/p L DA THA    PT Comments    Pt continues motivated but ltd by increasing dizziness with attempts to ambulate and increasing L knee pain with attempts to weight bear.  BP supine 154/62, sit 151/70, stand 151/67 and sitting after ambulating ltd distance and c/o increasing dizziness 152/65 - RN aware.  Follow Up Recommendations  Home health PT     Equipment Recommendations  None recommended by PT    Recommendations for Other Services OT consult     Precautions / Restrictions Precautions Precautions: Fall Precaution Comments: Pt dizzy with attempt to ambulate Restrictions Weight Bearing Restrictions: No Other Position/Activity Restrictions: WBAT    Mobility  Bed Mobility Overal bed mobility: Needs Assistance Bed Mobility: Sit to Supine       Sit to supine: Min assist   General bed mobility comments: cues for sequence and use of R LE to self assist  Transfers Overall transfer level: Needs assistance Equipment used: Rolling walker (2 wheeled) Transfers: Sit to/from Stand Sit to Stand: Min assist Stand pivot transfers: Min assist       General transfer comment: cues for UE/LE placement; min assist to stand but mod assist to sit 2* increasing dizziness and pt having difficulty processing commands  Ambulation/Gait Ambulation/Gait assistance: Min assist;Mod assist Ambulation Distance (Feet): 7 Feet Assistive device: Rolling walker (2 wheeled) Gait Pattern/deviations: Step-to pattern;Decreased stance time - left;Decreased step length - right;Shuffle Gait velocity: decr Gait velocity interpretation: Below normal speed for age/gender General Gait Details: cues for sequence, posture and position from RW.  DIstance ltd by increasing dizziness and increasing L knee pain   Stairs            Wheelchair Mobility     Modified Rankin (Stroke Patients Only)       Balance                                    Cognition Arousal/Alertness: Awake/alert Behavior During Therapy: WFL for tasks assessed/performed Overall Cognitive Status: Within Functional Limits for tasks assessed                      Exercises      General Comments        Pertinent Vitals/Pain Pain Assessment: 0-10 Pain Score: 9  Pain Location: L knee (hip feels great) Pain Descriptors / Indicators: Aching;Sore;Sharp Pain Intervention(s): Limited activity within patient's tolerance;Monitored during session;Premedicated before session;Ice applied    Home Living                      Prior Function            PT Goals (current goals can now be found in the care plan section) Acute Rehab PT Goals Patient Stated Goal: home PT Goal Formulation: With patient Time For Goal Achievement: 09/12/16 Potential to Achieve Goals: Good Progress towards PT goals: Progressing toward goals    Frequency    7X/week      PT Plan Current plan remains appropriate    Co-evaluation             End of Session Equipment Utilized During Treatment: Gait belt Activity Tolerance: Patient limited by pain;Other (comment) (dizziness) Patient left: in bed;with call bell/phone within reach;with family/visitor present  Time: [id number] PT Time Calculation (min) (ACUTE ONLY): 33 min  Charges:  $Gait Training: 8-22 mins $Therapeutic Activity: 8-22 mins                    G Codes:      Edwardo Wojnarowski 09/07/2016, 5:32 PM

## 2016-09-07 NOTE — Progress Notes (Signed)
   Subjective: 1 Day Post-Op Procedure(s) (LRB): LEFT TOTAL HIP ARTHROPLASTY ANTERIOR APPROACH (Left) Patient reports pain as mild.   Patient seen in rounds by Dr. Lequita HaltAluisio. Patient is well, and has had no acute complaints or problems We will start therapy today.  If they do well with therapy and meets all goals, then will allow home later this afternoon following therapy. Plan is to go Home after hospital stay.  Objective: Vital signs in last 24 hours: Temp:  [97.6 F (36.4 C)-98.7 F (37.1 C)] 98.7 F (37.1 C) (09/21 0552) Pulse Rate:  [67-85] 70 (09/21 0552) Resp:  [13-19] 16 (09/21 0552) BP: (123-177)/(60-96) 123/67 (09/21 0552) SpO2:  [96 %-100 %] 97 % (09/21 0552) Weight:  [47.2 kg (104 lb)] 47.2 kg (104 lb) (09/20 1511)  Intake/Output from previous day:  Intake/Output Summary (Last 24 hours) at 09/07/16 0812 Last data filed at 09/07/16 0600  Gross per 24 hour  Intake          2746.25 ml  Output             2070 ml  Net           676.25 ml    Intake/Output this shift: No intake/output data recorded.  Labs:  Recent Labs  09/04/16 1430 09/07/16 0458  HGB 13.5 11.9*    Recent Labs  09/04/16 1430 09/07/16 0458  WBC 6.4 15.9*  RBC 4.34 3.90  HCT 38.6 33.3*  PLT 317 244    Recent Labs  09/04/16 1430 09/07/16 0458  NA 133* 131*  K 4.1 3.7  CL 100* 99*  CO2 26 24  BUN 15 10  CREATININE 0.63 0.53  GLUCOSE 103* 155*  CALCIUM 9.0 8.5*    Recent Labs  09/04/16 1430  INR 0.93    EXAM General - Patient is Alert, Appropriate and Oriented Extremity - Neurovascular intact Sensation intact distally Dressing - dressing C/D/I Motor Function - intact, moving foot and toes well on exam.  Hemovac pulled without difficulty.  Past Medical History:  Diagnosis Date  . Arthritis   . Complication of anesthesia   . PONV (postoperative nausea and vomiting)   . Urinary incontinence     Assessment/Plan: 1 Day Post-Op Procedure(s) (LRB): LEFT TOTAL HIP  ARTHROPLASTY ANTERIOR APPROACH (Left) Active Problems:   OA (osteoarthritis) of hip  Estimated body mass index is 19.02 kg/m as calculated from the following:   Height as of this encounter: 5\' 2"  (1.575 m).   Weight as of this encounter: 47.2 kg (104 lb). Up with therapy Discharge home with home health  DVT Prophylaxis - Xarelto Weight Bearing As Tolerated left Leg Hemovac Pulled Begin Therapy  If meets goals and able to go home: Up with therapy Discharge home with home health Diet - Regular diet Follow up - in 2 weeks Activity - WBAT Disposition - Home Condition Upon Discharge - Good D/C Meds - See DC Summary DVT Prophylaxis - Xarelto  Avel Peacerew Christyanna Mckeon, PA-C Orthopaedic Surgery 09/07/2016, 8:12 AM

## 2016-09-07 NOTE — Discharge Summary (Signed)
Physician Discharge Summary   Patient ID: Heather Nielsen MRN: [id number] DOB/AGE: 1934-03-12 82 y.o.  Admit date: 09/06/2016 Discharge date: 09/08/2016  Primary Diagnosis:  Osteoarthritis of the Left hip.  Admission Diagnoses:  Past Medical History:  Diagnosis Date  . Arthritis   . Complication of anesthesia   . PONV (postoperative nausea and vomiting)   . Urinary incontinence    Discharge Diagnoses:   Active Problems:   OA (osteoarthritis) of hip  Estimated body mass index is 19.02 kg/m as calculated from the following:   Height as of this encounter: _0  (1.575 m).   Weight as of this encounter: 47.2 kg (104 lb).  Procedure(s) (LRB): LEFT TOTAL HIP ARTHROPLASTY ANTERIOR APPROACH (Left)   Consults: None  HPI: Heather Nielsen is a 82 y.o. female who has advanced end-  stage arthritis of their Left  hip with progressively worsening pain and  dysfunction.The patient has failed nonoperative management and presents for  total hip arthroplasty.   Laboratory Data: Admission on 09/06/2016  Component Date Value Ref Range Status  . WBC 09/07/2016 15.9* 4.0 - 10.5 K/uL Final  . RBC 09/07/2016 3.90  3.87 - 5.11 MIL/uL Final  . Hemoglobin 09/07/2016 11.9* 12.0 - 15.0 g/dL Final  . HCT 09/07/2016 33.3* 36.0 - 46.0 % Final  . MCV 09/07/2016 85.4  78.0 - 100.0 fL Final  . MCH 09/07/2016 30.5  26.0 - 34.0 pg Final  . MCHC 09/07/2016 35.7  30.0 - 36.0 g/dL Final  . RDW 09/07/2016 12.0  11.5 - 15.5 % Final  . Platelets 09/07/2016 244  150 - 400 K/uL Final  . Sodium 09/07/2016 131* 135 - 145 mmol/L Final  . Potassium 09/07/2016 3.7  3.5 - 5.1 mmol/L Final  . Chloride 09/07/2016 99* 101 - 111 mmol/L Final  . CO2 09/07/2016 24  22 - 32 mmol/L Final  . Glucose, Bld 09/07/2016 155* 65 - 99 mg/dL Final  . BUN 09/07/2016 10  6 - 20 mg/dL Final  . Creatinine, Ser 09/07/2016 0.53  0.44 - 1.00 mg/dL Final  . Calcium 09/07/2016 8.5* 8.9 - 10.3 mg/dL Final  . GFR calc non Af Amer 09/07/2016  >60  >60 mL/min Final  . GFR calc Af Amer 09/07/2016 >60  >60 mL/min Final   Comment: (NOTE) The eGFR has been calculated using the CKD EPI equation. This calculation has not been validated in all clinical situations. eGFR's persistently <60 mL/min signify possible Chronic Kidney Disease.   Georgiann Hahn gap 09/07/2016 8  5 - 15 Final  Hospital Outpatient Visit on 09/04/2016  Component Date Value Ref Range Status  . aPTT 09/04/2016 28  24 - 36 seconds Final  . WBC 09/04/2016 6.4  4.0 - 10.5 K/uL Final  . RBC 09/04/2016 4.34  3.87 - 5.11 MIL/uL Final  . Hemoglobin 09/04/2016 13.5  12.0 - 15.0 g/dL Final  . HCT 09/04/2016 38.6  36.0 - 46.0 % Final  . MCV 09/04/2016 88.9  78.0 - 100.0 fL Final  . MCH 09/04/2016 31.1  26.0 - 34.0 pg Final  . MCHC 09/04/2016 35.0  30.0 - 36.0 g/dL Final  . RDW 09/04/2016 12.5  11.5 - 15.5 % Final  . Platelets 09/04/2016 317  150 - 400 K/uL Final  . Sodium 09/04/2016 133* 135 - 145 mmol/L Final  . Potassium 09/04/2016 4.1  3.5 - 5.1 mmol/L Final  . Chloride 09/04/2016 100* 101 - 111 mmol/L Final  . CO2 09/04/2016 26  22 - 32 mmol/L  Final  . Glucose, Bld 09/04/2016 103* 65 - 99 mg/dL Final  . BUN 09/04/2016 15  6 - 20 mg/dL Final  . Creatinine, Ser 09/04/2016 0.63  0.44 - 1.00 mg/dL Final  . Calcium 09/04/2016 9.0  8.9 - 10.3 mg/dL Final  . Total Protein 09/04/2016 7.0  6.5 - 8.1 g/dL Final  . Albumin 09/04/2016 4.1  3.5 - 5.0 g/dL Final  . AST 09/04/2016 24  15 - 41 U/L Final  . ALT 09/04/2016 13* 14 - 54 U/L Final  . Alkaline Phosphatase 09/04/2016 67  38 - 126 U/L Final  . Total Bilirubin 09/04/2016 0.4  0.3 - 1.2 mg/dL Final  . GFR calc non Af Amer 09/04/2016 >60  >60 mL/min Final  . GFR calc Af Amer 09/04/2016 >60  >60 mL/min Final   Comment: (NOTE) The eGFR has been calculated using the CKD EPI equation. This calculation has not been validated in all clinical situations. eGFR's persistently <60 mL/min signify possible Chronic Kidney Disease.   .  Anion gap 09/04/2016 7  5 - 15 Final  . Prothrombin Time 09/04/2016 12.5  11.4 - 15.2 seconds Final  . INR 09/04/2016 0.93   Final  . ABO/RH(D) 09/06/2016 B POS   Final  . Antibody Screen 09/06/2016 NEG   Final  . Sample Expiration 09/06/2016 09/09/2016   Final  . Extend sample reason 09/06/2016 NO TRANSFUSIONS OR PREGNANCY IN THE PAST 3 MONTHS   Final  . Color, Urine 09/04/2016 YELLOW  YELLOW Final  . APPearance 09/04/2016 CLOUDY* CLEAR Final  . Specific Gravity, Urine 09/04/2016 1.028  1.005 - 1.030 Final  . pH 09/04/2016 6.0  5.0 - 8.0 Final  . Glucose, UA 09/04/2016 NEGATIVE  NEGATIVE mg/dL Final  . Hgb urine dipstick 09/04/2016 SMALL* NEGATIVE Final  . Bilirubin Urine 09/04/2016 NEGATIVE  NEGATIVE Final  . Ketones, ur 09/04/2016 NEGATIVE  NEGATIVE mg/dL Final  . Protein, ur 09/04/2016 NEGATIVE  NEGATIVE mg/dL Final  . Nitrite 09/04/2016 NEGATIVE  NEGATIVE Final  . Leukocytes, UA 09/04/2016 NEGATIVE  NEGATIVE Final  . MRSA, PCR 09/04/2016 NEGATIVE  NEGATIVE Final  . Staphylococcus aureus 09/04/2016 NEGATIVE  NEGATIVE Final   Comment:        The Xpert SA Assay (FDA approved for NASAL specimens in patients over 49 years of age), is one component of a comprehensive surveillance program.  Test performance has been validated by [facility] for patients greater than or equal to 39 year old. It is not intended to diagnose infection nor to guide or monitor treatment.   . Squamous Epithelial / LPF 09/04/2016 0-5* NONE SEEN Final  . WBC, UA 09/04/2016 0-5  0 - 5 WBC/hpf Final  . RBC / HPF 09/04/2016 0-5  0 - 5 RBC/hpf Final  . Bacteria, UA 09/04/2016 RARE* NONE SEEN Final  . Crystals 09/04/2016 CA OXALATE CRYSTALS* NEGATIVE Final  . Urine-Other 09/04/2016 MUCOUS PRESENT   Final     X-Rays:Dg Pelvis Portable  Result Date: 09/06/2016 CLINICAL DATA:  Status post left hip replacement. EXAM: PORTABLE PELVIS 1-2 VIEWS COMPARISON:  06/16/2015 FINDINGS: Patient has undergone left total  hip replacement. Soft tissue drain noted laterally. Gas in the soft tissues compatible with the immediate postoperative state. No evidence for hardware complications. Appearance of the right hip arthroplasty is stable. IMPRESSION: Status post left total hip replacement without evidence for immediate hardware complications. Electronically Signed   By: Misty Stanley M.D.   On: 09/06/2016 18:17   Dg C-arm 1-60 Min-no Report  Result Date: 09/06/2016 CLINICAL DATA: hip C-ARM 1-60 MINUTES Fluoroscopy was utilized by the requesting physician.  No radiographic interpretation.    EKG: Orders placed or performed in visit on 06/08/16  . EKG 12-Lead     Hospital Course: Patient was admitted to [facility] and taken to the OR and underwent the above state procedure without complications.  Patient tolerated the procedure well and was later transferred to the recovery room and then to the orthopaedic floor for postoperative care.  They were given PO and IV analgesics for pain control following their surgery.  They were given 24 hours of postoperative antibiotics of  Anti-infectives    Start     Dose/Rate Route Frequency Ordered Stop   09/07/16 0600  vancomycin (VANCOCIN) IVPB 1000 mg/200 mL premix     1,000 mg 200 mL/hr over 60 Minutes Intravenous On call to O.R. 09/06/16 1456 09/06/16 1625   09/06/16 2200  ceFAZolin (ANCEF) IVPB 1 g/50 mL premix     1 g 100 mL/hr over 30 Minutes Intravenous Every 6 hours 09/06/16 1900 09/07/16 0430     and started on DVT prophylaxis in the form of Xarelto.   PT and OT were ordered for total hip protocol.  The patient was allowed to be WBAT with therapy. Discharge planning was consulted to help with postop disposition and equipment needs.  Patient had a good night on the evening of surgery.  They started to get up OOB with therapy on day one.  Hemovac drain was pulled without difficulty.  Continued to work with therapy into day two.  Dressing was changed on day two  and the incision was healing well.   Patient was seen in rounds and was ready to go home.   Discharge home with home health Diet - Regular diet Follow up - in 2 weeks Activity - WBAT Disposition - Home Condition Upon Discharge - stable at this time. Home if improved with therapy today. D/C Meds - See DC Summary DVT Prophylaxis - Xarelto  Discharge Instructions    Call MD / Call 911    Complete by:  As directed    If you experience chest pain or shortness of breath, CALL 911 and be transported to the hospital emergency room.  If you develope a fever above 101 F, pus (white drainage) or increased drainage or redness at the wound, or calf pain, call your surgeon's office.   Change dressing    Complete by:  As directed    You may change your dressing dressing daily with sterile 4 x 4 inch gauze dressing and paper tape.  Do not submerge the incision under water.   Constipation Prevention    Complete by:  As directed    Drink plenty of fluids.  Prune juice may be helpful.  You may use a stool softener, such as Colace (over the counter) 100 mg twice a day.  Use MiraLax (over the counter) for constipation as needed.   Diet general    Complete by:  As directed    Discharge instructions    Complete by:  As directed    Pick up stool softner and laxative for home use following surgery while on pain medications. Do not submerge incision under water. Please use good hand washing techniques while changing dressing each day. May shower starting three days after surgery. Please use a clean towel to pat the incision dry following showers. Continue to use ice for pain and swelling after surgery. Do not  use any lotions or creams on the incision until instructed by your surgeon.   Postoperative Constipation Protocol  Constipation - defined medically as fewer than three stools per week and severe constipation as less than one stool per week.  One of the most common issues patients have following  surgery is constipation.  Even if you have a regular bowel pattern at home, your normal regimen is likely to be disrupted due to multiple reasons following surgery.  Combination of anesthesia, postoperative narcotics, change in appetite and fluid intake all can affect your bowels.  In order to avoid complications following surgery, here are some recommendations in order to help you during your recovery period.  Colace (docusate) - Pick up an over-the-counter form of Colace or another stool softener and take twice a day as long as you are requiring postoperative pain medications.  Take with a full glass of water daily.  If you experience loose stools or diarrhea, hold the colace until you stool forms back up.  If your symptoms do not get better within 1 week or if they get worse, check with your doctor.  Dulcolax (bisacodyl) - Pick up over-the-counter and take as directed by the product packaging as needed to assist with the movement of your bowels.  Take with a full glass of water.  Use this product as needed if not relieved by Colace only.   MiraLax (polyethylene glycol) - Pick up over-the-counter to have on hand.  MiraLax is a solution that will increase the amount of water in your bowels to assist with bowel movements.  Take as directed and can mix with a glass of water, juice, soda, coffee, or tea.  Take if you go more than two days without a movement. Do not use MiraLax more than once per day. Call your doctor if you are still constipated or irregular after using this medication for 7 days in a row.  If you continue to have problems with postoperative constipation, please contact the office for further assistance and recommendations.  If you experience "the worst abdominal pain ever" or develop nausea or vomiting, please contact the office immediatly for further recommendations for treatment.   Take Xarelto for two and a half more weeks, then discontinue Xarelto. Once the patient has completed the  blood thinner regimen, then take a Baby 81 mg Aspirin daily for three more weeks.   Do not sit on low chairs, stoools or toilet seats, as it may be difficult to get up from low surfaces    Complete by:  As directed    Driving restrictions    Complete by:  As directed    No driving until released by the physician.   Increase activity slowly as tolerated    Complete by:  As directed    Lifting restrictions    Complete by:  As directed    No lifting until released by the physician.   Patient may shower    Complete by:  As directed    You may shower without a dressing once there is no drainage.  Do not wash over the wound.  If drainage remains, do not shower until drainage stops.   TED hose    Complete by:  As directed    Use stockings (TED hose) for 3 weeks on both leg(s).  You may remove them at night for sleeping.   Weight bearing as tolerated    Complete by:  As directed    Laterality:  left   Extremity:  Lower       Medication List    TAKE these medications   methocarbamol 500 MG tablet Commonly known as:  ROBAXIN Take 1 tablet (500 mg total) by mouth every 6 (six) hours as needed for muscle spasms.   ondansetron 4 MG tablet Commonly known as:  ZOFRAN Take 1 tablet (4 mg total) by mouth every 6 (six) hours as needed for nausea.   oxyCODONE 5 MG immediate release tablet Commonly known as:  Oxy IR/ROXICODONE Take 1-2 tablets (5-10 mg total) by mouth every 3 (three) hours as needed for breakthrough pain.   rivaroxaban 10 MG Tabs tablet Commonly known as:  XARELTO Take 1 tablet (10 mg total) by mouth daily with breakfast. Take Xarelto for two and a half more weeks, then discontinue Xarelto. Once the patient has completed the blood thinner regimen, then take a Baby 81 mg Aspirin daily for three more weeks.   traMADol 50 MG tablet Commonly known as:  ULTRAM Take 1-2 tablets (50-100 mg total) by mouth every 6 (six) hours as needed for moderate pain. Take Xarelto for two and a  half more weeks, then discontinue Xarelto. Once the patient has completed the blood thinner regimen, then take a Baby 81 mg Aspirin daily for three more weeks.      Follow-up Information    Gearlean Alf, MD. Schedule an appointment as soon as possible for a visit on 09/19/2016.   Specialty:  Orthopedic Surgery Contact information: [address] [place] [address] [phone]           Signed: Arlee Muslim, PA-C Orthopaedic Surgery 09/07/2016, 8:22 AM

## 2016-09-08 ENCOUNTER — Inpatient Hospital Stay (HOSPITAL_COMMUNITY): Payer: Medicare Other

## 2016-09-08 LAB — BASIC METABOLIC PANEL
ANION GAP: 5 (ref 5–15)
BUN: 7 mg/dL (ref 6–20)
CHLORIDE: 107 mmol/L (ref 101–111)
CO2: 25 mmol/L (ref 22–32)
Calcium: 8.5 mg/dL — ABNORMAL LOW (ref 8.9–10.3)
Creatinine, Ser: 0.45 mg/dL (ref 0.44–1.00)
GFR calc non Af Amer: >60 mL/min (ref >60)
Glucose, Bld: 122 mg/dL — ABNORMAL HIGH (ref 65–99)
Potassium: 3.7 mmol/L (ref 3.5–5.1)
Sodium: 137 mmol/L (ref 135–145)

## 2016-09-08 LAB — CBC
HEMATOCRIT: 33.2 % — AB (ref 36.0–46.0)
HEMOGLOBIN: 11.6 g/dL — AB (ref 12.0–15.0)
MCH: 30.9 pg (ref 26.0–34.0)
MCHC: 34.9 g/dL (ref 30.0–36.0)
MCV: 88.3 fL (ref 78.0–100.0)
Platelets: 251 10*3/uL (ref 150–400)
RBC: 3.76 MIL/uL — ABNORMAL LOW (ref 3.87–5.11)
RDW: 12.6 % (ref 11.5–15.5)
WBC: 14.1 10*3/uL — AB (ref 4.0–10.5)

## 2016-09-08 IMAGING — DX DG FEMUR 2+V*L*
4 series · 4 of 4 positions shown · non-contrast
Comparison: Pelvis radiograph from 2 days ago

CLINICAL DATA: Acute postoperative pain.

EXAM:
LEFT FEMUR 2 VIEWS

[femur ap (1 of 2)]
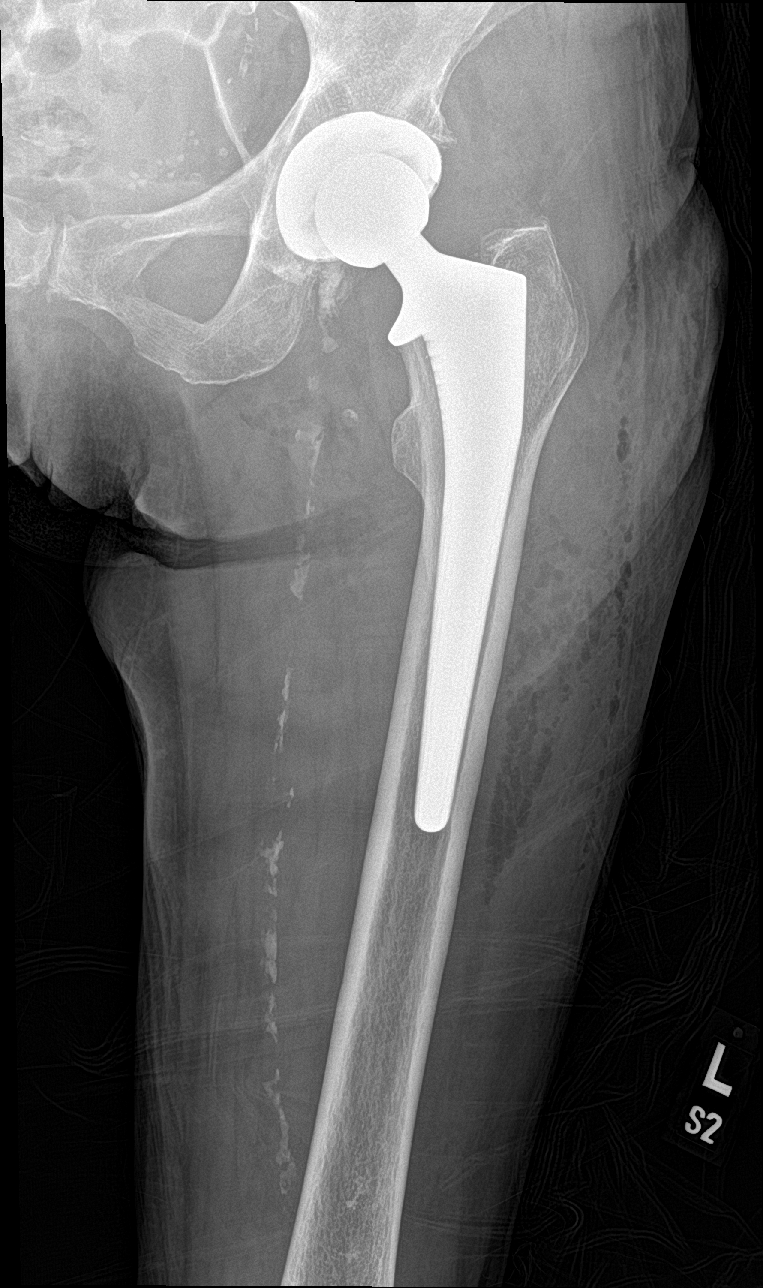

[femur ap (2 of 2)]
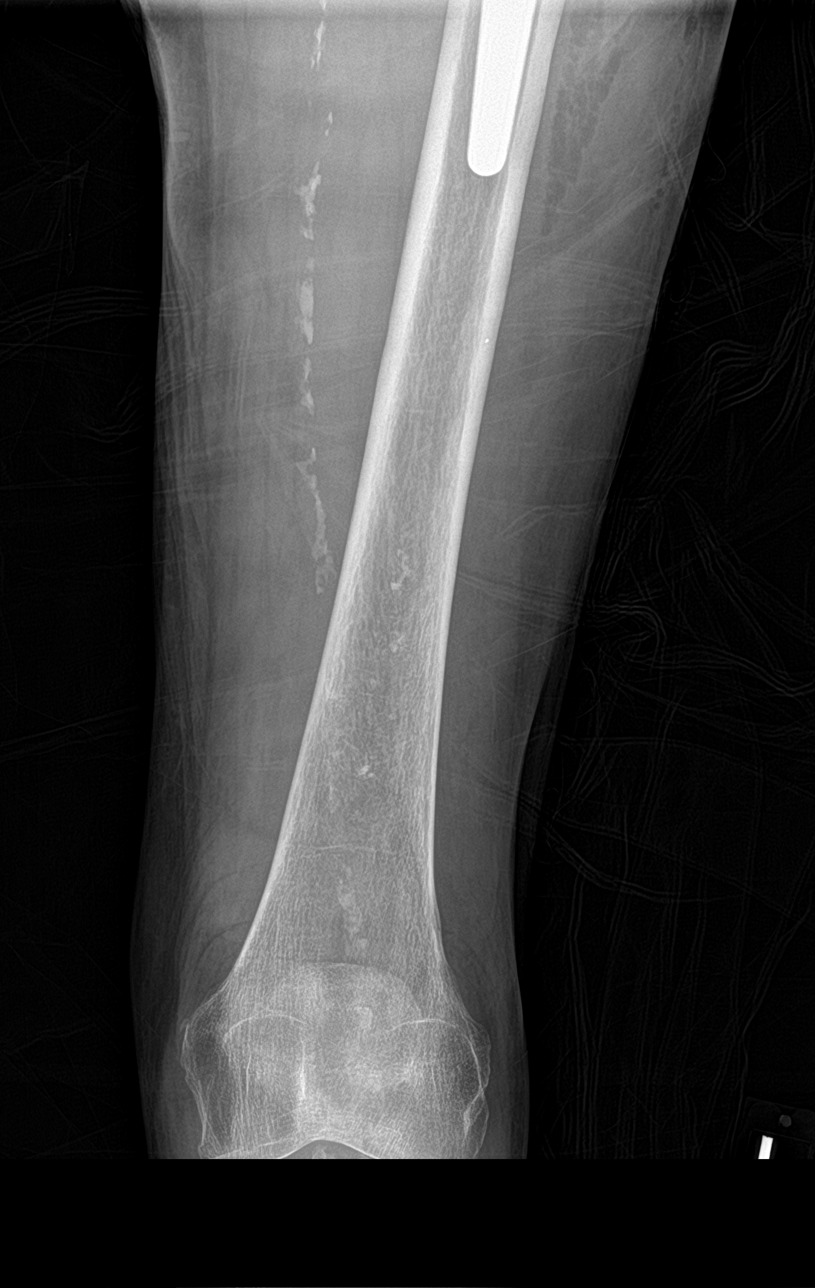

[femur lat (1 of 2)]
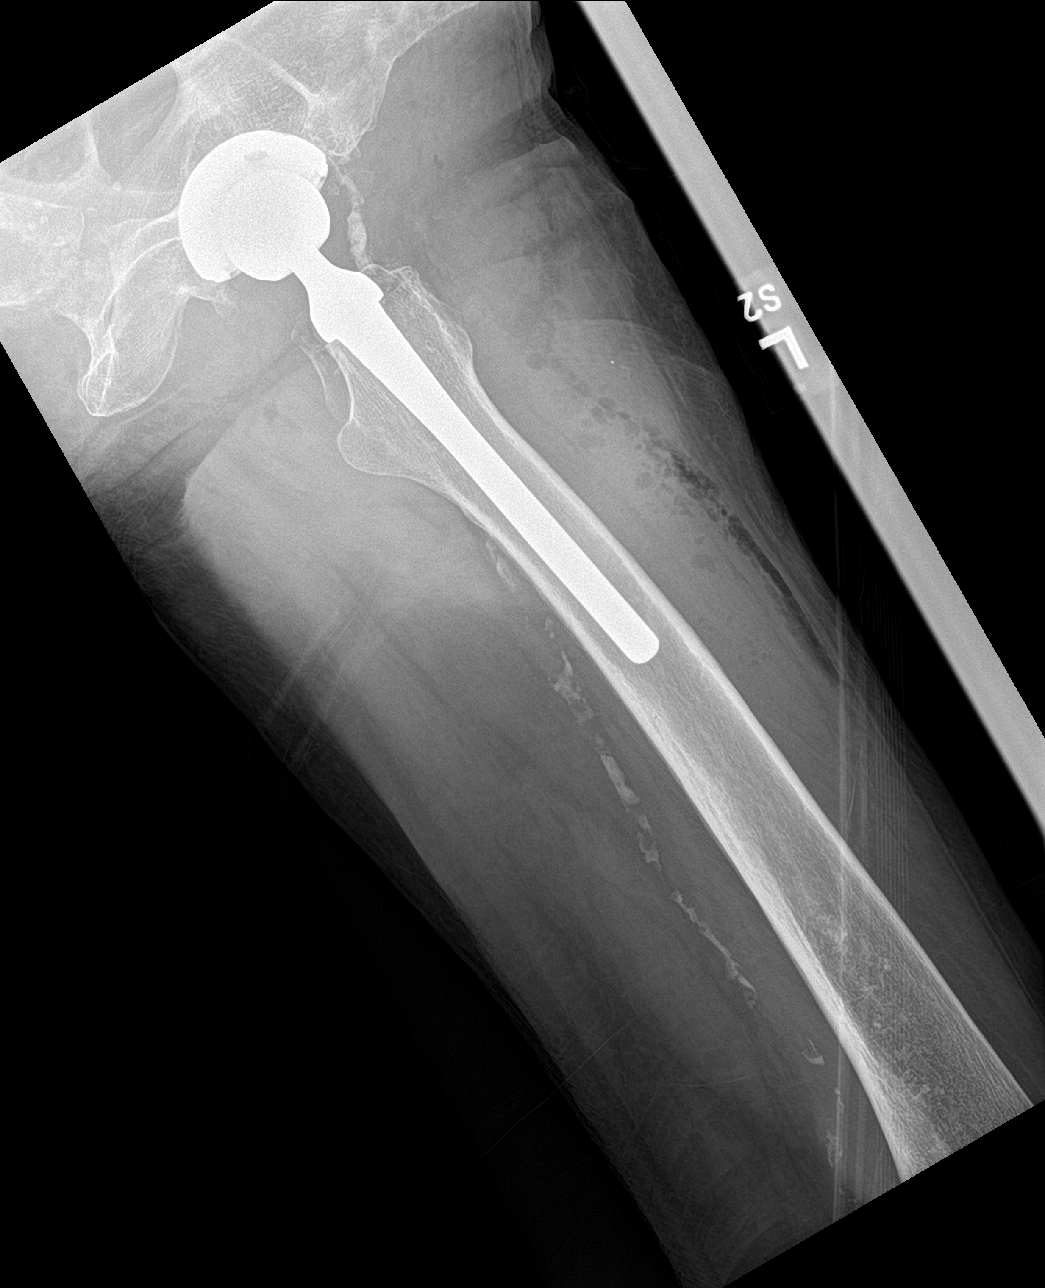

[femur lat (2 of 2)]
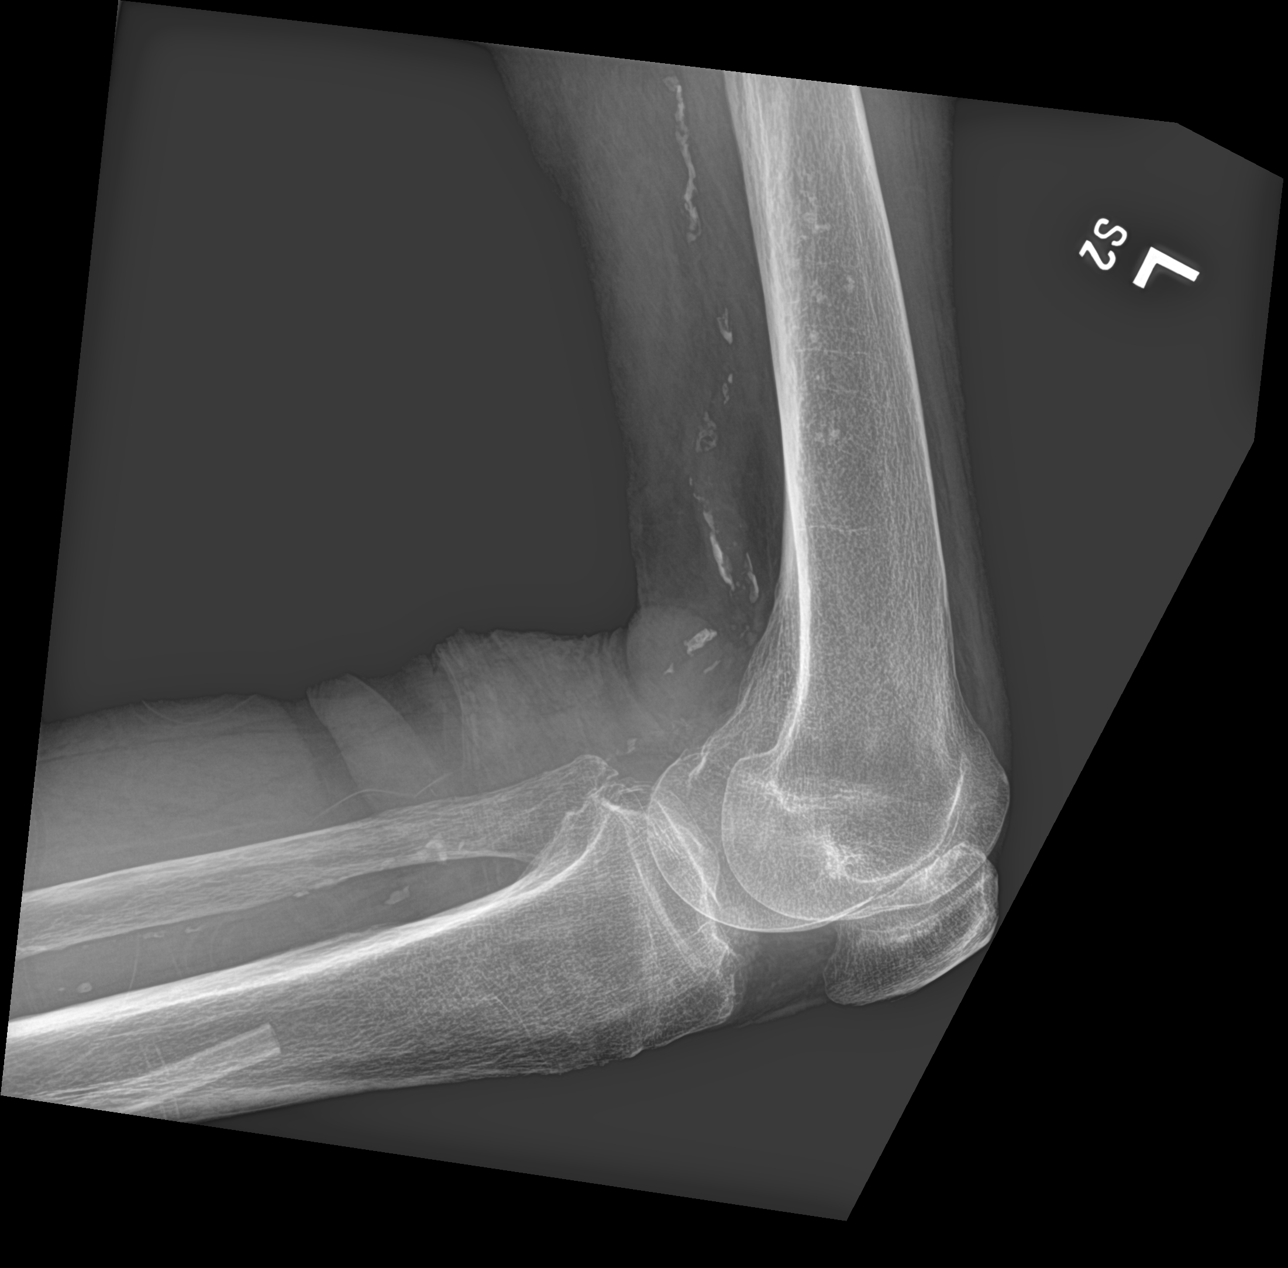

[4 of 4 positions shown; findings below may reference images not displayed]

FINDINGS: Total left hip arthroplasty is located. No evidence of
periprosthetic fracture. Expected soft tissue gas in the surgical
region. A drain has been removed since prior. Osteopenia and
atherosclerosis.
IMPRESSION: No acute finding.

## 2016-09-08 MED ORDER — SODIUM CHLORIDE 0.9 % IV BOLUS (SEPSIS)
250.0000 mL | Freq: Once | INTRAVENOUS | Status: AC
  Administered 2016-09-08: 250 mL via INTRAVENOUS

## 2016-09-08 NOTE — Anesthesia Postprocedure Evaluation (Signed)
Anesthesia Post Note  Patient: Heather Nielsen  Procedure(s) Performed: Procedure(s) (LRB): LEFT TOTAL HIP ARTHROPLASTY ANTERIOR APPROACH (Left)  Patient location during evaluation: PACU Anesthesia Type: Spinal Level of consciousness: oriented and awake and alert Pain management: pain level controlled Vital Signs Assessment: post-procedure vital signs reviewed and stable Respiratory status: spontaneous breathing, respiratory function stable and patient connected to nasal cannula oxygen Cardiovascular status: blood pressure returned to baseline and stable Postop Assessment: no headache and no backache Anesthetic complications: no    Last Vitals:  Vitals:   09/08/16 0133 09/08/16 0527  BP: (!) 141/59 (!) 131/51  Pulse: 71 68  Resp: 16 14  Temp: 36.6 C 37.2 C    Last Pain:  Vitals:   09/08/16 0618  TempSrc:   PainSc: 0-No pain                 Bonita Quinichard S Antara Brecheisen

## 2016-09-08 NOTE — Progress Notes (Signed)
Physical Therapy Treatment Patient Details Name: Desma Maximrline D Rode MRN: [id number] DOB: 03/22/1934 Today's Date: 09/08/2016    History of Present Illness s/p L DA THA    PT Comments    Good progress with mobility.  Reviewed stairs with pt and dtr.  Follow Up Recommendations  Home health PT     Equipment Recommendations  None recommended by PT    Recommendations for Other Services OT consult     Precautions / Restrictions Precautions Precautions: Fall Precaution Comments: Knee pain decreased with knee brace in place Required Braces or Orthoses: Other Brace/Splint Other Brace/Splint: hinged L knee brace when OOB or WB Restrictions Weight Bearing Restrictions: No Other Position/Activity Restrictions: WBAT    Mobility  Bed Mobility Overal bed mobility: Needs Assistance Bed Mobility: Supine to Sit     Supine to sit: Min guard     General bed mobility comments: cues for sequence and use of R LE to self assist  Transfers Overall transfer level: Needs assistance Equipment used: Rolling walker (2 wheeled) Transfers: Sit to/from Stand Sit to Stand: Min guard         General transfer comment: for safety  Ambulation/Gait Ambulation/Gait assistance: Min guard Ambulation Distance (Feet): 50 Feet Assistive device: Rolling walker (2 wheeled) Gait Pattern/deviations: Step-to pattern;Decreased step length - right;Decreased step length - left;Shuffle;Trunk flexed Gait velocity: decr Gait velocity interpretation: Below normal speed for age/gender General Gait Details: cues for sequence, posture and position from RW.    Stairs Stairs: Yes Stairs assistance: Min assist Stair Management: No rails;Step to pattern;Backwards;With walker Number of Stairs: 4 General stair comments: 2 stairs twice with dtr assisting.  CUes for sequence and foot/RW placement,  Written instructions provided.  Wheelchair Mobility    Modified Rankin (Stroke Patients Only)       Balance                                     Cognition Arousal/Alertness: Awake/alert Behavior During Therapy: WFL for tasks assessed/performed Overall Cognitive Status: Within Functional Limits for tasks assessed                      Exercises      General Comments        Pertinent Vitals/Pain Pain Assessment: 0-10 Pain Score: 4  Pain Location: L knee Pain Descriptors / Indicators: Aching;Sore Pain Intervention(s): Limited activity within patient's tolerance;Monitored during session    Home Living                      Prior Function            PT Goals (current goals can now be found in the care plan section) Acute Rehab PT Goals Patient Stated Goal: home PT Goal Formulation: With patient Time For Goal Achievement: 09/12/16 Potential to Achieve Goals: Good Progress towards PT goals: Progressing toward goals    Frequency    7X/week      PT Plan Current plan remains appropriate    Co-evaluation             End of Session Equipment Utilized During Treatment: Gait belt Activity Tolerance: Patient tolerated treatment well Patient left: in chair;with call bell/phone within reach     Time: [id number] PT Time Calculation (min) (ACUTE ONLY): 37 min  Charges:  $Gait Training: 23-37 mins  G Codes:      Nilan Iddings 09/15/2016, 4:57 PM

## 2016-09-08 NOTE — Progress Notes (Signed)
Physical Therapy Treatment Patient Details Name: Heather Nielsen MRN: [id number] DOB: 11/16/1934 Today's Date: 09/08/2016    History of Present Illness s/p L DA THA    PT Comments    Pt moving slowly and tentatively but with marked improvement in activity tolerance and no c/o dizziness.  Follow Up Recommendations  Home health PT     Equipment Recommendations  None recommended by PT    Recommendations for Other Services OT consult     Precautions / Restrictions Precautions Precautions: Fall Precaution Comments: Knee pain decreased with knee brace in place Required Braces or Orthoses: Other Brace/Splint Other Brace/Splint: hingled L knee brace when OOB or WB Restrictions Weight Bearing Restrictions: No Other Position/Activity Restrictions: WBAT    Mobility  Bed Mobility Overal bed mobility: Needs Assistance Bed Mobility: Supine to Sit     Supine to sit: Min assist     General bed mobility comments: cues for sequence and use of R LE to self assist  Transfers Overall transfer level: Needs assistance Equipment used: Rolling walker (2 wheeled) Transfers: Sit to/from Stand Sit to Stand: Min assist         General transfer comment: cues for LE management and use of UEs to self assist  Ambulation/Gait Ambulation/Gait assistance: Min assist Ambulation Distance (Feet): 37 Feet Assistive device: Rolling walker (2 wheeled) Gait Pattern/deviations: Step-to pattern;Decreased step length - right;Decreased step length - left;Shuffle;Trunk flexed Gait velocity: decr Gait velocity interpretation: Below normal speed for age/gender General Gait Details: cues for sequence, posture and position from RW.  DIstance ltd by fatigue   Stairs            Wheelchair Mobility    Modified Rankin (Stroke Patients Only)       Balance                                    Cognition Arousal/Alertness: Awake/alert Behavior During Therapy: WFL for tasks  assessed/performed Overall Cognitive Status: Within Functional Limits for tasks assessed                      Exercises      General Comments        Pertinent Vitals/Pain Pain Assessment: 0-10 Pain Score: 5  Pain Location: L knee Pain Descriptors / Indicators: Grimacing;Sore Pain Intervention(s): Limited activity within patient's tolerance;Monitored during session (Pt declines pain meds prior to and after tx)    Home Living                      Prior Function            PT Goals (current goals can now be found in the care plan section) Acute Rehab PT Goals Patient Stated Goal: home PT Goal Formulation: With patient Time For Goal Achievement: 09/12/16 Potential to Achieve Goals: Good Progress towards PT goals: Progressing toward goals    Frequency    7X/week      PT Plan Current plan remains appropriate    Co-evaluation             End of Session Equipment Utilized During Treatment: Gait belt Activity Tolerance: Patient limited by fatigue Patient left: in chair;with call bell/phone within reach     Time: 1156-1230 PT Time Calculation (min) (ACUTE ONLY): 34 min  Charges:  $Gait Training: 23-37 mins  G Codes:      Jodi Criscuolo 09-23-2016, 1:25 PM

## 2016-09-08 NOTE — Progress Notes (Signed)
Occupational Therapy Treatment Patient Details Name: Heather Nielsen MRN: [id number] DOB: 06/13/1934 Today's Date: 09/08/2016    History of present illness s/p L DA THA   OT comments  All education completed this session  Follow Up Recommendations  Supervision/Assistance - 24 hour    Equipment Recommendations  None recommended by OT    Recommendations for Other Services      Precautions / Restrictions Precautions Precautions: Fall Precaution Comments: Knee pain decreased with knee brace in place Required Braces or Orthoses: Other Brace/Splint Other Brace/Splint: hingled L knee brace when OOB or WB Restrictions Weight Bearing Restrictions: No Other Position/Activity Restrictions: WBAT       Mobility Bed Mobility Overal bed mobility: Needs Assistance Bed Mobility: Supine to Sit          General bed mobility comments: oob  Transfers Overall transfer level: Needs assistance Equipment used: Rolling walker (2 wheeled) Transfers: Sit to/from Stand Sit to Stand: Min guard         General transfer comment: for safety    Balance                                   ADL                           Toilet Transfer: Min guard;BSC;RW;Ambulation   Toileting- Clothing Manipulation and Hygiene: Supervision/safety;Sit to/from stand   Tub/ Shower Transfer: Walk-in shower;Min guard;Ambulation     General ADL Comments: pt return demonstrated bathroom transfers.  Daughter present      Vision                     Perception     Praxis      Cognition   Behavior During Therapy: WFL for tasks assessed/performed Overall Cognitive Status: Within Functional Limits for tasks assessed                       Extremity/Trunk Assessment               Exercises     Shoulder Instructions       General Comments      Pertinent Vitals/ Pain       Pain Assessment: 0-10 Pain Score: 4  Pain Location: L knee Pain Descriptors  / Indicators: Sore Pain Intervention(s): Limited activity within patient's tolerance;Monitored during session;Repositioned  Home Living                                          Prior Functioning/Environment              Frequency  Min 2X/week        Progress Toward Goals  OT Goals(current goals can now be found in the care plan section)  Progress towards OT goals: Progressing toward goals (goals not met, but no further OT is needed)  Acute Rehab OT Goals Patient Stated Goal: home  Plan      Co-evaluation                 End of Session     Activity Tolerance Patient tolerated treatment well   Patient Left with call bell/phone within reach;in chair;with family/visitor present   Nurse Communication  Time: 3790-2409 OT Time Calculation (min): 28 min  Charges: OT General Charges $OT Visit: 1 Procedure OT Treatments $Self Care/Home Management : 23-37 mins  Heather Nielsen 09/08/2016, 3:47 PM  Heather Nielsen, OTR/L [phone] 09/08/2016

## 2016-09-08 NOTE — Progress Notes (Signed)
Subjective: 2 Days Post-Op Procedure(s) (LRB): LEFT TOTAL HIP ARTHROPLASTY ANTERIOR APPROACH (Left) Patient reports pain as moderate and severe int he left knee yesterday that interfered with therapy.  No pain at rest but pain with weight bearing. Patient seen in rounds with Dr. Lequita HaltAluisio.  Tender on the inside of the knee.  X-rays taken this morning which did not show any fractures in and around the knee area.  Will get the patient an economy hinge brace for the knee and attempt PWB to the right knee.  She can be WBAT but will [facility] at this time due to pain. Will have her work with therapy and if she does better with the brace, then allow home later this afternoon. Patient is ready to go home.  Objective: Vital signs in last 24 hours: Temp:  [97.8 F (36.6 C)-99 F (37.2 C)] 99 F (37.2 C) (09/22 0527) Pulse Rate:  [68-80] 68 (09/22 0527) Resp:  [14-18] 14 (09/22 0527) BP: (131-167)/(51-67) 131/51 (09/22 0527) SpO2:  [96 %-99 %] 97 % (09/22 0527)  Intake/Output from previous day:  Intake/Output Summary (Last 24 hours) at 09/08/16 0856 Last data filed at 09/08/16 0600  Gross per 24 hour  Intake              710 ml  Output             1150 ml  Net             -440 ml    Intake/Output this shift: No intake/output data recorded.  Labs:  Recent Labs  09/07/16 0458 09/08/16 0419  HGB 11.9* 11.6*    Recent Labs  09/07/16 0458 09/08/16 0419  WBC 15.9* 14.1*  RBC 3.90 3.76*  HCT 33.3* 33.2*  PLT 244 251    Recent Labs  09/07/16 0458 09/08/16 0419  NA 131* 137  K 3.7 3.7  CL 99* 107  CO2 24 25  BUN 10 7  CREATININE 0.53 0.45  GLUCOSE 155* 122*  CALCIUM 8.5* 8.5*   No results for input(s): LABPT, INR in the last 72 hours.  EXAM: General - Patient is Alert, Appropriate and Oriented Extremity - Neurovascular intact Sensation intact distally Dorsiflexion/Plantar flexion intact  Tender to palpation on the distal medial  femoral condyle in the are of the VMO.   No pain on PROM of the knee joint No effusion or swelling about the knee.  No ecchymosis about the knee. Incision - clean, dry, no drainage Motor Function - intact, moving foot and toes well on exam.   Assessment/Plan: 2 Days Post-Op Procedure(s) (LRB): LEFT TOTAL HIP ARTHROPLASTY ANTERIOR APPROACH (Left) Procedure(s) (LRB): LEFT TOTAL HIP ARTHROPLASTY ANTERIOR APPROACH (Left) Past Medical History:  Diagnosis Date  . Arthritis   . Complication of anesthesia   . PONV (postoperative nausea and vomiting)   . Urinary incontinence    Active Problems:   OA (osteoarthritis) of hip  Estimated body mass index is 19.02 kg/m as calculated from the following:   Height as of this encounter: 5\' 2"  (1.575 m).   Weight as of this encounter: 47.2 kg (104 lb). Up with therapy Discharge home with home health Diet - Regular diet Follow up - in 2 weeks Activity - WBAT Disposition - Home Condition Upon Discharge - stable at this time. Home if improved with therapy today. D/C Meds - See DC Summary DVT Prophylaxis - Xarelto  Avel Peacerew Perkins, PA-C Orthopaedic Surgery 09/08/2016, 8:56 AM

## 2016-10-17 ENCOUNTER — Encounter: Payer: Self-pay | Admitting: Physical Therapy

## 2016-10-17 ENCOUNTER — Ambulatory Visit: Payer: Medicare Other | Attending: Orthopedic Surgery | Admitting: Physical Therapy

## 2016-10-17 DIAGNOSIS — M25562 Pain in left knee: Secondary | ICD-10-CM | POA: Diagnosis present

## 2016-10-17 DIAGNOSIS — M6281 Muscle weakness (generalized): Secondary | ICD-10-CM | POA: Diagnosis present

## 2016-10-17 DIAGNOSIS — M25662 Stiffness of left knee, not elsewhere classified: Secondary | ICD-10-CM | POA: Diagnosis present

## 2016-10-17 NOTE — Therapy (Signed)
[facility] [address], [address] [place], [place], [address] Phone: [phone]   Fax:  [phone]  Physical Therapy Evaluation  Patient Details  Name: Heather Nielsen MRN: [id number] Date of Birth: 12/25/1933 Referring Provider: Dr. Lequita HaltAluisio  Encounter Date: 10/17/2016      PT End of Session - 10/17/16 1233    Visit Number 1   Number of Visits 10   Authorization Type gcode needed at 10th visit   PT Start Time 0932   PT Stop Time 1018   PT Time Calculation (min) 46 min   Activity Tolerance Patient tolerated treatment well      Past Medical History:  Diagnosis Date  . Arthritis   . Complication of anesthesia   . PONV (postoperative nausea and vomiting)   . Urinary incontinence     Past Surgical History:  Procedure Laterality Date  . KNEE SURGERY  2007   menicus   . ROTATOR CUFF REPAIR Right 2006  . TOTAL HIP ARTHROPLASTY Right 06/16/2015   Procedure: RIGHT TOTAL HIP ARTHROPLASTY ANTERIOR APPROACH;  Surgeon: Ollen GrossFrank [name], MD;  Location: [facility];  Service: Orthopedics;  Laterality: Right;  . TOTAL HIP ARTHROPLASTY Left 09/06/2016   Procedure: LEFT TOTAL HIP ARTHROPLASTY ANTERIOR APPROACH;  Surgeon: Ollen GrossFrank [name], MD;  Location: [facility];  Service: Orthopedics;  Laterality: Left;    There were no vitals filed for this visit.       Subjective Assessment - 10/17/16 0945    Subjective Left anterior approach THA 09/06/16 and left knee OA.  "Something went wrong in the procedure and when I went to stand up, I had shooting sharp pain down my leg.  I've been through hell but I just turned the corner last week."   "My leg was on fire at night for 4 weeks."  Had HHPT for 5 weeks.  I'm 80% better.  Presents with a SPC in community.   Now my left knee hurts b/c of my hip which is new since THA.      Pertinent History states bowel and bladder function is not the same since surgery (not on regular schedule)     Diagnostic tests  x-ray of left knee= OA   Patient Stated Goals Be able to walk in the community without cane    Currently in Pain? Yes   Pain Score 4    Pain Location Knee   Pain Orientation Left   Pain Descriptors / Indicators Discomfort   Pain Type Acute pain   Pain Onset More than a month ago   Pain Frequency Intermittent   Aggravating Factors  walking,  difficulty turning over in bed,     Pain Relieving Factors soft cushion on bony part of back            [facility] - 10/17/16 0001      Assessment   Medical Diagnosis left hip replacement; left knee OA   Referring Provider Dr. Lequita HaltAluisio   Onset Date/Surgical Date 09/06/16   Hand Dominance Right   Next MD Visit Dec   Prior Therapy HHPT for 5 weeks     Precautions   Precautions Anterior Hip   Precaution Comments no restrictions per patient      Restrictions   Weight Bearing Restrictions No     Balance Screen   Has the patient fallen in the past 6 months No   Has the patient had a decrease in activity level because of a fear of falling?  No   Is the patient reluctant to leave their home because of a fear of falling?  No     Home [employer] information [employer] managernvironment   Living Environment Private residence   Living Arrangements Spouse/significant other   Available Help at Discharge --  husband has Alzheimers   Home Access Stairs to enter   Entrance Stairs-Number of Steps 3   Home Layout One level     Prior Function   Level of Independence Independent with basic ADLs  limited in grocery shopping   Leisure travel      Observation/Other Assessments   Focus on Therapeutic Outcomes (FOTO)  69% limitation     Posture/Postural Control   Posture/Postural Control No significant limitations     ROM / Strength   AROM / PROM / Strength AROM;Strength     AROM   AROM Assessment Site Hip;Knee   Right/Left Hip Right;Left   Right/Left Knee Right;Left   Right Knee Flexion 141   Left Knee Flexion 130     Strength   Strength Assessment Site Hip;Knee    Right/Left Hip Right;Left  R knee/hip 4+/5   Left Hip ABduction 3+/5   Right/Left Knee Right;Left   Left Knee Flexion 4/5   Left Knee Extension 4-/5     Palpation   Patella mobility unremarkable     Bed Mobility   Bed Mobility --  only able to roll quarter turn onto L side, needs VC     Transfers   Sit to Stand --  min use of hands   Stand to Sit --  min use of hands     Ambulation/Gait   Assistive device Straight cane  reports she does not use at home     Timed Up and Go Test   Manual TUG (seconds) 28.9  high fall risk                           PT Education - 10/17/16 1231    Education provided Yes   Education Details edu on patient condition and how strengthening will help with increased mobility and decreased knee pain   Person(s) Educated Patient   Methods Explanation   Comprehension Verbalized understanding          PT Short Term Goals - 10/17/16 1158      PT SHORT TERM GOAL #1   Title increased Left hip strength to demonstrate MMT 4/5   Baseline 3+/5   Time 4   Period Weeks   Status New     PT SHORT TERM GOAL #2   Title independent with initial HEP   Time 2   Period Weeks   Status New           PT Long Term Goals - 10/17/16 1201      PT LONG TERM GOAL #1   Title independent with final HEP   Time 8   Period Weeks   Status New     PT LONG TERM GOAL #2   Title able to walk around grocery store not limited by pain   Time 8   Period Weeks   Status New     PT LONG TERM GOAL #3   Title able to independently roll in bed without pain   Time 8   Period Weeks   Status New     PT LONG TERM GOAL #4   Title FOTO score 50% limitation   Time 8   Period Weeks  Status New     PT LONG TERM GOAL #5   Title TUG reduced to <15 sec for reduced fall risk   Time 8   Period Weeks   Status New               Plan - 2016-10-23 1131    Clinical Impression Statement Pt presents to clinic with cheif complaint of Left knee  pain.  Pt demonstrates general muscle weakness with significant weakness of Left hip abductors which may be contributing to some patellafemoral syndrome.  Pt has difficulty with bed mobility and is unable to roll to her side on the plinth. Pt activities such as going to church and grocery shopping are limited due to being unable to walk from where she needs to park without knee pain.  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  She will benefit from skilled PT in order to return to full function to be able to perform all ADLs and social activities for return to healthy lifestyle.  Pt was seen for low level complexity evaluation due to minimal complicatoins and comorbidities.     Rehab Potential Good   PT Frequency 2x / week   PT Duration 8 weeks   PT Treatment/Interventions Manual techniques;Therapeutic exercise;Therapeutic activities;Functional mobility training;Gait training;Moist Heat;Electrical Stimulation;Patient/family education;Neuromuscular re-education;Cryotherapy   PT Next Visit Plan initiate LE strengthening including hip abduction, work on supine to sidelying for improved bed mobility   Consulted and Agree with Plan of Care Patient      Patient will benefit from skilled therapeutic intervention in order to improve the following deficits and impairments:  Abnormal gait, Difficulty walking, Decreased range of motion, Increased muscle spasms, Decreased mobility, Pain, Decreased strength  Visit Diagnosis: Muscle weakness (generalized) - Plan: PT plan of care cert/re-cert  Stiffness of left knee, not elsewhere classified - Plan: PT plan of care cert/re-cert  Acute pain of left knee - Plan: PT plan of care cert/re-cert      G-Codes - 10-23-2016 1210    Functional Assessment Tool Used FOTO and clinical judgement   Functional Limitation Mobility: Walking and moving around   Mobility: Walking and Moving Around Current Status [phone]) At least 60 percent but less than  80 percent impaired, limited or restricted   Mobility: Walking and Moving Around Goal Status [phone]) At least 40 percent but less than 60 percent impaired, limited or restricted       Problem List Patient Active Problem List   Diagnosis Date Noted  . OA (osteoarthritis) of hip 06/16/2015  . Osteoarthritis of right hip 12/03/2014  . Routine general medical examination at a health care facility 12/03/2014    Vincente Poli, PT 23-Oct-2016 1:31 PM  [facility] [address] [place], [place], [address] Phone: [phone]   Fax:  [phone]  Name: CRYSTALL DONALDSON MRN: [id number] Date of Birth: [birth year]

## 2016-10-20 ENCOUNTER — Ambulatory Visit: Payer: Medicare Other | Attending: Orthopedic Surgery

## 2016-10-20 DIAGNOSIS — M25662 Stiffness of left knee, not elsewhere classified: Secondary | ICD-10-CM

## 2016-10-20 DIAGNOSIS — M25562 Pain in left knee: Secondary | ICD-10-CM

## 2016-10-20 DIAGNOSIS — M6281 Muscle weakness (generalized): Secondary | ICD-10-CM

## 2016-10-20 NOTE — Therapy (Signed)
[facility] [address], [address] [place], [place], [address] Phone: [phone]   Fax:  [phone]  Physical Therapy Treatment  Patient Details  Name: Heather Nielsen Hester MRN: [id number] Date of Birth: 05/10/1934 Referring Provider: Dr. Lequita HaltAluisio  Encounter Date: 10/20/2016      PT End of Session - 10/20/16 1030    Visit Number 2   Number of Visits 10   Authorization Type gcode needed at 10th visit   PT Start Time 1020   PT Stop Time 1100   PT Time Calculation (min) 40 min   Activity Tolerance Patient tolerated treatment well      Past Medical History:  Diagnosis Date  . Arthritis   . Complication of anesthesia   . PONV (postoperative nausea and vomiting)   . Urinary incontinence     Past Surgical History:  Procedure Laterality Date  . KNEE SURGERY  2007   menicus   . ROTATOR CUFF REPAIR Right 2006  . TOTAL HIP ARTHROPLASTY Right 06/16/2015   Procedure: RIGHT TOTAL HIP ARTHROPLASTY ANTERIOR APPROACH;  Surgeon: Ollen GrossFrank [name], MD;  Location: [facility];  Service: Orthopedics;  Laterality: Right;  . TOTAL HIP ARTHROPLASTY Left 09/06/2016   Procedure: LEFT TOTAL HIP ARTHROPLASTY ANTERIOR APPROACH;  Surgeon: Ollen GrossFrank [name], MD;  Location: [facility];  Service: Orthopedics;  Laterality: Left;    There were no vitals filed for this visit.      Subjective Assessment - 10/20/16 1026    Subjective Pt. reporting only stiffness today in L hip and knee today pain free.    Patient Stated Goals Be able to walk in the community without cane    Currently in Pain? No/denies   Pain Score 0-No pain   Pain Orientation Left   Pain Descriptors / Indicators Discomfort   Multiple Pain Sites No             [facility] Adult PT Treatment/Exercise - 10/20/16 1045      Exercises   Exercises Knee/Hip     Knee/Hip Exercises: Aerobic   Nustep NuStep: lvl 3, 5 min      Knee/Hip Exercises: Standing   Heel Raises 20 reps;Both;2 seconds   Hip  Abduction 10 reps;AROM;2 sets   Abduction Limitations B UE support on ski pole    Hip Extension AROM;10 reps;2 sets   Extension Limitations B UE support      Knee/Hip Exercises: Supine   Straight Leg Raises 15 reps;2 sets   Straight Leg Raises Limitations L only    Other Supine Knee/Hip Exercises Supine hip abduction alternating both sides x 15 reps   Other Supine Knee/Hip Exercises Hooklying clam shell with red TB x 10 reps            PT Short Term Goals - 10/20/16 1030      PT SHORT TERM GOAL #1   Title increased Left hip strength to demonstrate MMT 4/5   Baseline 3+/5   Time 4   Period Weeks   Status On-going     PT SHORT TERM GOAL #2   Title independent with initial HEP   Time 2   Period Weeks   Status On-going           PT Long Term Goals - 10/20/16 1030      PT LONG TERM GOAL #1   Title independent with final HEP   Time 8   Period Weeks   Status On-going     PT LONG TERM GOAL #  2   Title able to walk around grocery store not limited by pain   Time 8   Period Weeks   Status On-going     PT LONG TERM GOAL #3   Title able to independently roll in bed without pain   Time 8   Period Weeks   Status On-going     PT LONG TERM GOAL #4   Title FOTO score 50% limitation   Time 8   Period Weeks   Status On-going     PT LONG TERM GOAL #5   Title TUG reduced to <15 sec for reduced fall risk   Time 8   Period Weeks   Status On-going               Plan - 10/20/16 1037    Clinical Impression Statement Pt. pain well managed today and tolerated initiation of standing hip abduction, extension well.  B hip abduction still very weak in standing thus low reps performed today.  Pt. instructed to bring L knee brace in to treatment on next visit to discuss if appropriate to wear with PT.  Pt. bed mobility supine <> sidelying improved today thus no significant time spent with instruction.  Pt. will continue to benefit from skilled PT to address B hip weakness  to improve function.    PT Treatment/Interventions Manual techniques;Therapeutic exercise;Therapeutic activities;Functional mobility training;Gait training;Moist Heat;Electrical Stimulation;Patient/family education;Neuromuscular re-education;Cryotherapy   PT Next Visit Plan Initiate LE strengthening including hip abduction, work on supine to sidelying for improved bed mobility      Patient will benefit from skilled therapeutic intervention in order to improve the following deficits and impairments:  Abnormal gait, Difficulty walking, Decreased range of motion, Increased muscle spasms, Decreased mobility, Pain, Decreased strength  Visit Diagnosis: Muscle weakness (generalized)  Stiffness of left knee, not elsewhere classified  Acute pain of left knee     Problem List Patient Active Problem List   Diagnosis Date Noted  . OA (osteoarthritis) of hip 06/16/2015  . Osteoarthritis of right hip 12/03/2014  . Routine general medical examination at a health care facility 12/03/2014    Kermit BaloMicah Shalimar Mcclain, PTA 10/20/16 12:13 PM  [facility] [address], [address] [place], [place], [address] Phone: [phone]   Fax:  [phone]  Name: Heather Nielsen Pulice MRN: [id number] Date of Birth: 01/09/1934

## 2016-10-25 ENCOUNTER — Ambulatory Visit: Payer: Medicare Other

## 2016-10-25 DIAGNOSIS — M25662 Stiffness of left knee, not elsewhere classified: Secondary | ICD-10-CM

## 2016-10-25 DIAGNOSIS — M6281 Muscle weakness (generalized): Secondary | ICD-10-CM

## 2016-10-25 DIAGNOSIS — M25562 Pain in left knee: Secondary | ICD-10-CM

## 2016-10-25 NOTE — Patient Instructions (Addendum)
HIP: Hamstrings - Short Sitting    Rest leg on raised surface. Keep knee straight. Lift chest. Hold _20__ seconds. __3_ reps per set, _3__ sets per day  Copyright  VHI. All rights reserved.  Knee to Chest    Lying supine, bend involved knee to chest.  Hold 20 seconds, repeat 3 ___ times. Repeat with other leg. Do _3__ times per day.  Copyright  VHI. All rights reserved.  [facility] [address], [address] [place], [place] [address] Phone # [phone] Fax [phone]

## 2016-10-25 NOTE — Therapy (Signed)
[facility] [address], [address] [place], [place], [address] Phone: [phone]   Fax:  [phone]  Physical Therapy Treatment  Patient Details  Name: Heather Nielsen MRN: [id number] Date of Birth: 07/14/1934 Referring Provider: Dr. Lequita HaltAluisio  Encounter Date: 10/25/2016      PT End of Session - 10/25/16 1217    Visit Number 3   Number of Visits 10   Authorization Type gcode needed at 10th visit   PT Start Time 1145   PT Stop Time 1226   PT Time Calculation (min) 41 min   Activity Tolerance Patient tolerated treatment well   Behavior During Therapy [facility] for tasks assessed/performed      Past Medical History:  Diagnosis Date  . Arthritis   . Complication of anesthesia   . PONV (postoperative nausea and vomiting)   . Urinary incontinence     Past Surgical History:  Procedure Laterality Date  . KNEE SURGERY  2007   menicus   . ROTATOR CUFF REPAIR Right 2006  . TOTAL HIP ARTHROPLASTY Right 06/16/2015   Procedure: RIGHT TOTAL HIP ARTHROPLASTY ANTERIOR APPROACH;  Surgeon: Ollen GrossFrank [name], MD;  [facility]: [facility];  Service: Orthopedics;  Laterality: Right;  . TOTAL HIP ARTHROPLASTY Left 09/06/2016   Procedure: LEFT TOTAL HIP ARTHROPLASTY ANTERIOR APPROACH;  Surgeon: Ollen GrossFrank [name], MD;  Location: [facility];  Service: Orthopedics;  Laterality: Left;    There were no vitals filed for this visit.      Subjective Assessment - 10/25/16 1152    Subjective Pt thinks that her Lt LE pain is coming from her sciatic nerve.     Diagnostic tests x-ray of left knee= OA   Patient Stated Goals Be able to walk in the community without cane    Currently in Pain? No/denies                         [facility]/Ex[facility] - 10/25/16 0001      Knee/Hip Exercises: Stretches   Active Hamstring Stretch Left;3 reps;20 seconds   Other Knee/Hip Stretches single knee to chest 3x20 seconds  significant limitation in AROM with  this motion     Knee/Hip Exercises: Aerobic   Nustep Level 3 x 5.5 minutes  pain so stopped early     Knee/Hip Exercises: Standing   Heel Raises 20 reps;Both;2 seconds   Hip Abduction Stengthening;Both;2 sets;10 reps   Hip Extension Stengthening;Both;2 sets;10 reps   Rebounder weight shifitng 3 ways 3x1 minute each     Knee/Hip Exercises: Seated   Sit to Sand 2 sets;10 reps;without UE support                PT Education - 10/25/16 1158    Education provided Yes   Education Details verbal review of all HEP issued by home health PT, hamstring and signle knee to chest   Person(s) Educated Patient   Methods Explanation   Comprehension Verbalized understanding          PT Short Term Goals - 10/25/16 1153      PT SHORT TERM GOAL #1   Title increased Left hip strength to demonstrate MMT 4/5   Time 4   Period Weeks   Status On-going     PT SHORT TERM GOAL #2   Title independent with initial HEP   Time 2   Status On-going           PT Long Term Goals -  10/25/16 1153      PT LONG TERM GOAL #1   Title independent with final HEP   Time 8   Period Weeks   Status On-going     PT LONG TERM GOAL #2   Title able to walk around grocery store not limited by pain   Time 8   Period Weeks   Status On-going     PT LONG TERM GOAL #3   Title able to independently roll in bed without pain   Time 8   Period Weeks   Status On-going               Plan - 10/25/16 1159    Clinical Impression Statement Pt with limitation due to Lt LE pain especially at night.  Pt with continued LE weakness and demonstrates uncontrolled descent with stand to sit transition.  Pt demonstrates weakness in LEs with standing activity.  Pt will cotinue to benefit from skilled PT for LE strength, endurance, and flexiblity to improve endurance and reduce pain.     Rehab Potential Good   PT Frequency 2x / week   PT Duration 8 weeks   PT Treatment/Interventions Manual  techniques;Therapeutic exercise;Therapeutic activities;Functional mobility training;Gait training;Moist Heat;Electrical Stimulation;Patient/family education;Neuromuscular re-education;Cryotherapy   PT Next Visit Plan LE strength, endurance, flexibility   Consulted and Agree with Plan of Care Patient      Patient will benefit from skilled therapeutic intervention in order to improve the following deficits and impairments:  Abnormal gait, Difficulty walking, Decreased range of motion, Increased muscle spasms, Decreased mobility, Pain, Decreased strength  Visit Diagnosis: Muscle weakness (generalized)  Stiffness of left knee, not elsewhere classified  Acute pain of left knee     Problem List Patient Active Problem List   Diagnosis Date Noted  . OA (osteoarthritis) of hip 06/16/2015  . Osteoarthritis of right hip 12/03/2014  . Routine general medical examination at a health care facility 12/03/2014     Lorrene ReidKelly Sircharles Holzheimer, PT 10/25/16 12:28 PM  [facility] [address], [address] [place], [place], [address] Phone: [phone]   Fax:  [phone]  Name: Heather Nielsen MRN: [id number] Date of Birth: 05/11/1934

## 2016-10-31 ENCOUNTER — Ambulatory Visit: Payer: Medicare Other | Admitting: Physical Therapy

## 2016-10-31 ENCOUNTER — Encounter: Payer: Self-pay | Admitting: Physical Therapy

## 2016-10-31 DIAGNOSIS — M25662 Stiffness of left knee, not elsewhere classified: Secondary | ICD-10-CM

## 2016-10-31 DIAGNOSIS — M25562 Pain in left knee: Secondary | ICD-10-CM

## 2016-10-31 DIAGNOSIS — M6281 Muscle weakness (generalized): Secondary | ICD-10-CM

## 2016-10-31 NOTE — Therapy (Addendum)
[facility] [address], [address] [place], [place], [address] Phone: [phone]   Fax:  [phone]  Physical Therapy Treatment  Patient Details  Name: Heather Nielsen Massman MRN: [id number] Date of Birth: 08/01/1934 Referring Provider: Dr. Lequita HaltAluisio  Encounter Date: 10/31/2016      PT End of Session - 10/31/16 1154    Visit Number 4   Number of Visits 10   Authorization Type gcode needed at 10th visit   PT Start Time 1148   PT Stop Time 1245   PT Time Calculation (min) 57 min   Activity Tolerance Patient tolerated treatment well   Behavior During Therapy [facility] for tasks assessed/performed      Past Medical History:  Diagnosis Date  . Arthritis   . Complication of anesthesia   . PONV (postoperative nausea and vomiting)   . Urinary incontinence     Past Surgical History:  Procedure Laterality Date  . KNEE SURGERY  2007   menicus   . ROTATOR CUFF REPAIR Right 2006  . TOTAL HIP ARTHROPLASTY Right 06/16/2015   Procedure: RIGHT TOTAL HIP ARTHROPLASTY ANTERIOR APPROACH;  Surgeon: Ollen GrossFrank [name], MD;  [facility];  Service: Orthopedics;  Laterality: Right;  . TOTAL HIP ARTHROPLASTY Left 09/06/2016   Procedure: LEFT TOTAL HIP ARTHROPLASTY ANTERIOR APPROACH;  Surgeon: Ollen GrossFrank [name], MD;  [facility];  Service: Orthopedics;  Laterality: Left;    There were no vitals filed for this visit.      Subjective Assessment - 10/31/16 1152    Subjective Pt reports leg is improving, both pain and funciton.    Pertinent History states bowel and bladder function is not the same since surgery (not on regular schedule)     Diagnostic tests x-ray of left knee= OA   Patient Stated Goals Be able to walk in the community without cane    Currently in Pain? Yes   Pain Score 2    Pain Location --  Anterior Lt hip   Pain Orientation Left   Pain Descriptors / Indicators --  pulling   Pain Type Acute pain   Pain Onset More than a month ago    Pain Frequency Intermittent                         [facility] Adult PT Treatment/Exercise - 10/31/16 0001      Knee/Hip Exercises: Supine   Terminal Knee Extension Left;10 reps;1 set  #4   Hip Adduction Isometric Strengthening;Both;1 set;10 reps  3 second holds   Bridges with Clamshell Strengthening;Both;1 set;20 reps  clamshell without bridge     Knee/Hip Exercises: Prone   Hamstring Curl 10 reps;2 sets  yellow tband; verbal cueing for knee flexion   Hip Extension Strengthening;Both;10 reps;2 sets  tactile cueing for technique     Manual Therapy   Manual Therapy Soft tissue mobilization   Manual therapy comments Anterior Lt hip; pt supine     Estim to Lt hip IFC for 15 minutes set to pt tolerance to decrease pain. Moist heat pack to Lt hip to decrease pain. Addended Dessa PhiKatherine Matthews, PTA 11/02/16 12:38 PM             PT Short Term Goals - 10/31/16 1155      PT SHORT TERM GOAL #1   Title increased Left hip strength to demonstrate MMT 4/5   Baseline 3+/5   Time 4   Period Weeks   Status On-going  PT SHORT TERM GOAL #2   Title independent with initial HEP   Time 2   Period Weeks   Status On-going           PT Long Term Goals - 10/31/16 1156      PT LONG TERM GOAL #1   Title independent with final HEP   Time 8   Period Weeks   Status On-going     PT LONG TERM GOAL #2   Title able to walk around grocery store not limited by pain   Time 8   Period Weeks   Status On-going     PT LONG TERM GOAL #3   Title able to independently roll in bed without pain   Time 8   Period Weeks   Status On-going     PT LONG TERM GOAL #4   Title FOTO score 50% limitation   Time 8   Period Weeks   Status On-going     PT LONG TERM GOAL #5   Title TUG reduced to <15 sec for reduced fall risk   Time 8   Status On-going               Plan - 10/31/16 1322    Clinical Impression Statement Pt presents with anterior Rt hip tightness  and Bil decreased gluteal strength. Pt able to complete all exercise well, some difficulty with hamstring and glute activation. Manual therapy to decrease tightness in Lt hip flexors. Pt responded well. Pt will continue to benefit from skilled therapy for LE strenght and hip stability.    Rehab Potential Good   PT Frequency 2x / week   PT Duration 8 weeks   PT Treatment/Interventions Manual techniques;Therapeutic exercise;Therapeutic activities;Functional mobility training;Gait training;Moist Heat;Electrical Stimulation;Patient/family education;Neuromuscular re-education;Cryotherapy   PT Next Visit Plan LE strength, endurance, flexibility   Consulted and Agree with Plan of Care Patient      Patient will benefit from skilled therapeutic intervention in order to improve the following deficits and impairments:  Abnormal gait, Difficulty walking, Decreased range of motion, Increased muscle spasms, Decreased mobility, Pain, Decreased strength  Visit Diagnosis: Muscle weakness (generalized)  Stiffness of left knee, not elsewhere classified  Acute pain of left knee     Problem List Patient Active Problem List   Diagnosis Date Noted  . OA (osteoarthritis) of hip 06/16/2015  . Osteoarthritis of right hip 12/03/2014  . Routine general medical examination at a health care facility 12/03/2014    Dessa PhiKatherine Matthews PTA 10/31/2016, 1:25 PM  [facility] [address], [address] [place], [place], [address] Phone: [phone]   Fax:  [phone]  Name: Heather Nielsen Tilghman MRN: [id number] Date of Birth: 06/04/1934

## 2016-11-02 ENCOUNTER — Ambulatory Visit: Payer: Medicare Other

## 2016-11-02 DIAGNOSIS — M6281 Muscle weakness (generalized): Secondary | ICD-10-CM | POA: Diagnosis not present

## 2016-11-02 DIAGNOSIS — M25662 Stiffness of left knee, not elsewhere classified: Secondary | ICD-10-CM

## 2016-11-02 DIAGNOSIS — M25562 Pain in left knee: Secondary | ICD-10-CM

## 2016-11-02 NOTE — Therapy (Signed)
[facility] [address], [address] [place], [place], [address] Phone: [phone]   Fax:  [phone]  Physical Therapy Treatment  Patient Details  Name: Heather Nielsen MRN: [id number] Date of Birth: 01/20/1934 Referring Provider: Dr. Lequita Nielsen  Encounter Date: 11/02/2016      PT End of Session - 11/02/16 1228    Visit Number 5   Number of Visits 10   Authorization Type gcode needed at 10th visit   PT Start Time 1147   PT Stop Time 1241   PT Time Calculation (min) 54 min   Activity Tolerance Patient tolerated treatment well   Behavior During Therapy [facility] for tasks assessed/performed      Past Medical History:  Diagnosis Date  . Arthritis   . Complication of anesthesia   . PONV (postoperative nausea and vomiting)   . Urinary incontinence     Past Surgical History:  Procedure Laterality Date  . KNEE SURGERY  2007   menicus   . ROTATOR CUFF REPAIR Right 2006  . TOTAL HIP ARTHROPLASTY Right 06/16/2015   Procedure: RIGHT TOTAL HIP ARTHROPLASTY ANTERIOR APPROACH;  Surgeon: Heather GrossFrank [name], MD;  [facility];  Service: Orthopedics;  Laterality: Right;  . TOTAL HIP ARTHROPLASTY Left 09/06/2016   Procedure: LEFT TOTAL HIP ARTHROPLASTY ANTERIOR APPROACH;  Surgeon: Heather GrossFrank [name], MD;  [facility];  Service: Orthopedics;  Laterality: Left;    There were no vitals filed for this visit.      Subjective Assessment - 11/02/16 1150    Subjective My Lt knee is bothering me.     Pertinent History states bowel and bladder function is not the same since surgery (not on regular schedule)     Diagnostic tests x-ray of left knee= OA   Patient Stated Goals Be able to walk in the community without cane    Currently in Pain? Yes   Pain Score 0-No pain   Pain Location Hip   Pain Orientation Left   Pain Descriptors / Indicators Aching                         [facility] Adult PT Treatment/Exercise - 11/02/16 0001       Knee/Hip Exercises: Stretches   Active Hamstring Stretch Left;3 reps;20 seconds   Other Knee/Hip Stretches single knee to chest 3x20 seconds  significant limitation in AROM with this motion     Knee/Hip Exercises: Aerobic   Stationary Bike Level 0 x 5 minutes  PT present to discuss progression     Knee/Hip Exercises: Standing   Heel Raises 20 reps;Both;2 seconds   Rebounder weight shifitng 3 ways 3x1 minute each     Knee/Hip Exercises: Supine   Bridges with Clamshell Strengthening;Both;1 set;20 reps  clamshell without bridge     Knee/Hip Exercises: Prone   Hamstring Curl 10 reps;2 sets  yellow tband; verbal cueing for knee flexion   Hip Extension Strengthening;Both;10 reps;2 sets  tactile cueing for technique     Modalities   Modalities Electrical Stimulation;Moist Heat     Moist Heat Therapy   Number Minutes Moist Heat 15 Minutes   Moist Heat Location Hip     Electrical Stimulation   Electrical Stimulation Location Lt hip and anterior thigh   Electrical Stimulation Action IFC   Electrical Stimulation Parameters 15 minutes   Electrical Stimulation Goals Pain                PT Education - 11/02/16  1228    Education provided Yes   Education Details scar massage   Person(s) Educated Patient   Methods Explanation   Comprehension Verbalized understanding          PT Short Term Goals - 10/31/16 1155      PT SHORT TERM GOAL #1   Title increased Left hip strength to demonstrate MMT 4/5   Baseline 3+/5   Time 4   Period Weeks   Status On-going     PT SHORT TERM GOAL #2   Title independent with initial HEP   Time 2   Period Weeks   Status On-going           PT Long Term Goals - 10/31/16 1156      PT LONG TERM GOAL #1   Title independent with final HEP   Time 8   Period Weeks   Status On-going     PT LONG TERM GOAL #2   Title able to walk around grocery store not limited by pain   Time 8   Period Weeks   Status On-going     PT LONG  TERM GOAL #3   Title able to independently roll in bed without pain   Time 8   Period Weeks   Status On-going     PT LONG TERM GOAL #4   Title FOTO score 50% limitation   Time 8   Period Weeks   Status On-going     PT LONG TERM GOAL #5   Title TUG reduced to <15 sec for reduced fall risk   Time 8   Status On-going               Plan - 11/02/16 1157    Clinical Impression Statement Pt with Lt anterior hip tightness and decreased hip strength bil.  Pt able to perform all exercises in clinic with some difficulty with gluteal activation.  Pt without pain today.  Pt will continue to benefit from skilled PT for hip strength, gentle flexiblity and manual/modalities as needed.     PT Frequency 2x / week   PT Duration 8 weeks   PT Treatment/Interventions Manual techniques;Therapeutic exercise;Therapeutic activities;Functional mobility training;Gait training;Moist Heat;Electrical Stimulation;Patient/family education;Neuromuscular re-education;Cryotherapy   PT Next Visit Plan LE strength, endurance, flexibility   Consulted and Agree with Plan of Care Patient      Patient will benefit from skilled therapeutic intervention in order to improve the following deficits and impairments:  Abnormal gait, Difficulty walking, Decreased range of motion, Increased muscle spasms, Decreased mobility, Pain, Decreased strength  Visit Diagnosis: Muscle weakness (generalized)  Stiffness of left knee, not elsewhere classified  Acute pain of left knee     Problem List Patient Active Problem List   Diagnosis Date Noted  . OA (osteoarthritis) of hip 06/16/2015  . Osteoarthritis of right hip 12/03/2014  . Routine general medical examination at a health care facility 12/03/2014    Heather Nielsen 11/02/2016, 12:29 PM   [facility] [address], [address] [place], [place], [address] Phone: [phone]   Fax:  [phone]  Name: Heather Nielsen MRN: [id number] Date of Birth: 10/24/1934

## 2016-11-06 ENCOUNTER — Encounter: Payer: Medicare Other | Admitting: Physical Therapy

## 2016-11-14 ENCOUNTER — Encounter: Payer: Self-pay | Admitting: Physical Therapy

## 2016-11-14 ENCOUNTER — Ambulatory Visit: Payer: Medicare Other | Admitting: Physical Therapy

## 2016-11-14 DIAGNOSIS — M25562 Pain in left knee: Secondary | ICD-10-CM

## 2016-11-14 DIAGNOSIS — M6281 Muscle weakness (generalized): Secondary | ICD-10-CM | POA: Diagnosis not present

## 2016-11-14 DIAGNOSIS — M25662 Stiffness of left knee, not elsewhere classified: Secondary | ICD-10-CM

## 2016-11-14 NOTE — Therapy (Signed)
[facility] [address], [address] [place], [place], [address] Phone: [phone]   Fax:  [phone]  Physical Therapy Treatment  Patient Details  Name: Heather Nielsen Messmer MRN: [id number] Date of Birth: 10/31/1934 Referring Provider: Dr. Lequita HaltAluisio  Encounter Date: 11/14/2016      PT End of Session - 11/14/16 1604    Visit Number 6   Number of Visits 10   Authorization Type gcode needed at 10th visit   PT Start Time 1530   PT Stop Time 1608   PT Time Calculation (min) 38 min   Activity Tolerance Patient tolerated treatment well   Behavior During Therapy [facility] for tasks assessed/performed      Past Medical History:  Diagnosis Date  . Arthritis   . Complication of anesthesia   . PONV (postoperative nausea and vomiting)   . Urinary incontinence     Past Surgical History:  Procedure Laterality Date  . KNEE SURGERY  2007   menicus   . ROTATOR CUFF REPAIR Right 2006  . TOTAL HIP ARTHROPLASTY Right 06/16/2015   Procedure: RIGHT TOTAL HIP ARTHROPLASTY ANTERIOR APPROACH;  Surgeon: Ollen GrossFrank [name], MD;  [facility];  Service: Orthopedics;  Laterality: Right;  . TOTAL HIP ARTHROPLASTY Left 09/06/2016   Procedure: LEFT TOTAL HIP ARTHROPLASTY ANTERIOR APPROACH;  Surgeon: Ollen GrossFrank [name], MD;  [facility];  Service: Orthopedics;  Laterality: Left;    There were no vitals filed for this visit.      Subjective Assessment - 11/14/16 1535    Subjective Lt knee hurting today. Lt hip doing well but having some groin pulling occasionall which is new. Sciatica doing better but is still present sometimes.    Pertinent History states bowel and bladder function is not the same since surgery (not on regular schedule)     Diagnostic tests x-ray of left knee= OA   Patient Stated Goals Be able to walk in the community without cane    Currently in Pain? No/denies   Pain Location --                         [facility] Adult PT  Treatment/Exercise - 11/14/16 0001      Knee/Hip Exercises: Stretches   Active Hamstring Stretch Left;3 reps;20 seconds   Other Knee/Hip Stretches single knee to chest 3x20 seconds  significant limitation in AROM with this motion   Other Knee/Hip Stretches Gentle internal external rotation     Knee/Hip Exercises: Aerobic   Stationary Bike Level 0 x 5 minutes  PT present to discuss progression     Knee/Hip Exercises: Standing   Heel Raises 20 reps;Both;2 seconds   Hip Flexion Stengthening;Both;2 sets;10 reps   Hip Abduction Stengthening;Both;2 sets;10 reps   Hip Extension Stengthening;Both;2 sets;10 reps   Forward Step Up 20 reps;Hand Hold: 1;Step Height: 6"   SLS Blue mat 30 seconds x2   Rebounder weight shifitng 3 ways 3x1 minute each     Knee/Hip Exercises: Seated   Sit to Sand 20 reps                  PT Short Term Goals - 11/14/16 1543      PT SHORT TERM GOAL #1   Title increased Left hip strength to demonstrate MMT 4/5   Baseline 3+/5   Time 4   Period Weeks   Status On-going     PT SHORT TERM GOAL #2   Title independent with initial  HEP   Time 2   Period Weeks   Status Achieved           PT Long Term Goals - 11/14/16 1543      PT LONG TERM GOAL #1   Title independent with final HEP   Time 8   Period Weeks   Status On-going     PT LONG TERM GOAL #2   Title able to walk around grocery store not limited by pain   Time 8   Period Weeks   Status On-going     PT LONG TERM GOAL #3   Title able to independently roll in bed without pain   Time 8   Period Weeks   Status On-going     PT LONG TERM GOAL #4   Title FOTO score 50% limitation   Time 8   Period Weeks   Status On-going     PT LONG TERM GOAL #5   Title TUG reduced to <15 sec for reduced fall risk   Time 8   Period Weeks   Status On-going               Plan - 11/14/16 1713    Clinical Impression Statement Pt reports hip not hurting too bad but having some lateral hip  pain with certain movements. Most pain in knee. Pt able to tolerate all exercises well today. Pt will continue to benefit from skilled therapy for LE strengthening and hip stability.    Rehab Potential Good   PT Frequency 2x / week   PT Duration 8 weeks   PT Treatment/Interventions Manual techniques;Therapeutic exercise;Therapeutic activities;Functional mobility training;Gait training;Moist Heat;Electrical Stimulation;Patient/family education;Neuromuscular re-education;Cryotherapy   PT Next Visit Plan LE strength, endurance, flexibility   Consulted and Agree with Plan of Care Patient      Patient will benefit from skilled therapeutic intervention in order to improve the following deficits and impairments:  Abnormal gait, Difficulty walking, Decreased range of motion, Increased muscle spasms, Decreased mobility, Pain, Decreased strength  Visit Diagnosis: Muscle weakness (generalized)  Stiffness of left knee, not elsewhere classified  Acute pain of left knee     Problem List Patient Active Problem List   Diagnosis Date Noted  . OA (osteoarthritis) of hip 06/16/2015  . Osteoarthritis of right hip 12/03/2014  . Routine general medical examination at a health care facility 12/03/2014    Dessa PhiKatherine Matthews PTA 11/14/2016, 5:17 PM  [facility] [address], [address] [place], [place], [address] Phone: [phone]   Fax:  [phone]  Name: Heather Nielsen Staszewski MRN: [id number] Date of Birth: 07/10/1934

## 2016-11-16 ENCOUNTER — Ambulatory Visit: Payer: Medicare Other | Admitting: Physical Therapy

## 2016-11-16 ENCOUNTER — Encounter: Payer: Self-pay | Admitting: Physical Therapy

## 2016-11-16 DIAGNOSIS — M25562 Pain in left knee: Secondary | ICD-10-CM

## 2016-11-16 DIAGNOSIS — M6281 Muscle weakness (generalized): Secondary | ICD-10-CM

## 2016-11-16 DIAGNOSIS — M25662 Stiffness of left knee, not elsewhere classified: Secondary | ICD-10-CM

## 2016-11-16 NOTE — Therapy (Signed)
[facility] [address], [address] [place], [place], [address] Phone: [phone]   Fax:  [phone]  Physical Therapy Treatment  Patient Details  Name: Heather Nielsen MRN: [id number] Date of Birth: 02/14/1934 Referring Provider: Dr. Lequita HaltAluisio  Encounter Date: 11/16/2016      PT End of Session - 11/16/16 1219    Visit Number 7   Number of Visits 10   Authorization Type gcode needed at 10th visit   PT Start Time 1149   PT Stop Time 1229   PT Time Calculation (min) 40 min   Activity Tolerance Patient tolerated treatment well   Behavior [facility] tasks assessed/performed      Past Medical History:  Diagnosis Date  . Arthritis   . Complication of anesthesia   . PONV (postoperative nausea and vomiting)   . Urinary incontinence     Past Surgical History:  Procedure Laterality Date  . KNEE SURGERY  2007   menicus   . ROTATOR CUFF REPAIR Right 2006  . TOTAL HIP ARTHROPLASTY Right 06/16/2015   Procedure: RIGHT TOTAL HIP ARTHROPLASTY ANTERIOR APPROACH;  Surgeon: Ollen GrossFrank [name], MD;  Location: [facility];  Service: Orthopedics;  Laterality: Right;  . TOTAL HIP ARTHROPLASTY Left 09/06/2016   Procedure: LEFT TOTAL HIP ARTHROPLASTY ANTERIOR APPROACH;  Surgeon: Ollen GrossFrank [name], MD;  Location: [facility];  Service: Orthopedics;  Laterality: Left;    There were no vitals filed for this visit.      Subjective Assessment - 11/16/16 1153    Subjective Pt reports feeling sore after session but went swimming and felt better. Reports hip and knee feeling ok, has not done much this morning.   Pertinent History states bowel and bladder function is not the same since surgery (not on regular schedule)     Diagnostic tests x-ray of left knee= OA   Patient Stated Goals Be able to walk in the community without cane    Currently in Pain? No/denies                         [facility] Adult PT Treatment/Exercise - 11/16/16 0001       Knee/Hip Exercises: Stretches   Active Hamstring Stretch Left;3 reps;20 seconds     Knee/Hip Exercises: Aerobic   Stationary Bike Level 0 x 5 minutes  PT present to discuss progression     Knee/Hip Exercises: Standing   Heel Raises 20 reps;Both;2 seconds   Hip Flexion Stengthening;Both;2 sets;10 reps   Hip Abduction Stengthening;Both;2 sets;10 reps   Hip Extension Stengthening;Both;2 sets;10 reps   Forward Step Up 20 reps;Hand Hold: 1;Step Height: 6"   SLS Blue mat 30 seconds x2   Rebounder Marching, weight shifitng 3 ways 3x1 minute each     Knee/Hip Exercises: Seated   Sit to [employer] 20 reps                PT Education - 11/16/16 1311    Education provided No   Education Details --   Teacher, musicerson(s) Educated --   Methods --   Comprehension --          PT Short Term Goals - 11/14/16 1543      PT SHORT TERM GOAL #1   Title increased Left hip strength to demonstrate MMT 4/5   Baseline 3+/5   Time 4   Period Weeks   Status On-going     PT SHORT TERM GOAL #2   Title independent  with initial HEP   Time 2   Period Weeks   Status Achieved           PT Long Term Goals - 11/14/16 1543      PT LONG TERM GOAL #1   Title independent with final HEP   Time 8   Period Weeks   Status On-going     PT LONG TERM GOAL #2   Title able to walk around grocery store not limited by pain   Time 8   Period Weeks   Status On-going     PT LONG TERM GOAL #3   Title able to independently roll in bed without pain   Time 8   Period Weeks   Status On-going     PT LONG TERM GOAL #4   Title FOTO score 50% limitation   Time 8   Period Weeks   Status On-going     PT LONG TERM GOAL #5   Title TUG reduced to <15 sec for reduced fall risk   Time 8   Period Weeks   Status On-going               Plan - 11/16/16 1251    Clinical Impression Statement Pt reports hip and knee feeling ok today. Able to tolerate all strengthening exercises well. Some difficulty with  stairs needing verbal cueing to lift, not push off. Pt will continue to benefit from silled therapy for Bil LE strengthening and stability.    Rehab Potential Good   PT Frequency 2x / week   PT Duration 8 weeks   PT Treatment/Interventions Manual techniques;Therapeutic exercise;Therapeutic activities;Functional mobility training;Gait training;Moist Heat;Electrical Stimulation;Patient/family education;Neuromuscular re-education;Cryotherapy   PT Next Visit Plan Glute activation, endurance, flexibility   Consulted and Agree with Plan of Care Patient      Patient will benefit from skilled therapeutic intervention in order to improve the following deficits and impairments:  Abnormal gait, Difficulty walking, Decreased range of motion, Increased muscle spasms, Decreased mobility, Pain, Decreased strength  Visit Diagnosis: Muscle weakness (generalized)  Stiffness of left knee, not elsewhere classified  Acute pain of left knee     Problem List Patient Active Problem List   Diagnosis Date Noted  . OA (osteoarthritis) of hip 06/16/2015  . Osteoarthritis of right hip 12/03/2014  . Routine general medical examination at a health care facility 12/03/2014    Dessa PhiKatherine Tahliyah Anagnos PTA 11/16/2016, 1:22 PM  [facility] [address], [address] [place], [place], [address] Phone: [phone]   Fax:  [phone]  Name: Heather Nielsen MRN: [id number] Date of Birth: 03/24/1934

## 2016-11-16 NOTE — Patient Instructions (Signed)
Gluteal Sets    Tighten buttocks while pressing pelvis to floor. Hold ____ seconds. Repeat ____ times per set. Do ____ sets per session. Do ____ sessions per day.  http://orth.exer.us/105   Copyright  VHI. All rights reserved.  Buttocks    Lying face down, inhale. Keeping leg straight, exhale while lifting leg just off floor. Hold 1 count. Slowly return to starting position. Repeat ____ times each leg. Do ____ sets per session. Do ____ sessions per day. Do with ____ pound ankle weights.  Copyright  VHI. All rights reserved.  Dessa PhiKatherine Matthews, PTA 11/16/16 1:09 PM  [facility] [address], [address] [place], [place] [address] Phone # [phone] Fax [phone]

## 2016-11-21 ENCOUNTER — Ambulatory Visit: Payer: Medicare Other | Attending: Orthopedic Surgery

## 2016-11-21 DIAGNOSIS — M6281 Muscle weakness (generalized): Secondary | ICD-10-CM | POA: Diagnosis present

## 2016-11-21 DIAGNOSIS — M25562 Pain in left knee: Secondary | ICD-10-CM

## 2016-11-21 DIAGNOSIS — M25662 Stiffness of left knee, not elsewhere classified: Secondary | ICD-10-CM | POA: Diagnosis present

## 2016-11-21 NOTE — Therapy (Signed)
[facility] [address] [place], [place], [address] Phone: [phone]   Fax:  [phone]  Physical Therapy Treatment  Patient Details  Name: Heather Nielsen MRN: [id number] Date of Birth: 07/17/1934 Referring Provider: Dr. Wynelle Link  Encounter Date: 11/21/2016      PT End of Session - 11/21/16 1226    Visit Number 8   PT Start Time 9390   PT Stop Time 1228   PT Time Calculation (min) 41 min   Activity Tolerance Patient tolerated treatment well   Behavior During Therapy [facility] for tasks assessed/performed      Past Medical History:  Diagnosis Date  . Arthritis   . Complication of anesthesia   . PONV (postoperative nausea and vomiting)   . Urinary incontinence     Past Surgical History:  Procedure Laterality Date  . KNEE SURGERY  2007   menicus   . ROTATOR CUFF REPAIR Right 2006  . TOTAL HIP ARTHROPLASTY Right 06/16/2015   Procedure: RIGHT TOTAL HIP ARTHROPLASTY ANTERIOR APPROACH;  Surgeon: Gaynelle Arabian, MD;  Location: [facility];  Service: Orthopedics;  Laterality: Right;  . TOTAL HIP ARTHROPLASTY Left 09/06/2016   Procedure: LEFT TOTAL HIP ARTHROPLASTY ANTERIOR APPROACH;  Surgeon: Gaynelle Arabian, MD;  Location: [facility];  Service: Orthopedics;  Laterality: Left;    There were no vitals filed for this visit.      Subjective Assessment - 11/21/16 1156    Subjective Going to see Dr Wynelle Link today.  I feel like PT has helped me.     Patient Stated Goals Be able to walk in the community without cane    Currently in Pain? No/denies            [facility] PT Assessment - 11/21/16 0001      Assessment   Medical Diagnosis left hip replacement; left knee OA     Prior Function   Level of Independence Independent with basic ADLs  limited in grocery shopping     Observation/Other Assessments   Focus on Therapeutic Outcomes (FOTO)  34% limitation     Strength   Overall Strength Deficits   Strength Assessment Site  Hip;Knee   Right/Left Hip Left;Right   Left Hip Flexion 3+/5   Left Hip Extension 4/5   Left Hip ABduction 4/5     Timed Up and Go Test   Manual TUG (seconds) 10.78                     [facility] Adult PT Treatment/Exercise - 11/21/16 0001      Knee/Hip Exercises: Stretches   Active Hamstring Stretch Left;3 reps;20 seconds     Knee/Hip Exercises: Aerobic   Stationary Bike Level 0 x 6 minutes  PT present to discuss progression     Knee/Hip Exercises: Standing   Heel Raises 20 reps;Both;2 seconds   Hip Flexion Stengthening;Both;10 reps;3 sets   Hip Abduction Stengthening;Both;10 reps;3 sets   Hip Extension Stengthening;Both;10 reps;3 sets     Knee/Hip Exercises: Seated   Sit to Sand 20 reps                PT Education - 11/21/16 1220    Education provided Yes   Education Details hip strength exercises   Person(s) Educated Patient   Methods Explanation;Demonstration;Handout   Comprehension Verbalized understanding;Returned demonstration          PT Short Term Goals - 11/21/16 1207      PT SHORT TERM GOAL #1  Title increased Left hip strength to demonstrate MMT 4/5   Time 4   Period Weeks   Status On-going     PT SHORT TERM GOAL #2   Title independent with initial HEP   Status Achieved           PT Long Term Goals - 12/15/2016 1153      PT LONG TERM GOAL #1   Title independent with final HEP   Time 8   Period Weeks   Status Achieved     PT LONG TERM GOAL #2   Title able to walk around grocery store not limited by pain   Time --   Period --   Status Partially Met     PT LONG TERM GOAL #3   Title able to independently roll in bed without pain   Status Achieved     PT LONG TERM GOAL #4   Title FOTO score 50% limitation   Status Achieved     PT LONG TERM GOAL #5   Title TUG reduced to <15 sec for reduced fall risk   Status Achieved               Plan - 2016/12/15 1212    Clinical Impression Statement Pt reports that  she is feeling better since the start of care.  She reports taht she feeling stronger and walking has improved.  FOTO is improved to 34% limitation (improved from 69% limitation).  Strength is improved in the Lt hip and this is still limited.  Pt reports that she begins to limp when walking long distances due to pain and fatigue.  TUG is improved to 10 seconds.  Pt will see MD today and has requested D/C to HEP today.      PT Next Visit Plan D/C PT to HEP today   Consulted and Agree with Plan of Care Patient      Patient will benefit from skilled therapeutic intervention in order to improve the following deficits and impairments:     Visit Diagnosis: Muscle weakness (generalized)  Stiffness of left knee, not elsewhere classified  Acute pain of left knee       G-Codes - 12-15-16 1221    Functional Assessment Tool Used FOTO: 34% limitation   Functional Limitation Mobility: Walking and moving around   Mobility: Walking and Moving Around Goal Status [phone]) At least 40 percent but less than 60 percent impaired, limited or restricted   Mobility: Walking and Moving Around Discharge Status [phone]) At least 20 percent but less than 40 percent impaired, limited or restricted      Problem List Patient Active Problem List   Diagnosis Date Noted  . OA (osteoarthritis) of hip 06/16/2015  . Osteoarthritis of right hip 12/03/2014  . Routine general medical examination at a health care facility 12/03/2014   PHYSICAL THERAPY DISCHARGE SUMMARY  Visits from Start of Care: 8  Current functional level related to goals / functional outcomes: See above for most current status.     Remaining deficits: Lt hip weakness, fatigue and pain with long periods of walking.  Pt has HEP in place for further gains.     Education / Equipment: HEP Plan: Patient agrees to discharge.  Patient goals were partially met. Patient is being discharged due to being pleased with the current functional level.  ?????          Sigurd Sos, PT 15-Dec-2016 12:31 PM  [facility] [address],  [place], [place], [address] Phone: [phone]   Fax:  [phone]  Name: ISRA LINDY MRN: [id number] Date of Birth: Jan 25, 1934

## 2016-11-21 NOTE — Patient Instructions (Addendum)
Knee High   Holding stable object, raise knee to hip level, then lower knee. Repeat with other knee. Complete __2x10_ repetitions. Do __2__ sessions per day.  ABDUCTION: Standing (Active)   Stand, feet flat. Lift right leg out to side. Use _0__ lbs. Complete _2x_10_ repetitions. Perform __2_ sessions per day.         EXTENSION: Standing (Active)  Stand, both feet flat. Draw right leg behind body as far as possible. Use 0___ lbs. Complete 2x 10 repetitions. Perform __2_ sessions per day.  Copyright  VHI. All rights reserved.   Heel Raises    Stand with support. Tighten pelvic floor and hold. With knees straight, raise heels off ground. Repeat _2x10__ times. Do _2__ times a day.  Copyright  VHI. All rights reserved.  HIP / KNEE: Extension - Sit to Stand    Sitting, lean chest forward, raise hips up from surface. Straighten hips and knees. Weight bear equally on left and right sides. Backs of legs should not push off surface. 2x10___ reps per set, __2_ sets per day, ___ days per week Use assistive device as needed.  Copyright  VHI. All rights reserved.  [facility] [address], [address] [place], [place] [address] Phone # [phone] Fax [phone]

## 2016-11-23 ENCOUNTER — Ambulatory Visit: Payer: Self-pay | Admitting: Physical Therapy

## 2017-01-12 DIAGNOSIS — E7800 Pure hypercholesterolemia, unspecified: Secondary | ICD-10-CM | POA: Diagnosis not present

## 2017-01-12 DIAGNOSIS — R7301 Impaired fasting glucose: Secondary | ICD-10-CM | POA: Diagnosis not present

## 2017-01-12 DIAGNOSIS — Z0000 Encounter for general adult medical examination without abnormal findings: Secondary | ICD-10-CM | POA: Diagnosis not present

## 2017-04-02 DIAGNOSIS — H40013 Open angle with borderline findings, low risk, bilateral: Secondary | ICD-10-CM | POA: Diagnosis not present

## 2017-04-02 DIAGNOSIS — H25013 Cortical age-related cataract, bilateral: Secondary | ICD-10-CM | POA: Diagnosis not present

## 2017-04-02 DIAGNOSIS — H52223 Regular astigmatism, bilateral: Secondary | ICD-10-CM | POA: Diagnosis not present

## 2017-04-02 DIAGNOSIS — H524 Presbyopia: Secondary | ICD-10-CM | POA: Diagnosis not present

## 2017-04-02 DIAGNOSIS — H5213 Myopia, bilateral: Secondary | ICD-10-CM | POA: Diagnosis not present

## 2017-04-02 DIAGNOSIS — H3530 Unspecified macular degeneration: Secondary | ICD-10-CM | POA: Diagnosis not present

## 2017-04-02 DIAGNOSIS — H2513 Age-related nuclear cataract, bilateral: Secondary | ICD-10-CM | POA: Diagnosis not present

## 2017-04-09 DIAGNOSIS — H40011 Open angle with borderline findings, low risk, right eye: Secondary | ICD-10-CM | POA: Diagnosis not present

## 2017-04-09 DIAGNOSIS — H5340 Unspecified visual field defects: Secondary | ICD-10-CM | POA: Diagnosis not present

## 2017-04-09 DIAGNOSIS — H401132 Primary open-angle glaucoma, bilateral, moderate stage: Secondary | ICD-10-CM | POA: Diagnosis not present

## 2017-04-09 DIAGNOSIS — H4010X2 Unspecified open-angle glaucoma, moderate stage: Secondary | ICD-10-CM | POA: Diagnosis not present

## 2017-04-09 DIAGNOSIS — H40012 Open angle with borderline findings, low risk, left eye: Secondary | ICD-10-CM | POA: Diagnosis not present

## 2017-04-18 DIAGNOSIS — Z111 Encounter for screening for respiratory tuberculosis: Secondary | ICD-10-CM | POA: Diagnosis not present

## 2017-04-27 DIAGNOSIS — Z111 Encounter for screening for respiratory tuberculosis: Secondary | ICD-10-CM | POA: Diagnosis not present

## 2017-04-30 DIAGNOSIS — H401131 Primary open-angle glaucoma, bilateral, mild stage: Secondary | ICD-10-CM | POA: Diagnosis not present

## 2017-05-22 DIAGNOSIS — H2513 Age-related nuclear cataract, bilateral: Secondary | ICD-10-CM | POA: Diagnosis not present

## 2017-12-05 DIAGNOSIS — H40003 Preglaucoma, unspecified, bilateral: Secondary | ICD-10-CM | POA: Diagnosis not present

## 2018-03-04 DIAGNOSIS — H40003 Preglaucoma, unspecified, bilateral: Secondary | ICD-10-CM | POA: Diagnosis not present

## 2018-03-29 DIAGNOSIS — M1711 Unilateral primary osteoarthritis, right knee: Secondary | ICD-10-CM | POA: Diagnosis not present

## 2018-03-29 DIAGNOSIS — M25562 Pain in left knee: Secondary | ICD-10-CM | POA: Diagnosis not present

## 2018-03-29 DIAGNOSIS — M25561 Pain in right knee: Secondary | ICD-10-CM | POA: Diagnosis not present

## 2018-03-29 DIAGNOSIS — M1712 Unilateral primary osteoarthritis, left knee: Secondary | ICD-10-CM | POA: Diagnosis not present

## 2018-09-03 DIAGNOSIS — H2513 Age-related nuclear cataract, bilateral: Secondary | ICD-10-CM | POA: Diagnosis not present

## 2018-09-25 DIAGNOSIS — M13862 Other specified arthritis, left knee: Secondary | ICD-10-CM | POA: Diagnosis not present

## 2018-09-25 DIAGNOSIS — M13861 Other specified arthritis, right knee: Secondary | ICD-10-CM | POA: Diagnosis not present

## 2019-03-07 DIAGNOSIS — M25561 Pain in right knee: Secondary | ICD-10-CM | POA: Diagnosis not present

## 2019-03-07 DIAGNOSIS — M25562 Pain in left knee: Secondary | ICD-10-CM | POA: Diagnosis not present

## 2019-07-10 DIAGNOSIS — H2513 Age-related nuclear cataract, bilateral: Secondary | ICD-10-CM | POA: Diagnosis not present

## 2019-07-31 DIAGNOSIS — M25562 Pain in left knee: Secondary | ICD-10-CM | POA: Diagnosis not present

## 2019-11-06 DIAGNOSIS — H401132 Primary open-angle glaucoma, bilateral, moderate stage: Secondary | ICD-10-CM | POA: Diagnosis not present

## 2020-01-07 DIAGNOSIS — M1712 Unilateral primary osteoarthritis, left knee: Secondary | ICD-10-CM | POA: Diagnosis not present

## 2020-01-15 ENCOUNTER — Ambulatory Visit: Payer: Medicare Other

## 2020-01-23 ENCOUNTER — Ambulatory Visit: Payer: PPO | Attending: Internal Medicine

## 2020-01-23 DIAGNOSIS — Z23 Encounter for immunization: Secondary | ICD-10-CM | POA: Insufficient documentation

## 2020-01-23 NOTE — Progress Notes (Signed)
   Covid-19 Vaccination Clinic  Name:  Heather Nielsen    MRN: [id number] DOB: [birth year]  01/23/2020  [name] was observed post Covid-19 immunization for 15 minutes without incidence. She was provided with Vaccine Information Sheet and instruction to access the V-Safe system.   [name] was instructed to call 911 with any severe reactions post vaccine: Marland Kitchen Difficulty breathing  . Swelling of your face and throat  . A fast heartbeat  . A bad rash all over your body  . Dizziness and weakness    Immunizations Administered    Name Date Dose VIS Date Route   Pfizer COVID-19 Vaccine 01/23/2020  4:20 PM 0.3 mL 11/28/2019 Intramuscular   Manufacturer: [employer], [employer]   Lot: [id number]   NDC: [address]-3

## 2020-02-18 ENCOUNTER — Ambulatory Visit: Payer: Medicare Other | Attending: Internal Medicine

## 2020-02-18 DIAGNOSIS — Z23 Encounter for immunization: Secondary | ICD-10-CM | POA: Insufficient documentation

## 2020-02-18 NOTE — Progress Notes (Signed)
   Covid-19 Vaccination Clinic  Name:  Heather Nielsen    MRN: [id number] DOB: Nov 10, 1934  02/18/2020  [name] was observed post Covid-19 immunization for 15 minutes without incident. She was provided with Vaccine Information Sheet and instruction to access the V-Safe system.   [name] was instructed to call 911 with any severe reactions post vaccine: Marland Kitchen Difficulty breathing  . Swelling of face and throat  . A fast heartbeat  . A bad rash all over body  . Dizziness and weakness   Immunizations Administered    Name Date Dose VIS Date Route   Pfizer COVID-19 Vaccine 02/18/2020  3:53 PM 0.3 mL 11/28/2019 Intramuscular   Manufacturer: [employer], [employer]   Lot: [id number]   NDC: [address]-6

## 2020-03-03 ENCOUNTER — Other Ambulatory Visit: Payer: Self-pay

## 2020-03-03 ENCOUNTER — Inpatient Hospital Stay (HOSPITAL_COMMUNITY): Payer: PPO

## 2020-03-03 ENCOUNTER — Encounter (HOSPITAL_COMMUNITY): Payer: Self-pay

## 2020-03-03 ENCOUNTER — Inpatient Hospital Stay (HOSPITAL_COMMUNITY)
Admission: EM | Admit: 2020-03-03 | Discharge: 2020-03-06 | DRG: 041 | Disposition: A | Discharge status: Home Health | Payer: PPO | Attending: Internal Medicine | Admitting: Internal Medicine

## 2020-03-03 ENCOUNTER — Emergency Department (HOSPITAL_COMMUNITY): Payer: PPO

## 2020-03-03 DIAGNOSIS — R29810 Facial weakness: Secondary | ICD-10-CM | POA: Diagnosis present

## 2020-03-03 DIAGNOSIS — I10 Essential (primary) hypertension: Secondary | ICD-10-CM | POA: Diagnosis not present

## 2020-03-03 DIAGNOSIS — R531 Weakness: Secondary | ICD-10-CM | POA: Diagnosis not present

## 2020-03-03 DIAGNOSIS — Z66 Do not resuscitate: Secondary | ICD-10-CM | POA: Diagnosis not present

## 2020-03-03 DIAGNOSIS — E785 Hyperlipidemia, unspecified: Secondary | ICD-10-CM | POA: Diagnosis not present

## 2020-03-03 DIAGNOSIS — I6621 Occlusion and stenosis of right posterior cerebral artery: Secondary | ICD-10-CM | POA: Diagnosis not present

## 2020-03-03 DIAGNOSIS — G8191 Hemiplegia, unspecified affecting right dominant side: Secondary | ICD-10-CM | POA: Diagnosis present

## 2020-03-03 DIAGNOSIS — I351 Nonrheumatic aortic (valve) insufficiency: Secondary | ICD-10-CM | POA: Diagnosis not present

## 2020-03-03 DIAGNOSIS — E782 Mixed hyperlipidemia: Secondary | ICD-10-CM | POA: Diagnosis not present

## 2020-03-03 DIAGNOSIS — I63412 Cerebral infarction due to embolism of left middle cerebral artery: Principal | ICD-10-CM | POA: Diagnosis present

## 2020-03-03 DIAGNOSIS — I63512 Cerebral infarction due to unspecified occlusion or stenosis of left middle cerebral artery: Secondary | ICD-10-CM | POA: Diagnosis not present

## 2020-03-03 DIAGNOSIS — I639 Cerebral infarction, unspecified: Secondary | ICD-10-CM | POA: Diagnosis not present

## 2020-03-03 DIAGNOSIS — R404 Transient alteration of awareness: Secondary | ICD-10-CM | POA: Diagnosis not present

## 2020-03-03 DIAGNOSIS — Z79899 Other long term (current) drug therapy: Secondary | ICD-10-CM | POA: Diagnosis not present

## 2020-03-03 DIAGNOSIS — R4701 Aphasia: Secondary | ICD-10-CM | POA: Diagnosis present

## 2020-03-03 DIAGNOSIS — Z87891 Personal history of nicotine dependence: Secondary | ICD-10-CM | POA: Diagnosis not present

## 2020-03-03 DIAGNOSIS — Z96643 Presence of artificial hip joint, bilateral: Secondary | ICD-10-CM | POA: Diagnosis present

## 2020-03-03 DIAGNOSIS — R29703 NIHSS score 3: Secondary | ICD-10-CM | POA: Diagnosis not present

## 2020-03-03 DIAGNOSIS — Z20822 Contact with and (suspected) exposure to covid-19: Secondary | ICD-10-CM | POA: Diagnosis not present

## 2020-03-03 DIAGNOSIS — G459 Transient cerebral ischemic attack, unspecified: Secondary | ICD-10-CM | POA: Diagnosis not present

## 2020-03-03 DIAGNOSIS — I6602 Occlusion and stenosis of left middle cerebral artery: Secondary | ICD-10-CM | POA: Diagnosis not present

## 2020-03-03 DIAGNOSIS — F1010 Alcohol abuse, uncomplicated: Secondary | ICD-10-CM

## 2020-03-03 DIAGNOSIS — Z03818 Encounter for observation for suspected exposure to other biological agents ruled out: Secondary | ICD-10-CM | POA: Diagnosis not present

## 2020-03-03 DIAGNOSIS — R29818 Other symptoms and signs involving the nervous system: Secondary | ICD-10-CM | POA: Diagnosis not present

## 2020-03-03 DIAGNOSIS — I340 Nonrheumatic mitral (valve) insufficiency: Secondary | ICD-10-CM | POA: Diagnosis not present

## 2020-03-03 DIAGNOSIS — Z01818 Encounter for other preprocedural examination: Secondary | ICD-10-CM | POA: Diagnosis not present

## 2020-03-03 DIAGNOSIS — W19XXXA Unspecified fall, initial encounter: Secondary | ICD-10-CM

## 2020-03-03 DIAGNOSIS — R109 Unspecified abdominal pain: Secondary | ICD-10-CM

## 2020-03-03 DIAGNOSIS — R4702 Dysphasia: Secondary | ICD-10-CM | POA: Diagnosis not present

## 2020-03-03 LAB — COMPREHENSIVE METABOLIC PANEL
ALT: 17 U/L (ref 0–44)
AST: 39 U/L (ref 15–41)
Albumin: 4 g/dL (ref 3.5–5.0)
Alkaline Phosphatase: 47 U/L (ref 38–126)
Anion gap: 16 — ABNORMAL HIGH (ref 5–15)
BUN: 15 mg/dL (ref 8–23)
CO2: 22 mmol/L (ref 22–32)
Calcium: 9.5 mg/dL (ref 8.9–10.3)
Chloride: 97 mmol/L — ABNORMAL LOW (ref 98–111)
Creatinine, Ser: 0.76 mg/dL (ref 0.44–1.00)
GFR calc Af Amer: >60 mL/min (ref >60)
GFR calc non Af Amer: >60 mL/min (ref >60)
Glucose, Bld: 122 mg/dL — ABNORMAL HIGH (ref 70–99)
Potassium: 3.6 mmol/L (ref 3.5–5.1)
Sodium: 135 mmol/L (ref 135–145)
Total Bilirubin: 0.7 mg/dL (ref 0.3–1.2)
Total Protein: 6.8 g/dL (ref 6.5–8.1)

## 2020-03-03 LAB — I-STAT CHEM 8, ED
BUN: 17 mg/dL (ref 8–23)
Calcium, Ion: 1.16 mmol/L (ref 1.15–1.40)
Chloride: 99 mmol/L (ref 98–111)
Creatinine, Ser: 0.5 mg/dL (ref 0.44–1.00)
Glucose, Bld: 112 mg/dL — ABNORMAL HIGH (ref 70–99)
HCT: 36 % (ref 36.0–46.0)
Hemoglobin: 12.2 g/dL (ref 12.0–15.0)
Potassium: 3.6 mmol/L (ref 3.5–5.1)
Sodium: 134 mmol/L — ABNORMAL LOW (ref 135–145)
TCO2: 27 mmol/L (ref 22–32)

## 2020-03-03 LAB — DIFFERENTIAL
Abs Immature Granulocytes: 0.05 10*3/uL (ref 0.00–0.07)
Basophils Absolute: 0 10*3/uL (ref 0.0–0.1)
Basophils Relative: 0 %
Eosinophils Absolute: 0 10*3/uL (ref 0.0–0.5)
Eosinophils Relative: 0 %
Immature Granulocytes: 1 %
Lymphocytes Relative: 5 %
Lymphs Abs: 0.5 10*3/uL — ABNORMAL LOW (ref 0.7–4.0)
Monocytes Absolute: 0.9 10*3/uL (ref 0.1–1.0)
Monocytes Relative: 8 %
Neutro Abs: 9.2 10*3/uL — ABNORMAL HIGH (ref 1.7–7.7)
Neutrophils Relative %: 86 %

## 2020-03-03 LAB — CBC
HCT: 38.7 % (ref 36.0–46.0)
Hemoglobin: 13 g/dL (ref 12.0–15.0)
MCH: 31.2 pg (ref 26.0–34.0)
MCHC: 33.6 g/dL (ref 30.0–36.0)
MCV: 92.8 fL (ref 80.0–100.0)
Platelets: 262 10*3/uL (ref 150–400)
RBC: 4.17 MIL/uL (ref 3.87–5.11)
RDW: 12.6 % (ref 11.5–15.5)
WBC: 10.7 10*3/uL — ABNORMAL HIGH (ref 4.0–10.5)
nRBC: 0 % (ref 0.0–0.2)

## 2020-03-03 LAB — PROTIME-INR
INR: 1 (ref 0.8–1.2)
Prothrombin Time: 12.9 seconds (ref 11.4–15.2)

## 2020-03-03 LAB — ETHANOL: Alcohol, Ethyl (B): <10 mg/dL (ref <10)

## 2020-03-03 LAB — RESPIRATORY PANEL BY RT PCR (FLU A&B, COVID)
Influenza A by PCR: NEGATIVE
Influenza B by PCR: NEGATIVE
SARS Coronavirus 2 by RT PCR: NEGATIVE

## 2020-03-03 LAB — APTT: aPTT: 26 seconds (ref 24–36)

## 2020-03-03 IMAGING — CT CT ANGIO NECK
2 of 11 series · 6 of 34 positions shown · IV contrast (APPLIED)
Comparison: None.

CLINICAL DATA: Sudden onset aphasia and right-sided facial droop.
Right-sided weakness.

EXAM:
CT ANGIOGRAPHY HEAD AND NECK
TECHNIQUE: Multidetector CT imaging of the head and neck was performed using
the standard protocol during bolus administration of intravenous
contrast. Multiplanar CT image reconstructions and MIPs were
obtained to evaluate the vascular anatomy. Carotid stenosis
measurements (when applicable) are obtained utilizing NASCET
criteria, using the distal internal carotid diameter as the
denominator.
CONTRAST:  100mL OMNIPAQUE IOHEXOL 350 MG/ML SOLN

[Series 8: sag soft · sagittal · 0.33mm/px · 1 of 60 slices shown]
[im 14/60  soft-tissue]
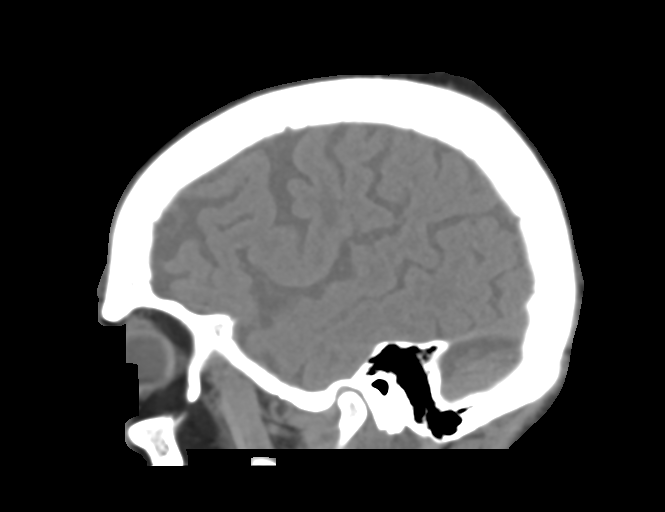

[Series 11: ax thins · axial · 0.39mm/px · z∈[+1118,+1346]mm · 5 of 342 slices shown]
[im 57/342  soft-tissue]
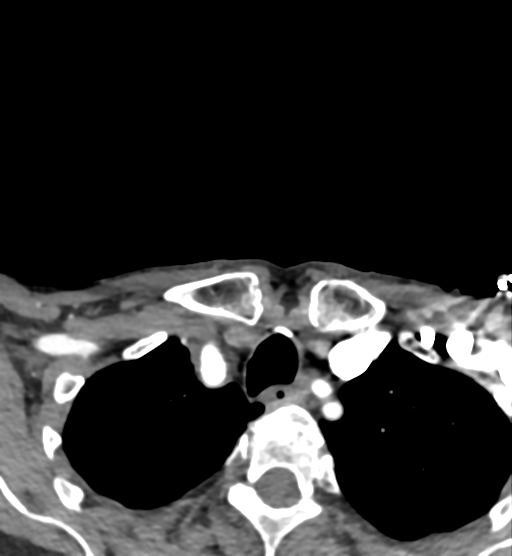
[im 114/342  bone]
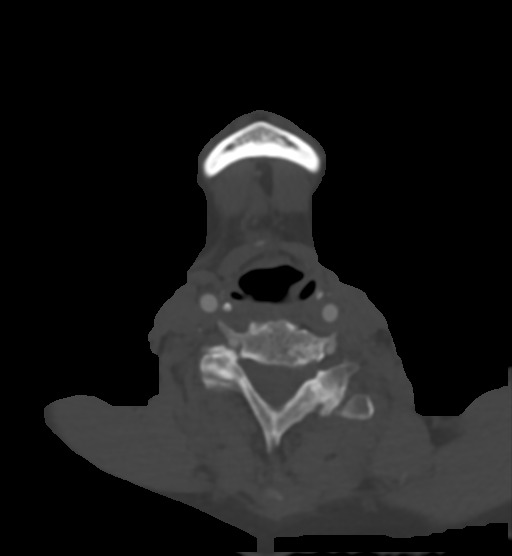
[im 171/342  soft-tissue]
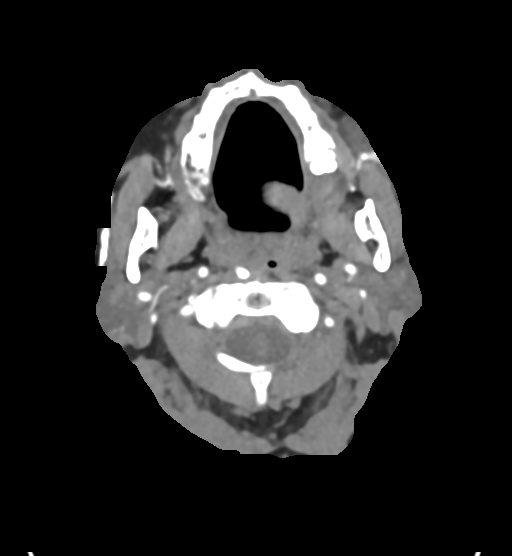
[im 228/342  bone]
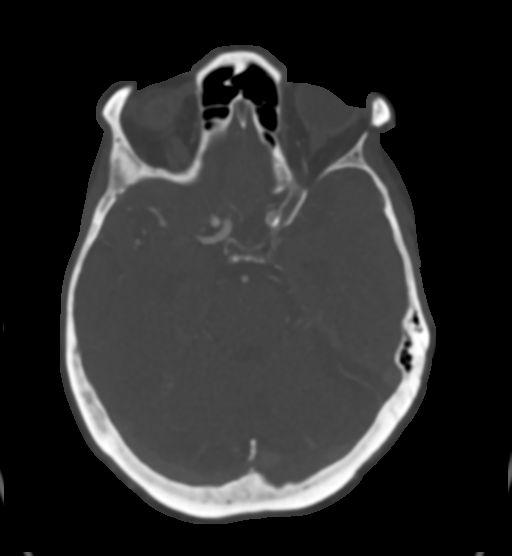
[im 285/342  soft-tissue]
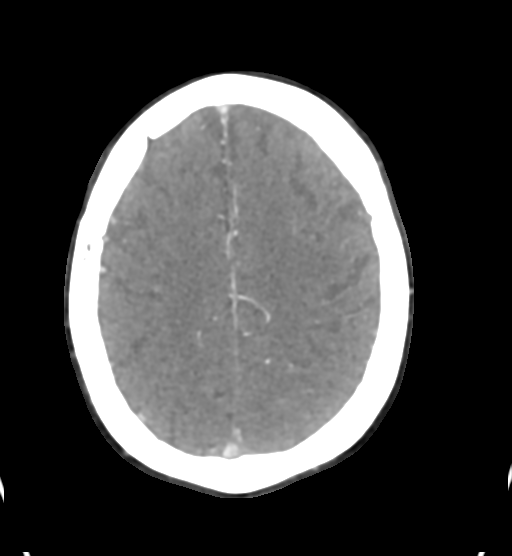

[6 of 34 positions shown; findings below may reference images not displayed]

FINDINGS: CT HEAD FINDINGS

Brain: There is no mass, hemorrhage or extra-axial collection. The
size and configuration of the ventricles and extra-axial CSF spaces
are normal. There is hypoattenuation within the left basal ganglia
and left frontal white matter. There is hypoattenuation of the
periventricular white matter, most commonly indicating chronic
ischemic microangiopathy.

Skull: The visualized skull base, calvarium and extracranial soft
tissues are normal.

Sinuses/Orbits: No fluid levels or advanced mucosal thickening of
the visualized paranasal sinuses. No mastoid or middle ear effusion.
The orbits are normal.

ASPECTS (Alberta Stroke Program Early CT Score)

- Ganglionic level infarction (caudate, lentiform nuclei, internal
capsule, insula, M1-M3 cortex): 4

- Supraganglionic infarction (M4-M6 cortex): 2

Total score (0-10 with 10 being normal): 6

Review of the MIP images confirms the above findings

CTA NECK FINDINGS

SKELETON: There is no bony spinal canal stenosis. No lytic or
blastic lesion.

OTHER NECK: Normal pharynx, larynx and major salivary glands. No
cervical lymphadenopathy. Unremarkable thyroid gland.

UPPER CHEST: No pneumothorax or pleural effusion. No nodules or
masses.

AORTIC ARCH:

There is mild calcific atherosclerosis of the aortic arch. There is
no aneurysm, dissection or hemodynamically significant stenosis of
the visualized portion of the aorta. Conventional 3 vessel aortic
branching pattern. The visualized proximal subclavian arteries are
widely patent.

RIGHT CAROTID SYSTEM: Normal without aneurysm, dissection or
stenosis.

LEFT CAROTID SYSTEM: No dissection, occlusion or aneurysm. There is
mixed density atherosclerosis extending into the proximal ICA,
resulting in less than 50% stenosis.

VERTEBRAL ARTERIES: Left dominant configuration. Mild narrowing of
both vertebral artery origins. There is no dissection, occlusion or
flow-limiting stenosis to the skull base (V1-V3 segments).

CTA HEAD FINDINGS

POSTERIOR CIRCULATION:

--Vertebral arteries: Normal V4 segments.

--Posterior inferior cerebellar arteries (PICA): Patent origins from
the vertebral arteries.

--Anterior inferior cerebellar arteries (AICA): Patent origins from
the basilar artery.

--Basilar artery: Normal.

--Superior cerebellar arteries: Normal.

--Posterior cerebral arteries: Moderate stenosis of the P2 segment
of the right PCA. Fetal origin of the left.

ANTERIOR CIRCULATION:

--Intracranial internal carotid arteries: Atherosclerotic
calcification of the internal carotid arteries at the skull base
without hemodynamically significant stenosis.

--Anterior cerebral arteries (ACA): Normal. Both A1 segments are
present. Patent anterior communicating artery (a-comm).

--Middle cerebral arteries (MCA): The left middle cerebral artery is
occluded at its origin. The right MCA is widely patent.

VENOUS SINUSES: As permitted by contrast timing, patent.

ANATOMIC VARIANTS: Fetal origin of the left posterior cerebral
artery.

Review of the MIP images confirms the above findings.
IMPRESSION: 1. Occlusion of the left middle cerebral artery at its origin.
2. Moderate stenosis of the P2 segment of the right posterior
cerebral artery.
3. Hypoattenuation within the anterior left MCA territory and left
basal ganglia, consistent with early subacute infarct. ASPECTS is 6.
4. No hemorrhage or mass effect.

Critical Value/emergent results were called by telephone at the time
of interpretation on [DATE] at [DATE] to provider CEEJAY ,
who verbally acknowledged these results.

## 2020-03-03 MED ORDER — IOHEXOL 350 MG/ML SOLN
100.0000 mL | Freq: Once | INTRAVENOUS | Status: AC | PRN
  Administered 2020-03-03: 100 mL via INTRAVENOUS

## 2020-03-03 MED ORDER — IOHEXOL 350 MG/ML SOLN
40.0000 mL | Freq: Once | INTRAVENOUS | Status: AC | PRN
  Administered 2020-03-03: 40 mL via INTRAVENOUS

## 2020-03-03 NOTE — ED Triage Notes (Signed)
Pt bib [facility] from home w/ c/o stroke-like symptoms. EMS called for sudden onset of aphasia, R sided facial droop, and R sided weakness. LKW 1200 on 03/02/20, therefore pt outside of code stroke window. EMS VSS, CBG 129.

## 2020-03-03 NOTE — ED Provider Notes (Signed)
[facility] Emergency Department Provider Note MRN:  [id number]  Arrival date & time: 03/03/20     Chief Complaint   Stroke Symptoms   History of Present Illness   Heather Nielsen is a 86 y.o. year-old female with a history of arthritis presenting to the ED with chief complaint of stroke symptoms.  Patient was last seen normal yesterday at noon.  Patient was discovered today not acting normally with a bruise on her head.  Suspected unwitnessed fall.  Patient is unable to form coherent sentences.  Family denies any recent complaints, no cough, no fever.  I was unable to obtain an accurate HPI, PMH, or ROS due to the patient's aphasia.  Level 5 caveat.  Review of Systems  Positive for fall, aphasia. Patient's Health History    Past Medical History:  Diagnosis Date  . Arthritis   . Complication of anesthesia   . PONV (postoperative nausea and vomiting)   . Urinary incontinence     Past Surgical History:  Procedure Laterality Date  . KNEE SURGERY  2007   menicus   . ROTATOR CUFF REPAIR Right 2006  . TOTAL HIP ARTHROPLASTY Right 06/16/2015   Procedure: RIGHT TOTAL HIP ARTHROPLASTY ANTERIOR APPROACH;  Surgeon: Ollen Gross, MD;  Location: [facility];  Service: Orthopedics;  Laterality: Right;  . TOTAL HIP ARTHROPLASTY Left 09/06/2016   Procedure: LEFT TOTAL HIP ARTHROPLASTY ANTERIOR APPROACH;  Surgeon: Ollen Gross, MD;  Location: [facility];  Service: Orthopedics;  Laterality: Left;    Family History  Problem Relation Age of Onset  . Heart disease Mother        died age 59  . CAD Brother 39    Social History   Socioeconomic History  . Marital status: Widowed    Spouse name: Not on file  . Number of children: 5  . Years of education: Not on file  . Highest education level: Not on file  Occupational History  . Not on file  Tobacco Use  . Smoking status: Former Smoker    Packs/day: 0.25    Years: 25.00    Pack years: 6.25    Types: Cigarettes   Quit date: 12/18/1984    Years since quitting: 35.2  . Smokeless tobacco: Never Used  Substance and Sexual Activity  . Alcohol use: Yes    Alcohol/week: 0.0 standard drinks    Comment: gin and tonic and glass of wine daily   . Drug use: No  . Sexual activity: Not on file  Other Topics Concern  . Not on file  Social History Narrative   Lives with husband who has a memory disorder.    Social Determinants of Health   Financial Resource Strain:   . Difficulty of Paying Living Expenses:   Food Insecurity:   . Worried About Programme researcher, broadcasting/film/video in the Last Year:   . Barista in the Last Year:   Transportation Needs:   . Freight forwarder (Medical):   Marland Kitchen Lack of Transportation (Non-Medical):   Physical Activity:   . Days of Exercise per Week:   . Minutes of Exercise per Session:   Stress:   . Feeling of Stress :   Social Connections:   . Frequency of Communication with Friends and Family:   . Frequency of Social Gatherings with Friends and Family:   . Attends Religious Services:   . Active Member of Clubs or Organizations:   . Attends Banker Meetings:   .  Marital Status:   Intimate Partner Violence:   . Fear of Current or Ex-Partner:   . Emotionally Abused:   Marland Kitchen Physically Abused:   . Sexually Abused:      Physical Exam   Vitals:   03/03/20 2230 03/03/20 2315  BP: (!) 155/63 (!) 152/58  Pulse: 63 64  Resp: 15 14  Temp:    SpO2: 97% 97%    CONSTITUTIONAL: Well-appearing, NAD NEURO: Normal and symmetric strength and sensation, expressive aphasia, able to repeat phrases, follow commands, and name objects but unable to form her own sentences EYES:  eyes equal and reactive ENT/NECK:  no LAD, no JVD CARDIO: Regular rate, well-perfused, normal S1 and S2 PULM:  CTAB no wheezing or rhonchi GI/GU:  normal bowel sounds, non-distended, non-tender MSK/SPINE:  No gross deformities, no edema SKIN: Hematoma to the right forehead PSYCH: Unable to assess due  to aphasia  *Additional and/or pertinent findings included in MDM below  Diagnostic and Interventional Summary    EKG Interpretation  Date/Time:  Wednesday March 03 2020 17:40:54 EDT Ventricular Rate:  74 PR Interval:    QRS Duration: 91 QT Interval:  401 QTC Calculation: 445 R Axis:   56 Text Interpretation: Sinus rhythm No previous ECGs available Confirmed by Gerlene Fee [phone]) on 03/03/2020 6:06:02 PM      Labs Reviewed  CBC - Abnormal; Notable for the following components:      Result Value   WBC 10.7 (*)    All other components within normal limits  DIFFERENTIAL - Abnormal; Notable for the following components:   Neutro Abs 9.2 (*)    Lymphs Abs 0.5 (*)    All other components within normal limits  COMPREHENSIVE METABOLIC PANEL - Abnormal; Notable for the following components:   Chloride 97 (*)    Glucose, Bld 122 (*)    Anion gap 16 (*)    All other components within normal limits  I-STAT CHEM 8, ED - Abnormal; Notable for the following components:   Sodium 134 (*)    Glucose, Bld 112 (*)    All other components within normal limits  RESPIRATORY PANEL BY RT PCR (FLU A&B, COVID)  ETHANOL  PROTIME-INR  APTT  RAPID URINE DRUG SCREEN, HOSP PERFORMED  URINALYSIS, ROUTINE W REFLEX MICROSCOPIC    CT Angio Head W or Wo Contrast  Final Result    CT Angio Neck W and/or Wo Contrast  Final Result    CT CEREBRAL PERFUSION W CONTRAST    (Results Pending)    Medications  iohexol (OMNIPAQUE) 350 MG/ML injection 100 mL (100 mLs Intravenous Contrast Given 03/03/20 2133)     Procedures  /  Critical Care .Critical Care Performed by: Maudie Flakes, MD Authorized by: Maudie Flakes, MD   Critical care provider statement:    Critical care time (minutes):  35   Critical care was necessary to treat or prevent imminent or life-threatening deterioration of the following conditions:  CNS failure or compromise (Acute ischemic stroke)   Critical care was time spent  personally by me on the following activities:  Discussions with consultants, evaluation of patient's response to treatment, examination of patient, ordering and performing treatments and interventions, ordering and review of laboratory studies, ordering and review of radiographic studies, pulse oximetry, re-evaluation of patient's condition, obtaining history from patient or surrogate and review of old charts    ED Course and Medical Decision Making  I have reviewed the triage vital signs, the nursing notes, and pertinent available  records from the EMR.  Pertinent labs & imaging results that were available during my care of the patient were reviewed by me and considered in my medical decision making (see below for details).     Question of fall with intracranial bleeding versus acute stroke, no lateralizing neurological findings but clear expressive aphasia.  CT imaging pending.  Patient is outside of the window for code stroke initiation or TPA or thrombectomy treatment.  Last known normal now 31 hours ago.  CT imaging is concerning for MCA stroke at the M1 segment.  Daughter explains that when she initially found her today this afternoon she had some right-sided deficits but this seems to have improved to the point where she seems to have symmetric strength.  Still with expressive aphasia, Dr. Laurence Slate of neurology is consulted and will follow, admitted to hospital service for further care.  Elmer Sow. Pilar Plate, MD [facility] Emergency Medicine [facility] [email or web address]  Final Clinical Impressions(s) / ED Diagnoses     ICD-10-CM   1. Acute ischemic stroke (HCC)  I63.9   2. Fall, initial encounter  [id number]     ED Discharge Orders    None       Discharge Instructions Discussed with and Provided to Patient:   Discharge Instructions   None       Sabas Sous, MD 03/03/20 2324

## 2020-03-03 NOTE — H&P (Signed)
Heather Maximrline D Alcalde ZOX:[id number] DOB: 01/15/1934 DOA: 03/03/2020     PCP: Patient, No Pcp Per   Outpatient Specialists:   NONE   Patient arrived to ER on 03/03/20 at 1722  Patient coming from: home Lives alone,      Chief Complaint:   Chief Complaint  Patient presents with  . Stroke Symptoms    HPI: Heather Nielsen is a [age] y.o. female with medical history significant of arthitis    Presented with sudden onset of aphasia right-sided facial droop right-sided weakness last time was seen normal about 12 on 16 March patient was outside of code stroke window. Today there was noted to have a bruise in her head suspect the patient had an unwitnessed fall.  No cough no fever unable to provide history secondary to aphasia  Pt has not been seen by PCP she does not go to the doctors At her baseline she is very clear minded and walks a few miles  a day  She still drinks one drink a day but stopped smoking   Infectious risk factors:  Reports none    Has  been vaccinated against COVID second shot on 02/22/2020   in house  PCR testing  Pending  No results found for: SARSCOV2NAA   While in ER: Ct showed left sided CVA   ER Provider Called: Neuro    Dr.Aroor They Recommend admit to medicine   Will see   in ER  Hospitalist was called for admission for new CVA  The following Work up has been ordered so far:  Orders Placed This Encounter  Procedures  . Respiratory Panel by RT PCR (Flu A&B, Covid) - Nasopharyngeal Swab  . CT Angio Head W or Wo Contrast  . CT Angio Neck W and/or Wo Contrast  . Ethanol  . Protime-INR  . APTT  . CBC  . Differential  . Comprehensive metabolic panel  . Urine rapid drug screen (hosp performed)  . Urinalysis, Routine w reflex microscopic  . Diet NPO time specified  . Vital signs  . Cardiac Monitoring  . Modified Stroke Scale (mNIHSS) Document mNIHSS assessment every 2 hours for a total of 12 hours  . Stroke swallow screen  . Initiate Carrier Fluid  Protocol  . If O2 sat If O2 Sat < 94%, administer O2 at 2 liters/minute via nasal cannula.  . Consult to neurology  ALL PATIENTS BEING ADMITTED/HAVING PROCEDURES NEED COVID-19 SCREENING  . Consult to hospitalist  ALL PATIENTS BEING ADMITTED/HAVING PROCEDURES NEED COVID-19 SCREENING  . Pulse oximetry, continuous  . I-stat chem 8, ED  . EKG 12-Lead  . ED EKG  . EKG 12-Lead  . Saline lock IV      Following Medications were ordered in ER: Medications  iohexol (OMNIPAQUE) 350 MG/ML injection 100 mL (100 mLs Intravenous Contrast Given 03/03/20 2133)        Consult Orders  (From admission, onward)         Start     Ordered   03/03/20 2148  Consult to hospitalist  ALL PATIENTS BEING ADMITTED/HAVING PROCEDURES NEED COVID-19 SCREENING Paged by Nehemiah SettleBrooke  Once    Comments: ALL PATIENTS BEING ADMITTED/HAVING PROCEDURES NEED COVID-19 SCREENING  Provider:  (Not yet assigned)  Question Answer Comment  Place call to: [facility]   Reason for Consult Admit      03/03/20 2147          Significant initial  Findings: Abnormal Labs Reviewed  CBC - Abnormal; Notable  for the following components:      Result Value   WBC 10.7 (*)    All other components within normal limits  DIFFERENTIAL - Abnormal; Notable for the following components:   Neutro Abs 9.2 (*)    Lymphs Abs 0.5 (*)    All other components within normal limits  COMPREHENSIVE METABOLIC PANEL - Abnormal; Notable for the following components:   Chloride 97 (*)    Glucose, Bld 122 (*)    Anion gap 16 (*)    All other components within normal limits  I-STAT CHEM 8, ED - Abnormal; Notable for the following components:   Sodium 134 (*)    Glucose, Bld 112 (*)    All other components within normal limits    Otherwise labs showing:    Recent Labs  Lab 03/03/20 1854 03/03/20 2030  NA 135 134*  K 3.6 3.6  CO2 22  --   GLUCOSE 122* 112*  BUN 15 17  CREATININE 0.76 0.50  CALCIUM 9.5  --     Cr stable,    Lab  Results  Component Value Date   CREATININE 0.50 03/03/2020   CREATININE 0.76 03/03/2020   CREATININE 0.45 09/08/2016    Recent Labs  Lab 03/03/20 1854  AST 39  ALT 17  ALKPHOS 47  BILITOT 0.7  PROT 6.8  ALBUMIN 4.0   Lab Results  Component Value Date   CALCIUM 9.5 03/03/2020      WBC      Component Value Date/Time   WBC 10.7 (H) 03/03/2020 1854   ANC    Component Value Date/Time   NEUTROABS 9.2 (H) 03/03/2020 1854   ALC No components found for: LYMPHAB   Plt: Lab Results  Component Value Date   PLT 262 03/03/2020    HG/HCT  Stable,     Component Value Date/Time   HGB 12.2 03/03/2020 2030   HCT 36.0 03/03/2020 2030    No results for input(s): LIPASE, AMYLASE in the last 168 hours. No results for input(s): AMMONIA in the last 168 hours.  No components found for: LABALBU    ECG: Ordered Personally reviewed by me showing: HR : 74 Rhythm:  NSR,    no evidence of ischemic changes QTC 445     UA   ordered       Ordered  CT HEAD Occlusion of the left middle cerebral artery at its origin. Hypoattenuation within the anterior left MCA territory and left basal ganglia, consistent with early subacute infarct.   CXR - ordered     ED Triage Vitals  Enc Vitals Group     BP 03/03/20 1743 (!) 164/71     Pulse Rate 03/03/20 1743 73     Resp 03/03/20 1743 16     Temp 03/03/20 1743 98 F (36.7 C)     Temp Source 03/03/20 1743 Oral     SpO2 03/03/20 1743 98 %     Weight --      Height --      Head Circumference --      Peak Flow --      Pain Score 03/03/20 1732 0     Pain Loc --      Pain Edu? --      Excl. in GC? --   TMAX(24)@       Latest  Blood pressure (!) 170/70, pulse 66, temperature 98 F (36.7 C), temperature source Oral, resp. rate 16, SpO2 98 %.  Review of Systems:    Pertinent positives include:  localizing neurological complaints,   Constitutional:  No weight loss, night sweats, Fevers, chills, fatigue, weight loss  HEENT:   No headaches, Difficulty swallowing,Tooth/dental problems,Sore throat,  No sneezing, itching, ear ache, nasal congestion, post nasal drip,  Cardio-vascular:  No chest pain, Orthopnea, PND, anasarca, dizziness, palpitations.no Bilateral lower extremity swelling  GI:  No heartburn, indigestion, abdominal pain, nausea, vomiting, diarrhea, change in bowel habits, loss of appetite, melena, blood in stool, hematemesis Resp:  no shortness of breath at rest. No dyspnea on exertion, No excess mucus, no productive cough, No non-productive cough, No coughing up of blood.No change in color of mucus.No wheezing. Skin:  no rash or lesions. No jaundice GU:  no dysuria, change in color of urine, no urgency or frequency. No straining to urinate.  No flank pain.  Musculoskeletal:  No joint pain or no joint swelling. No decreased range of motion. No back pain.  Psych:  No change in mood or affect. No depression or anxiety. No memory loss.  Neuro: nono tingling, no weakness, no double vision, no gait abnormality, no slurred speech, no confusion  All systems reviewed and apart from Ulen all are negative  Past Medical History:   Past Medical History:  Diagnosis Date  . Arthritis   . Complication of anesthesia   . PONV (postoperative nausea and vomiting)   . Urinary incontinence       Past Surgical History:  Procedure Laterality Date  . KNEE SURGERY  2007   menicus   . ROTATOR CUFF REPAIR Right 2006  . TOTAL HIP ARTHROPLASTY Right 06/16/2015   Procedure: RIGHT TOTAL HIP ARTHROPLASTY ANTERIOR APPROACH;  Surgeon: Gaynelle Arabian, MD;  Location: [facility];  Service: Orthopedics;  Laterality: Right;  . TOTAL HIP ARTHROPLASTY Left 09/06/2016   Procedure: LEFT TOTAL HIP ARTHROPLASTY ANTERIOR APPROACH;  Surgeon: Gaynelle Arabian, MD;  Location: [facility];  Service: Orthopedics;  Laterality: Left;    Social History:  Ambulatory   Independently      reports that she quit smoking about 35 years ago. Her smoking  use included cigarettes. She has a 6.25 pack-year smoking history. She has never used smokeless tobacco. She reports current alcohol use. She reports that she does not use drugs.   Family History:   Family History  Problem Relation Age of Onset  . Heart disease Mother        died age 21  . CAD Brother 67    Allergies: Allergies  Allergen Reactions  . Penicillins Other (See Comments)    Has patient had a PCN reaction causing immediate rash, facial/tongue/throat swelling, SOB or lightheadedness with hypotension:No Has patient had a PCN reaction causing severe rash involving mucus membranes or skin necrosis:No Has patient had a PCN reaction that required hospitalization:No Has patient had a PCN reaction occurring within the last 10 years:No If all of the above answers are "NO", then may proceed with Cephalosporin use. Urinary issues--pt.says "felt like razor blades in her bladder"  . Pneumococcal Vaccines Hives, Itching and Rash      Prior to Admission medications   Medication Sig Start Date End Date Taking? Authorizing Provider  methocarbamol (ROBAXIN) 500 MG tablet Take 1 tablet (500 mg total) by mouth every 6 (six) hours as needed for muscle spasms. 09/07/16   Perkins, Alexzandrew L, PA-C  ondansetron (ZOFRAN) 4 MG tablet Take 1 tablet (4 mg total) by mouth every 6 (six) hours as needed for nausea. Patient not taking:  Reported on 10/17/2016 09/07/16   Julien Girt, Alexzandrew L, PA-C  oxyCODONE (OXY IR/ROXICODONE) 5 MG immediate release tablet Take 1-2 tablets (5-10 mg total) by mouth every 3 (three) hours as needed for breakthrough pain. Patient not taking: Reported on 10/17/2016 09/07/16   Julien Girt, Alexzandrew L, PA-C  rivaroxaban (XARELTO) 10 MG TABS tablet Take 1 tablet (10 mg total) by mouth daily with breakfast. Take Xarelto for two and a half more weeks, then discontinue Xarelto. Once the patient has completed the blood thinner regimen, then take a Baby 81 mg Aspirin daily for three  more weeks. 09/07/16   Perkins, Alexzandrew L, PA-C  traMADol (ULTRAM) 50 MG tablet Take 1-2 tablets (50-100 mg total) by mouth every 6 (six) hours as needed for moderate pain. Take Xarelto for two and a half more weeks, then discontinue Xarelto. Once the patient has completed the blood thinner regimen, then take a Baby 81 mg Aspirin daily for three more weeks. 09/07/16   Perkins, Alexzandrew L, PA-C   Physical Exam: Blood pressure (!) 170/70, pulse 66, temperature 98 F (36.7 C), temperature source Oral, resp. rate 16, SpO2 98 %. 1. General:  in No  Acute distress   Chronically ill -appearing 2. Psychological: Alert and  Oriented but aphasic 3. Head/ENT:    Dry Mucous Membranes                          Head Non traumatic, neck supple                            Poor Dentition 4. SKIN:   decreased Skin turgor,  Skin clean Dry and intact no rash 5. Heart: Regular rate and rhythm no Murmur, no Rub or gallop 6. Lungs:   no wheezes or crackles   7. Abdomen: Soft, non-tender, Non distended  bowel sounds present 8. Lower extremities: no clubbing, cyanosis, no  edema 9. Neurologically right side weakness 10. MSK: Normal range of motion   All other LABS:     Recent Labs  Lab 03/03/20 1854 03/03/20 2030  WBC 10.7*  --   NEUTROABS 9.2*  --   HGB 13.0 12.2  HCT 38.7 36.0  MCV 92.8  --   PLT 262  --      Recent Labs  Lab 03/03/20 1854 03/03/20 2030  NA 135 134*  K 3.6 3.6  CL 97* 99  CO2 22  --   GLUCOSE 122* 112*  BUN 15 17  CREATININE 0.76 0.50  CALCIUM 9.5  --      Recent Labs  Lab 03/03/20 1854  AST 39  ALT 17  ALKPHOS 47  BILITOT 0.7  PROT 6.8  ALBUMIN 4.0       Cultures: No results found for: SDES, SPECREQUEST, CULT, REPTSTATUS   Radiological Exams on Admission: CT Angio Head W or Wo Contrast  Result Date: 03/03/2020 CLINICAL DATA:  Sudden onset aphasia and right-sided facial droop. Right-sided weakness. EXAM: CT ANGIOGRAPHY HEAD AND NECK TECHNIQUE:  Multidetector CT imaging of the head and neck was performed using the standard protocol during bolus administration of intravenous contrast. Multiplanar CT image reconstructions and MIPs were obtained to evaluate the vascular anatomy. Carotid stenosis measurements (when applicable) are obtained utilizing NASCET criteria, using the distal internal carotid diameter as the denominator. CONTRAST:  OMNIPAQUE IOHEXOL 350 MG/ML SOLN COMPARISON:  None. FINDINGS: CT HEAD FINDINGS Brain: There is no mass, hemorrhage  or extra-axial collection. The size and configuration of the ventricles and extra-axial CSF spaces are normal. There is hypoattenuation within the left basal ganglia and left frontal white matter. There is hypoattenuation of the periventricular white matter, most commonly indicating chronic ischemic microangiopathy. Skull: The visualized skull base, calvarium and extracranial soft tissues are normal. Sinuses/Orbits: No fluid levels or advanced mucosal thickening of the visualized paranasal sinuses. No mastoid or middle ear effusion. The orbits are normal. ASPECTS [facility] Stroke Program Early CT Score) - Ganglionic level infarction (caudate, lentiform nuclei, internal capsule, insula, M1-M3 cortex): 4 - Supraganglionic infarction (M4-M6 cortex): 2 Total score (0-10 with 10 being normal): 6 Review of the MIP images confirms the above findings CTA NECK FINDINGS SKELETON: There is no bony spinal canal stenosis. No lytic or blastic lesion. OTHER NECK: Normal pharynx, larynx and major salivary glands. No cervical lymphadenopathy. Unremarkable thyroid gland. UPPER CHEST: No pneumothorax or pleural effusion. No nodules or masses. AORTIC ARCH: There is mild calcific atherosclerosis of the aortic arch. There is no aneurysm, dissection or hemodynamically significant stenosis of the visualized portion of the aorta. Conventional 3 vessel aortic branching pattern. The visualized proximal subclavian arteries are widely  patent. RIGHT CAROTID SYSTEM: Normal without aneurysm, dissection or stenosis. LEFT CAROTID SYSTEM: No dissection, occlusion or aneurysm. There is mixed density atherosclerosis extending into the proximal ICA, resulting in less than 50% stenosis. VERTEBRAL ARTERIES: Left dominant configuration. Mild narrowing of both vertebral artery origins. There is no dissection, occlusion or flow-limiting stenosis to the skull base (V1-V3 segments). CTA HEAD FINDINGS POSTERIOR CIRCULATION: --Vertebral arteries: Normal V4 segments. --Posterior inferior cerebellar arteries (PICA): Patent origins from the vertebral arteries. --Anterior inferior cerebellar arteries (AICA): Patent origins from the basilar artery. --Basilar artery: Normal. --Superior cerebellar arteries: Normal. --Posterior cerebral arteries: Moderate stenosis of the P2 segment of the right PCA. Fetal origin of the left. ANTERIOR CIRCULATION: --Intracranial internal carotid arteries: Atherosclerotic calcification of the internal carotid arteries at the skull base without hemodynamically significant stenosis. --Anterior cerebral arteries (ACA): Normal. Both A1 segments are present. Patent anterior communicating artery (a-comm). --Middle cerebral arteries (MCA): The left middle cerebral artery is occluded at its origin. The right MCA is widely patent. VENOUS SINUSES: As permitted by contrast timing, patent. ANATOMIC VARIANTS: Fetal origin of the left posterior cerebral artery. Review of the MIP images confirms the above findings. IMPRESSION: 1. 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 2. Moderate stenosis of the P2 segment of the right posterior cerebral artery. 3. Hypoattenuation within the anterior left MCA territory and left basal ganglia, consistent with early subacute infarct. ASPECTS is 6. 4. No hemorrhage or mass effect. Critical Value/emergent results were called by telephone at the time of interpretation on 03/03/2020 at 9:45 pm to provider  [facility] , who verbally acknowledged these results. Electronically Signed   By: Deatra Robinson M.D.   On: 03/03/2020 22:02   CT Angio Neck W and/or Wo Contrast  Result Date: 03/03/2020 CLINICAL DATA:  Sudden onset aphasia and right-sided facial droop. Right-sided weakness. EXAM: CT ANGIOGRAPHY HEAD AND NECK TECHNIQUE: Multidetector CT imaging of the head and neck was performed using the standard protocol during bolus administration of intravenous contrast. Multiplanar CT image reconstructions and MIPs were obtained to evaluate the vascular anatomy. Carotid stenosis measurements (when applicable) are obtained utilizing NASCET criteria, using the distal internal carotid diameter as the denominator. CONTRAST:  OMNIPAQUE IOHEXOL 350 MG/ML SOLN COMPARISON:  None. FINDINGS: CT HEAD FINDINGS Brain: There is no  mass, hemorrhage or extra-axial collection. The size and configuration of the ventricles and extra-axial CSF spaces are normal. There is hypoattenuation within the left basal ganglia and left frontal white matter. There is hypoattenuation of the periventricular white matter, most commonly indicating chronic ischemic microangiopathy. Skull: The visualized skull base, calvarium and extracranial soft tissues are normal. Sinuses/Orbits: No fluid levels or advanced mucosal thickening of the visualized paranasal sinuses. No mastoid or middle ear effusion. The orbits are normal. ASPECTS [facility] Stroke Program Early CT Score) - Ganglionic level infarction (caudate, lentiform nuclei, internal capsule, insula, M1-M3 cortex): 4 - Supraganglionic infarction (M4-M6 cortex): 2 Total score (0-10 with 10 being normal): 6 Review of the MIP images confirms the above findings CTA NECK FINDINGS SKELETON: There is no bony spinal canal stenosis. No lytic or blastic lesion. OTHER NECK: Normal pharynx, larynx and major salivary glands. No cervical lymphadenopathy. Unremarkable thyroid gland. UPPER CHEST: No pneumothorax or  pleural effusion. No nodules or masses. AORTIC ARCH: There is mild calcific atherosclerosis of the aortic arch. There is no aneurysm, dissection or hemodynamically significant stenosis of the visualized portion of the aorta. Conventional 3 vessel aortic branching pattern. The visualized proximal subclavian arteries are widely patent. RIGHT CAROTID SYSTEM: Normal without aneurysm, dissection or stenosis. LEFT CAROTID SYSTEM: No dissection, occlusion or aneurysm. There is mixed density atherosclerosis extending into the proximal ICA, resulting in less than 50% stenosis. VERTEBRAL ARTERIES: Left dominant configuration. Mild narrowing of both vertebral artery origins. There is no dissection, occlusion or flow-limiting stenosis to the skull base (V1-V3 segments). CTA HEAD FINDINGS POSTERIOR CIRCULATION: --Vertebral arteries: Normal V4 segments. --Posterior inferior cerebellar arteries (PICA): Patent origins from the vertebral arteries. --Anterior inferior cerebellar arteries (AICA): Patent origins from the basilar artery. --Basilar artery: Normal. --Superior cerebellar arteries: Normal. --Posterior cerebral arteries: Moderate stenosis of the P2 segment of the right PCA. Fetal origin of the left. ANTERIOR CIRCULATION: --Intracranial internal carotid arteries: Atherosclerotic calcification of the internal carotid arteries at the skull base without hemodynamically significant stenosis. --Anterior cerebral arteries (ACA): Normal. Both A1 segments are present. Patent anterior communicating artery (a-comm). --Middle cerebral arteries (MCA): The left middle cerebral artery is occluded at its origin. The right MCA is widely patent. VENOUS SINUSES: As permitted by contrast timing, patent. ANATOMIC VARIANTS: Fetal origin of the left posterior cerebral artery. Review of the MIP images confirms the above findings. IMPRESSION: 1. 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 2. Moderate stenosis of the P2 segment of the  right posterior cerebral artery. 3. Hypoattenuation within the anterior left MCA territory and left basal ganglia, consistent with early subacute infarct. ASPECTS is 6. 4. No hemorrhage or mass effect. Critical Value/emergent results were called by telephone at the time of interpretation on 03/03/2020 at 9:45 pm to provider [facility] , who verbally acknowledged these results. Electronically Signed   By: Deatra Robinson M.D.   On: 03/03/2020 22:02    Chart has been reviewed    Assessment/Plan  86 y.o. female with medical history significant of arthitis     Admitted for left MCA CVA  Present on Admission: . CVA (cerebral vascular accident) [facility]) -   will admit based on TIA/CVA protocol,         Monitor on Tele       MRA/MRI await  results  CTA- showing acute ischemic CVA                Echo to evaluate for possible embolic source,  obtain cardiac enzymes,  ECG,   Lipid panel, TSH.        Order PT/OT evaluation.        keep nothing by mouth will need Speech pathology evaluation Did not pass a swallow eval       Will make sure patient is on antiplatelet ASA    and statin        Allow permissive Hypertension keep BP <220/120        Neurology consulted Have seen pt in ER   Other plan as per orders.  DVT prophylaxis:  SCD   Code Status:    DNR/DNI   as per  family  I had personally discussed CODE STATUS with patient and family   Family Communication:   Family  at  Bedside  plan of care was discussed WITH Daughter  Disposition Plan:      likely will need placement for rehabilitation                            Following barriers for discharge:                                                        Will need to be able to tolerate PO                            Will likely need home health,  Vs palcement                           Will need consultants to evaluate patient prior to discharge                      Would benefit from PT/OT eval prior to DC  Ordered                    Swallow eval - SLP ordered                                        Consults called:     Neurology   Admission status:  ED Disposition    None        inpatient     I Expect 2 midnight stay secondary to severity of patient's current illness need for inpatient interventions justified by the following:  Severe lab/radiological/exam abnormalities including:    FINDING OF NEW cva .     That are currently affecting medical management.   I expect  patient to be hospitalized for 2 midnights requiring inpatient medical care.  Patient is at high risk for adverse outcome (such as loss of life or disability) if not treated.  Indication for inpatient stay as follows:  Severe change from baseline regarding mental status   inability to maintain oral hydration    Need for operative/procedural  intervention   Need for  IV fluids,      Level of care   Tele indefinitely please discontinue once patient no longer qualifies   Precautions: admitted as  PUI  No active isolations  If Covid PCR is negative  - please  DC precautions    PPE: Used by the provider:   P100  eye Goggles,  Gloves    Katarzyna Wolven 03/03/2020, 11:48 PM     [facility]     after 2 AM please page floor coverage PA If 7AM-7PM, please contact the day team taking care of the patient using [email or web address]   Patient was evaluated in the context of the global COVID-19 pandemic, which necessitated consideration that the patient might be at risk for infection with the SARS-CoV-2 virus that causes COVID-19. Institutional protocols and algorithms that pertain to the evaluation of patients at risk for COVID-19 are in a state of rapid change based on information released by regulatory bodies including the CDC and federal and state organizations. These policies and algorithms were followed during the patient's care.

## 2020-03-04 ENCOUNTER — Inpatient Hospital Stay (HOSPITAL_COMMUNITY): Payer: PPO

## 2020-03-04 ENCOUNTER — Other Ambulatory Visit: Payer: Self-pay

## 2020-03-04 DIAGNOSIS — I351 Nonrheumatic aortic (valve) insufficiency: Secondary | ICD-10-CM

## 2020-03-04 DIAGNOSIS — I10 Essential (primary) hypertension: Secondary | ICD-10-CM

## 2020-03-04 DIAGNOSIS — I340 Nonrheumatic mitral (valve) insufficiency: Secondary | ICD-10-CM

## 2020-03-04 DIAGNOSIS — G459 Transient cerebral ischemic attack, unspecified: Secondary | ICD-10-CM

## 2020-03-04 DIAGNOSIS — E782 Mixed hyperlipidemia: Secondary | ICD-10-CM

## 2020-03-04 LAB — ECHOCARDIOGRAM COMPLETE

## 2020-03-04 LAB — LIPID PANEL
Cholesterol: 332 mg/dL — ABNORMAL HIGH (ref 0–200)
HDL: 114 mg/dL (ref >40)
LDL Cholesterol: 206 mg/dL — ABNORMAL HIGH (ref 0–99)
Total CHOL/HDL Ratio: 2.9 RATIO
Triglycerides: 59 mg/dL (ref <150)
VLDL: 12 mg/dL (ref 0–40)

## 2020-03-04 LAB — HEMOGLOBIN A1C
Hgb A1c MFr Bld: 5.8 % — ABNORMAL HIGH (ref 4.8–5.6)
Mean Plasma Glucose: 119.76 mg/dL

## 2020-03-04 IMAGING — DX DG CHEST 2V
2 series · 2 of 2 positions shown · non-contrast
Comparison: None.

CLINICAL DATA: Stroke-like symptoms, sudden onset of aphasia

EXAM:
CHEST - 2 VIEW

[chest lat]
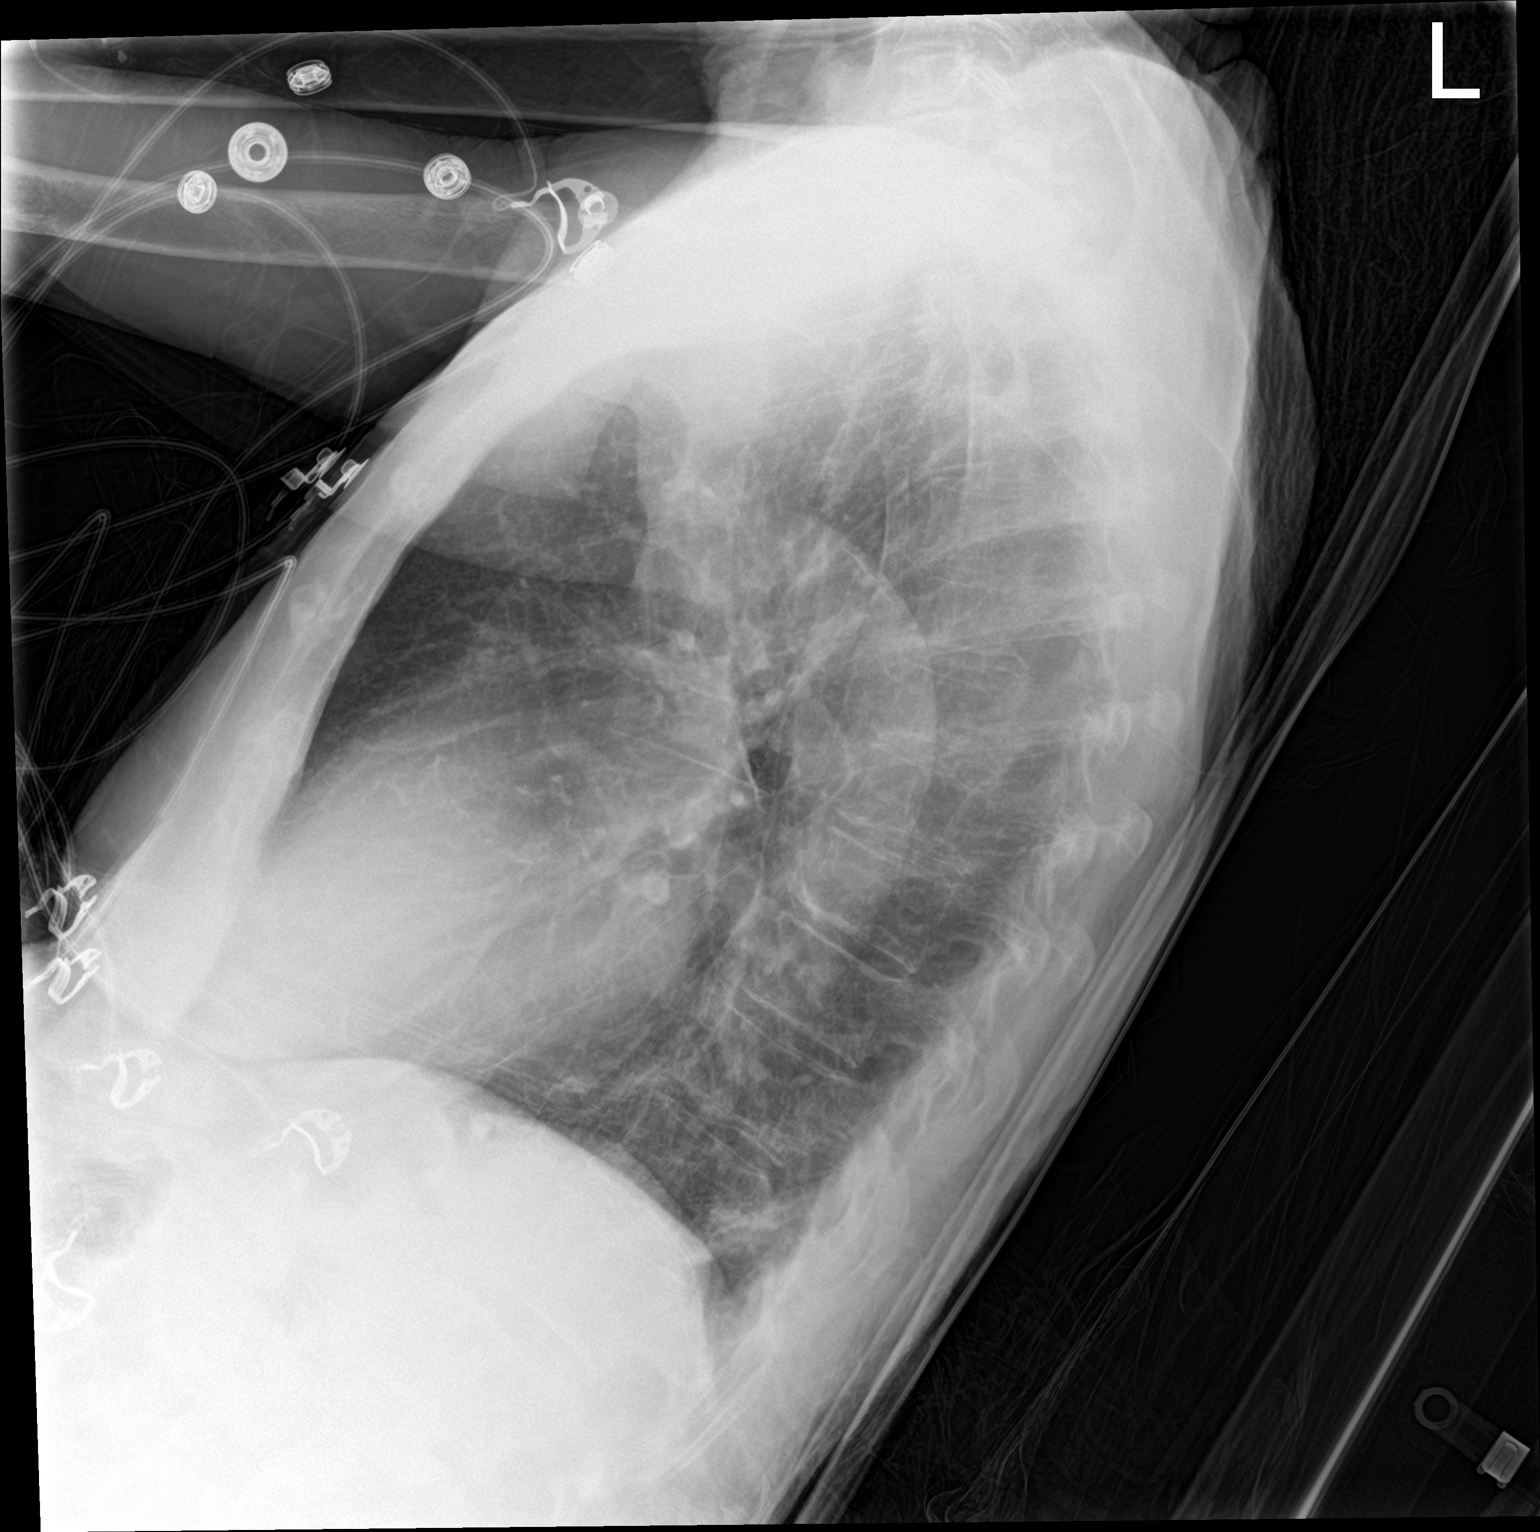

[chest ap]
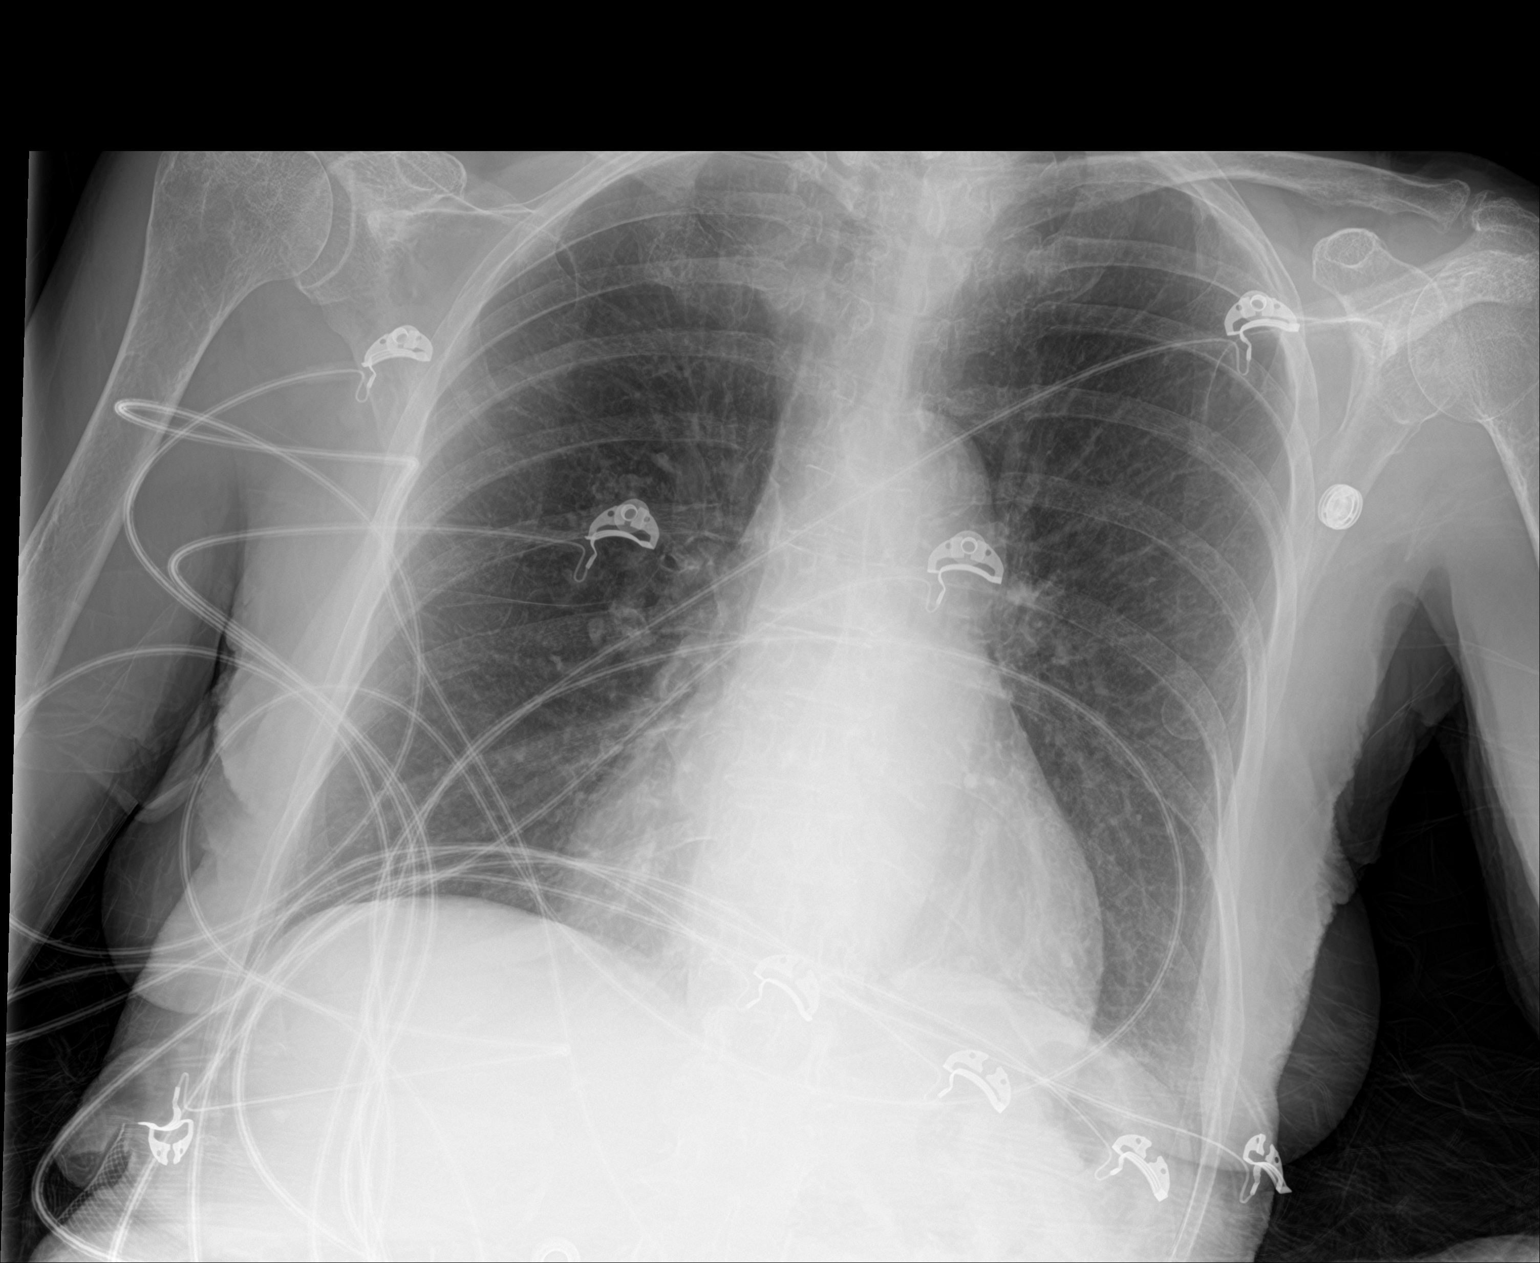

[2 of 2 positions shown; findings below may reference images not displayed]

FINDINGS: Slight hyperinflation along with coarse reticular opacity in the
bases favor chronic interstitial change. No consolidation, features
of edema, pneumothorax, or effusion. The aorta is calcified. The
remaining cardiomediastinal contours are unremarkable. No acute
osseous or soft tissue abnormality. Degenerative changes are present
in the imaged spine and shoulders. Telemetry leads overlie the
chest.
IMPRESSION: Slight hyperinflation and likely chronic interstitial change.

No No acute cardiopulmonary abnormality.

Aortic Atherosclerosis ([J9]-[J9]).

## 2020-03-04 IMAGING — MR MR HEAD W/O CM
9 of 10 series · 36 of 48 positions shown · non-contrast
Comparison: CT and CTA from yesterday

CLINICAL DATA: Sudden onset aphasia and right-sided facial droop

EXAM:
MRI HEAD WITHOUT CONTRAST
TECHNIQUE: Multiplanar, multiecho pulse sequences of the brain and surrounding
structures were obtained without intravenous contrast.

[Series 3: DWI · axial · 3.0mm · 1.09mm/px · z∈[-44,+88]mm · 9 of 90 slices shown (1 of 4)]
[im 1/90]
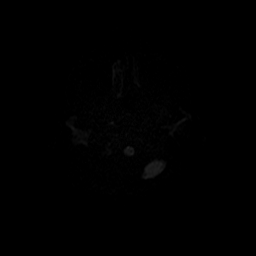
[im 12/90]
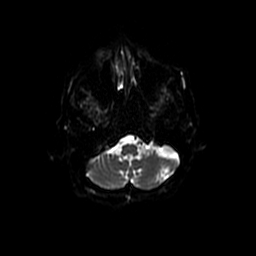
[im 23/90]
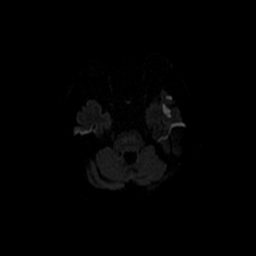
[im 34/90]
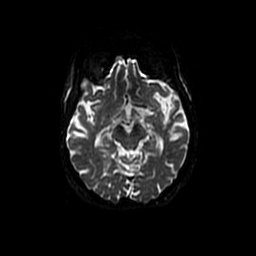
[im 45/90]
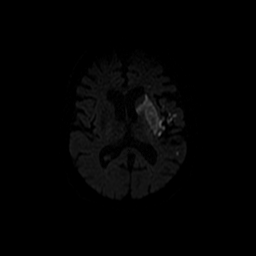
[im 56/90]
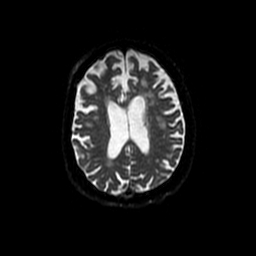
[im 67/90]
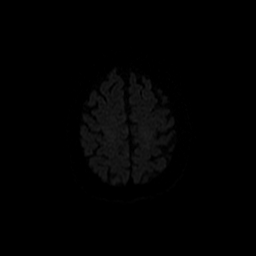
[im 78/90]
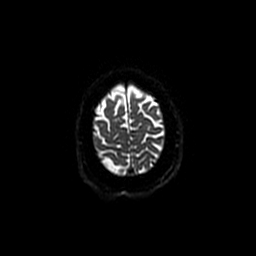
[im 90/90]
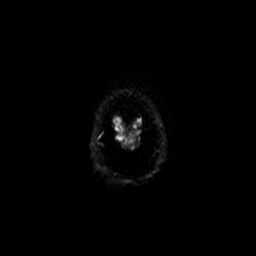

[Series 4: DWI · coronal · 5.0mm · 1.09mm/px · 6 of 66 slices shown (2 of 4)]
[im 1/66]
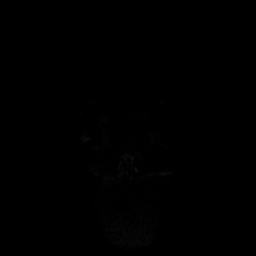
[im 14/66]
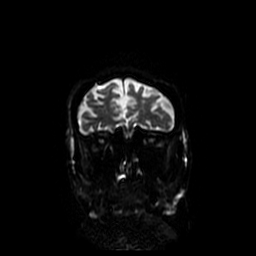
[im 27/66]
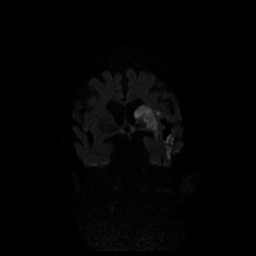
[im 40/66]
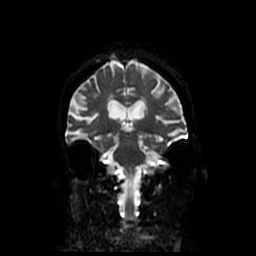
[im 53/66]
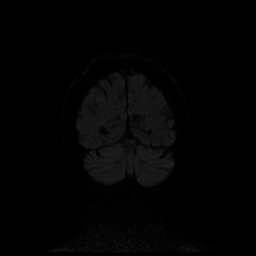
[im 66/66]
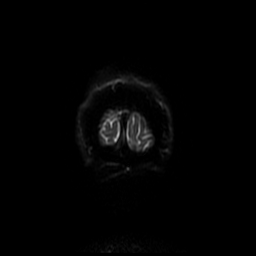

[Series 5: T1 · sagittal · 5.0mm · 0.47mm/px · 2 of 24 slices shown (1 of 2)]
[im 1/24]
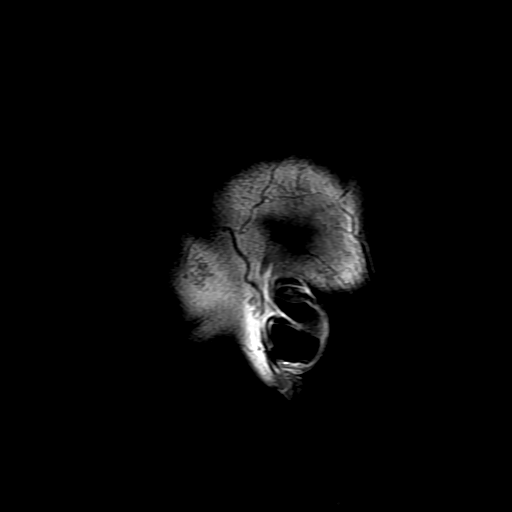
[im 24/24]
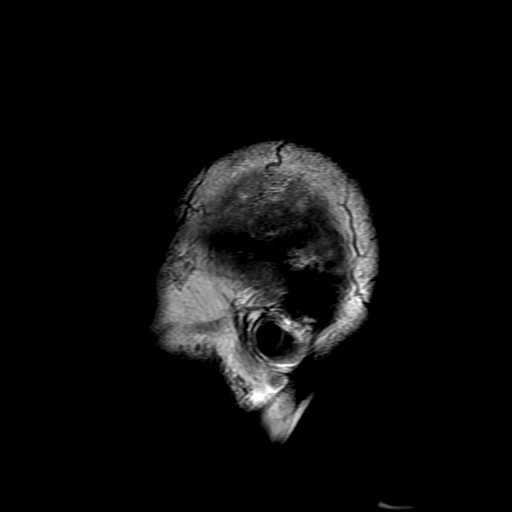

[Series 6: T2 · axial · 5.0mm · 0.43mm/px · z∈[-60,+76]mm · 3 of 24 slices shown (1 of 2)]
[im 1/24]
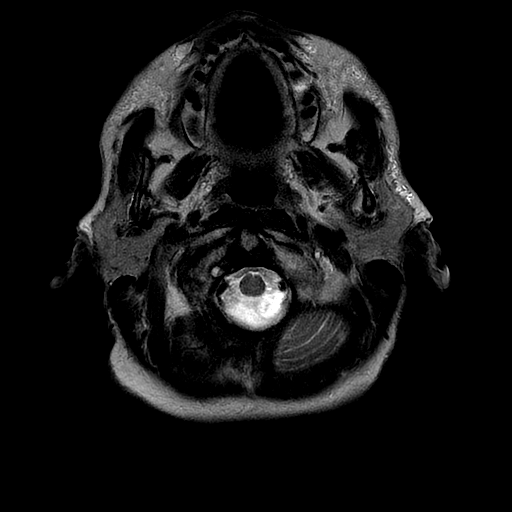
[im 12/24]
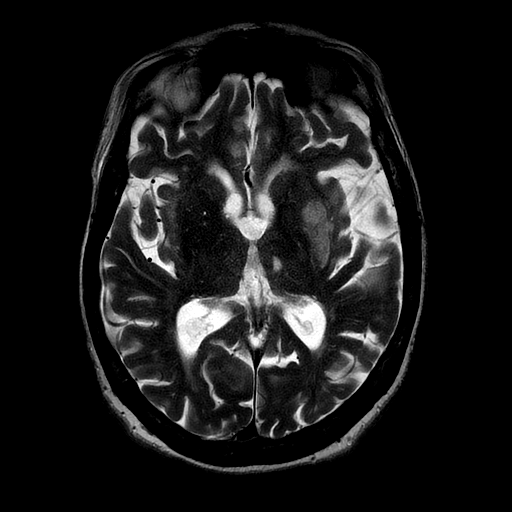
[im 24/24]
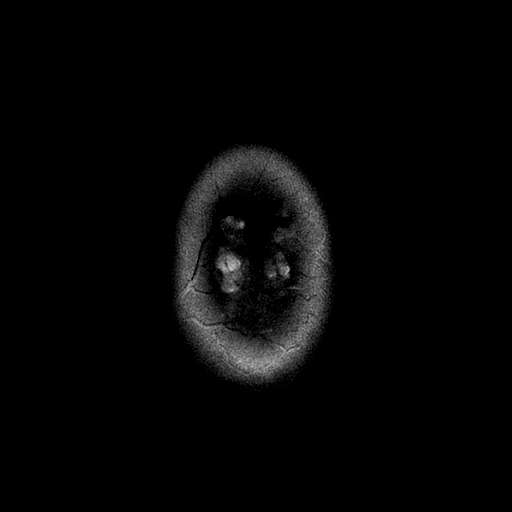

[Series 7: FLAIR · axial · 3.0mm · 0.43mm/px · z∈[-60,+76]mm · 3 of 24 slices shown]
[im 1/24]
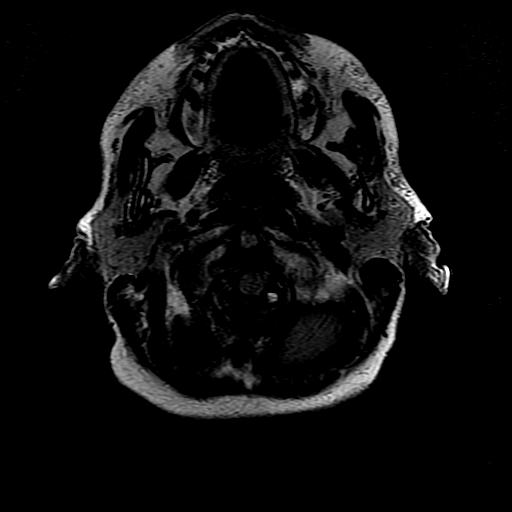
[im 12/24]
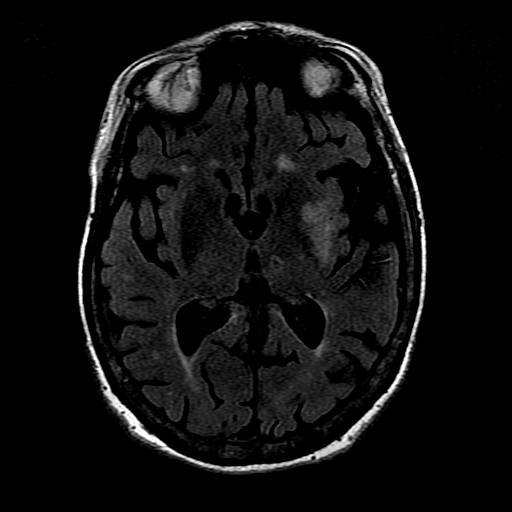
[im 24/24]
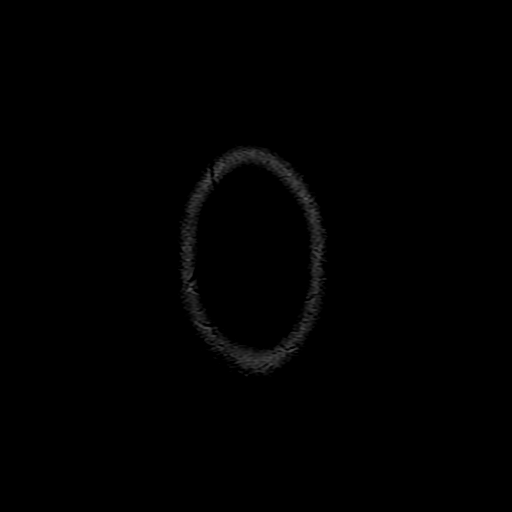

[Series 9: T1 · axial · 3.0mm · 0.47mm/px · 1 of 100 slices shown (2 of 2)]
[im 1/100]
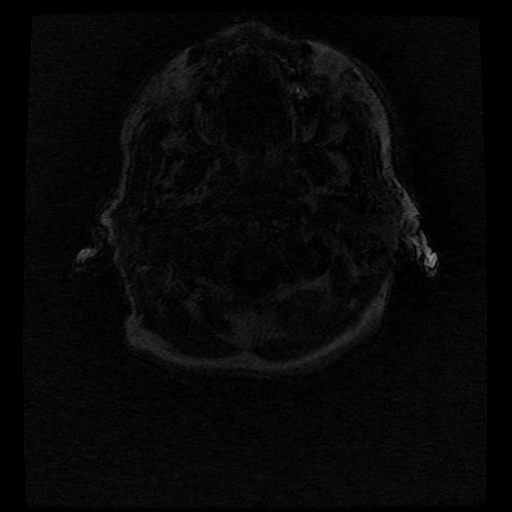

[Series 10: T2 · coronal · 5.0mm · 0.39mm/px · 3 of 25 slices shown (2 of 2)]
[im 1/25]
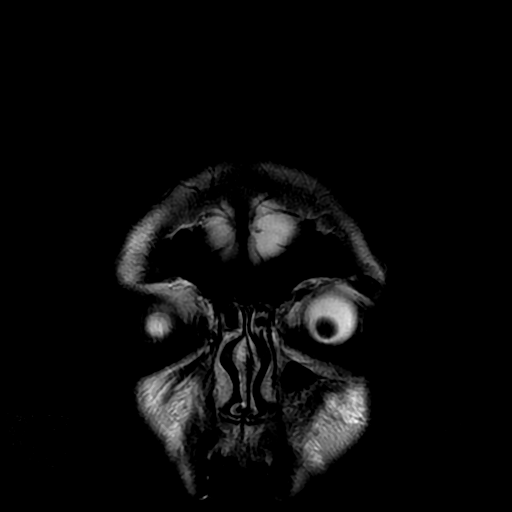
[im 13/25]
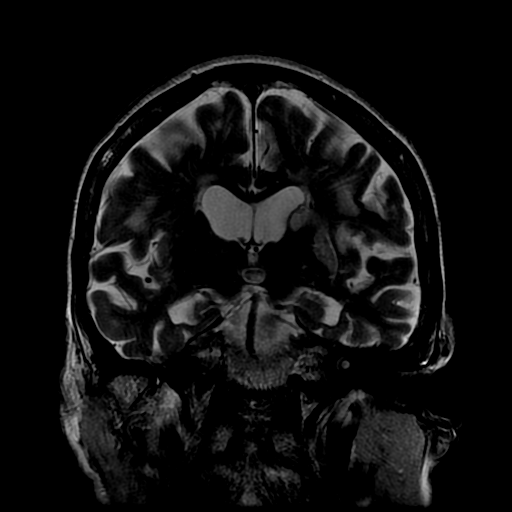
[im 25/25]
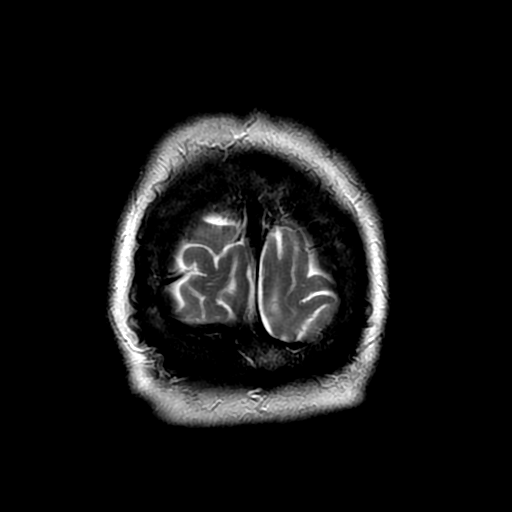

[Series 300: DWI · axial · 3.0mm · 1.09mm/px · z∈[-44,+88]mm · 5 of 45 slices shown (3 of 4)]
[im 1/45]
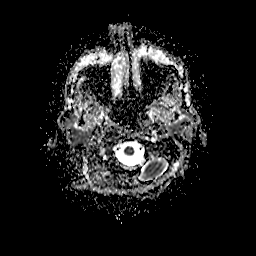
[im 12/45]
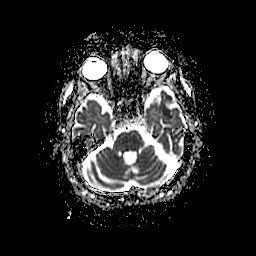
[im 23/45]
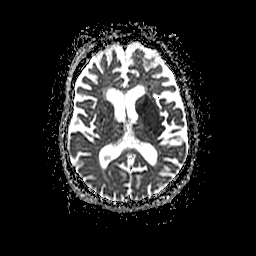
[im 34/45]
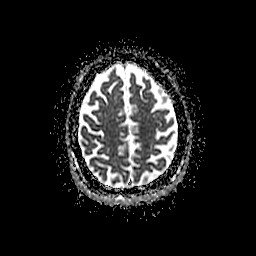
[im 45/45]
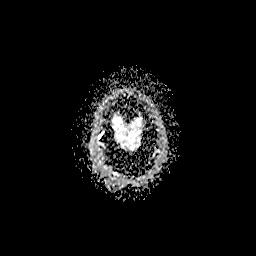

[Series 400: DWI · coronal · 5.0mm · 1.09mm/px · 4 of 33 slices shown (4 of 4)]
[im 1/33]
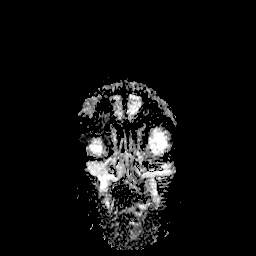
[im 11/33]
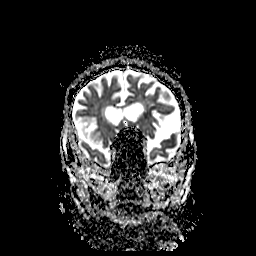
[im 22/33]
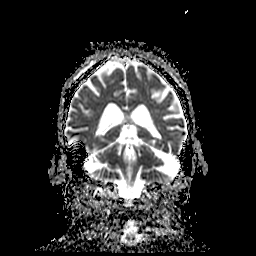
[im 33/33]
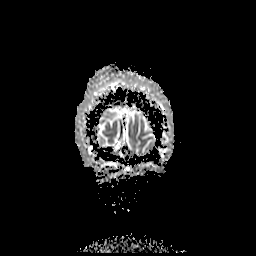

[36 of 48 positions shown; findings below may reference images not displayed]

FINDINGS: Brain: Acute infarct that is confluent at the basal ganglia and
patchy in the superior temporal, insular, and lateral frontal
cortex. No contralateral or posterior circulation acute infarcts. No
acute hemorrhage. No hydrocephalus, collection, or masslike finding.
Small remote left paramedian superior cerebellar infarct. Mild
patchy chronic small vessel ischemia in the cerebral white matter.
Remote lacunar infarct in the left thalamus. Generalized atrophy
that is moderate.

Vascular: Poor visualization of left M2 flow voids, stable from
preceding CTA.

Skull and upper cervical spine: Negative for marrow lesion. There is
degenerative facet and endplate spurring.

Sinuses/Orbits: Negative
IMPRESSION: 1. Acute infarct in the left MCA territory that is confluent at the
basal ganglia and patchy along the inferior division cortex. No
apparent progression compared to CT and CTA yesterday.
2. Background of atrophy and chronic small vessel ischemic injury.

## 2020-03-04 IMAGING — CR DG ABDOMEN 1V
1 series · 1 of 1 positions shown · non-contrast
Comparison: None.

CLINICAL DATA: MRI screening

EXAM:
ABDOMEN - 1 VIEW

[abdomen kub]
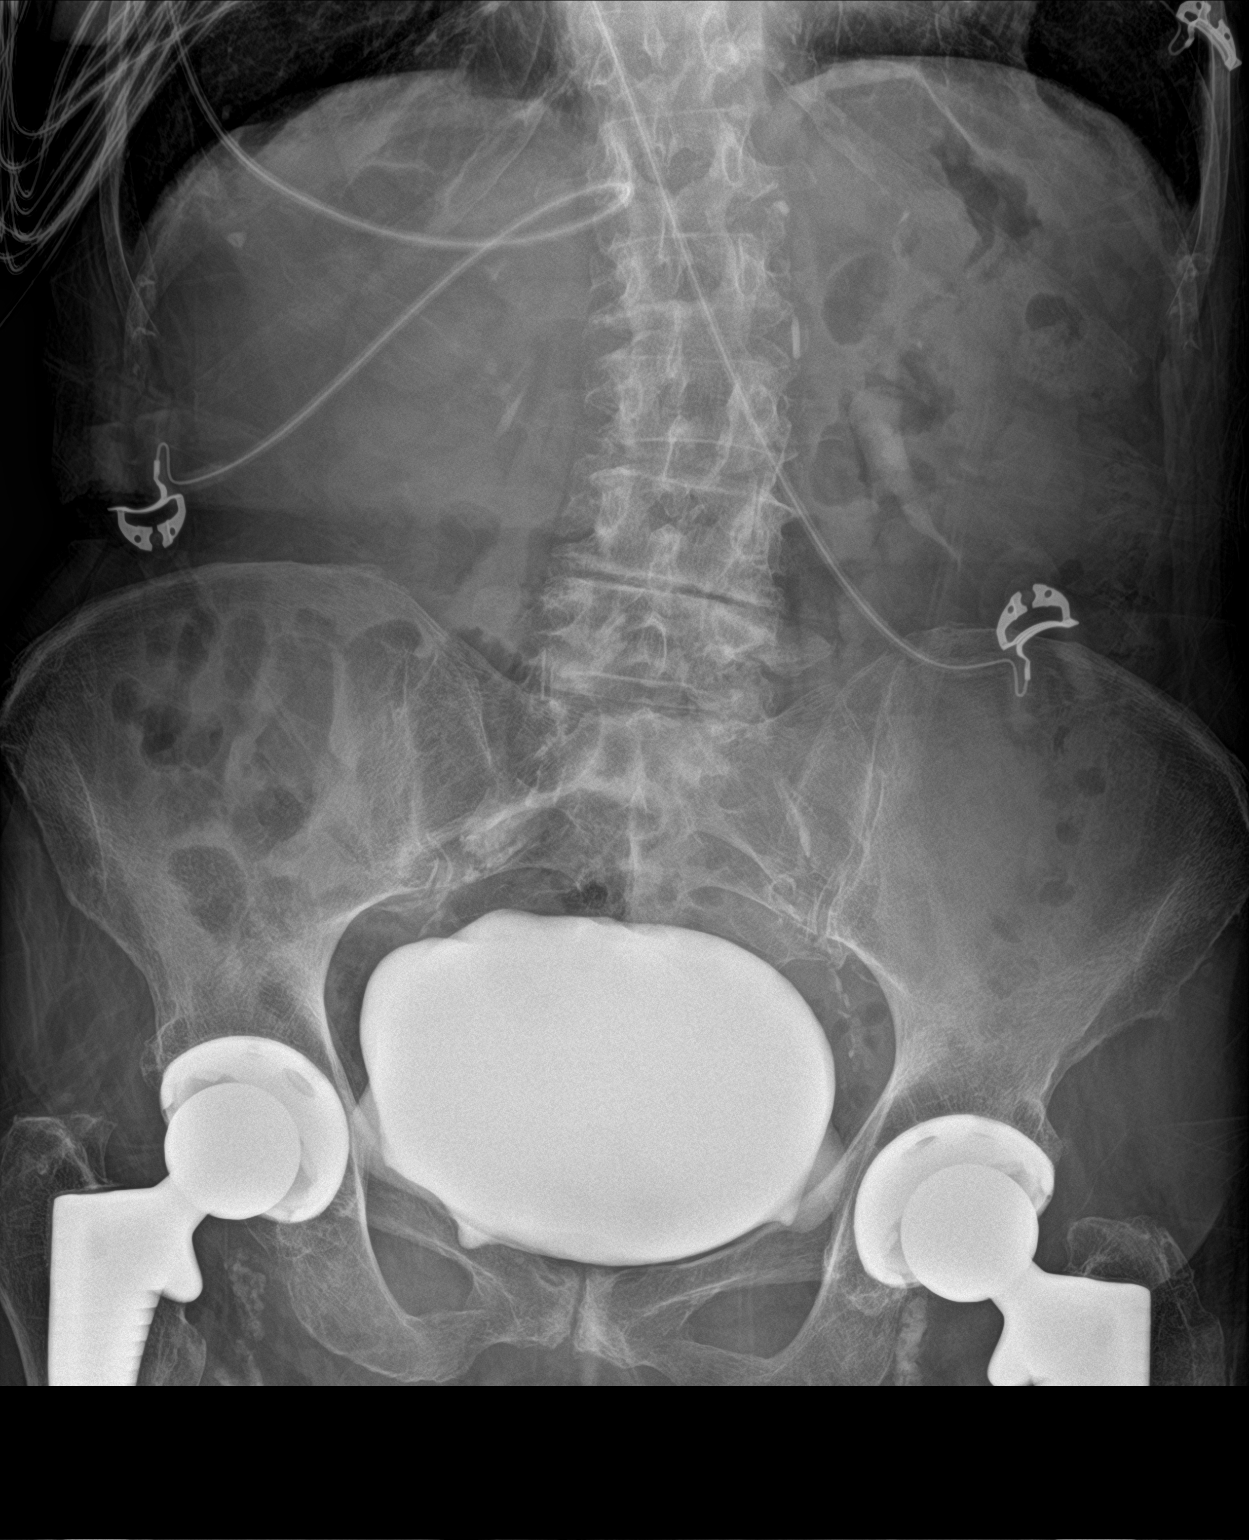

[1 of 1 positions shown; findings below may reference images not displayed]

FINDINGS: Partially visualized bilateral total hip arthroplasty. Excreted
contrast distends the urinary bladder. No disproportionately dilated
small bowel loops. No evidence of pneumatosis or pneumoperitoneum.
Moderate lumbar spondylosis. Clear lung bases.
IMPRESSION: Partially visualized bilateral total hip arthroplasty. Excreted
contrast distends the urinary bladder. Normal obstructive bowel gas
pattern

## 2020-03-04 MED ORDER — LATANOPROST 0.005 % OP SOLN
1.0000 [drp] | Freq: Every day | OPHTHALMIC | Status: DC
  Administered 2020-03-05 – 2020-03-06 (×2): 1 [drp] via OPHTHALMIC
  Filled 2020-03-04: qty 2.5

## 2020-03-04 MED ORDER — ACETAMINOPHEN 325 MG PO TABS: 650.0000 mg | ORAL_TABLET | ORAL | Status: DC | PRN

## 2020-03-04 MED ORDER — ASPIRIN 300 MG RE SUPP: 300.0000 mg | Freq: Every day | RECTAL | Status: DC

## 2020-03-04 MED ORDER — STROKE: EARLY STAGES OF RECOVERY BOOK
Freq: Once | Status: DC
  Filled 2020-03-04 (×2): qty 1

## 2020-03-04 MED ORDER — SODIUM CHLORIDE 0.9 % IV SOLN: INTRAVENOUS | Status: DC

## 2020-03-04 MED ORDER — ACETAMINOPHEN 160 MG/5ML PO SOLN: 650.0000 mg | ORAL | Status: DC | PRN

## 2020-03-04 MED ORDER — ACETAMINOPHEN 650 MG RE SUPP: 650.0000 mg | RECTAL | Status: DC | PRN

## 2020-03-04 MED ORDER — ASPIRIN 325 MG PO TABS
325.0000 mg | ORAL_TABLET | Freq: Every day | ORAL | Status: DC
  Administered 2020-03-04 – 2020-03-06 (×3): 325 mg via ORAL
  Filled 2020-03-04 (×3): qty 1

## 2020-03-04 NOTE — Evaluation (Signed)
Physical Therapy Evaluation Patient Details Name: Heather Nielsen MRN: [id number] DOB: Nov 10, 1934 Today's Date: 03/04/2020   History of Present Illness  Pt is an 86 y/o female admitted secondary to dysarthria and R sided weakness. Found to have L MCA infarct. PMH includes bilateral THA.   Clinical Impression  Pt admitted secondary to problem above with deficits below. Presenting with balance deficits, especially with backwards walking, and RLE weakness. Required min to min guard A for mobility using HHA. Feel pt would benefit from follow up PT, likely HHPT vs oupatient, however, will determine most appropriate follow up pending mobility progression. Will continue to follow acutely to maximize functional mobility independence and safety.     Follow Up Recommendations Supervision for mobility/OOB;Other (comment)(TBD)    Equipment Recommendations  Other (comment)(TBD)    Recommendations for Other Services       Precautions / Restrictions Precautions Precautions: Fall Restrictions Weight Bearing Restrictions: No      Mobility  Bed Mobility Overal bed mobility: Needs Assistance Bed Mobility: Supine to Sit;Sit to Supine     Supine to sit: Min guard Sit to supine: Min guard   General bed mobility comments: Min guard for safety throughout bed mobility tasks.   Transfers Overall transfer level: Needs assistance Equipment used: 1 person hand held assist Transfers: Sit to/from Stand Sit to Stand: Min guard         General transfer comment: Min guard for steadying assist to stand.   Ambulation/Gait Ambulation/Gait assistance: Min guard;Min assist Gait Distance (Feet): 10 Feet Assistive device: 1 person hand held assist Gait Pattern/deviations: Step-through pattern;Decreased stride length Gait velocity: Decreased   General Gait Details: took forwards and backwards steps at EOB. Required min A to take steps backwards secondary to instability. Min guard A for balance to take  forward steps.   Stairs            Wheelchair Mobility    Modified Rankin (Stroke Patients Only)       Balance Overall balance assessment: Needs assistance Sitting-balance support: Bilateral upper extremity supported;Feet supported Sitting balance-Leahy Scale: Fair     Standing balance support: Single extremity supported;During functional activity Standing balance-Leahy Scale: Poor Standing balance comment: Reliant on at least 1 UE support                              Pertinent Vitals/Pain Pain Assessment: No/denies pain    Home Living Family/patient expects to be discharged to:: Private residence Living Arrangements: Alone Available Help at Discharge: Family;Available 24 hours/day Type of Home: Apartment Home Access: Elevator     Home Layout: One level Home Equipment: Cane - single point      Prior Function Level of Independence: Independent               Hand Dominance        Extremity/Trunk Assessment   Upper Extremity Assessment Upper Extremity Assessment: Defer to OT evaluation    Lower Extremity Assessment Lower Extremity Assessment: RLE deficits/detail RLE Deficits / Details: Grossly 4/5 throughout.  RLE Sensation: WNL    Cervical / Trunk Assessment Cervical / Trunk Assessment: Normal  Communication   Communication: Expressive difficulties(dysarthria)  Cognition Arousal/Alertness: Awake/alert Behavior During Therapy: WFL for tasks assessed/performed Overall Cognitive Status: Within Functional Limits for tasks assessed  General Comments: Dysarthria noted, however, cognition seems [facility] for basic tasks.       General Comments General comments (skin integrity, edema, etc.): Pt's son present during session     Exercises     Assessment/Plan    PT Assessment Patient needs continued PT services  PT Problem List Decreased strength;Decreased mobility;Decreased balance;Decreased  activity tolerance;Decreased knowledge of use of DME;Decreased knowledge of precautions       PT Treatment Interventions Gait training;DME instruction;Functional mobility training;Therapeutic activities;Therapeutic exercise;Balance training;Patient/family education    PT Goals (Current goals can be found in the Care Plan section)  Acute Rehab PT Goals Patient Stated Goal: to go home PT Goal Formulation: With patient Time For Goal Achievement: 03/18/20 Potential to Achieve Goals: Good    Frequency Min 4X/week   Barriers to discharge        Co-evaluation               AM-PAC PT "6 Clicks" Mobility  Outcome Measure Help needed turning from your back to your side while in a flat bed without using bedrails?: A Little Help needed moving from lying on your back to sitting on the side of a flat bed without using bedrails?: A Little Help needed moving to and from a bed to a chair (including a wheelchair)?: A Little Help needed standing up from a chair using your arms (e.g., wheelchair or bedside chair)?: A Little Help needed to walk in hospital room?: A Little Help needed climbing 3-5 steps with a railing? : A Lot 6 Click Score: 17    End of Session Equipment Utilized During Treatment: Gait belt Activity Tolerance: Patient tolerated treatment well Patient left: in bed;with call bell/phone within reach;with family/visitor present Nurse Communication: Mobility status;Other (comment)(pt asking for food) PT Visit Diagnosis: Unsteadiness on feet (R26.81);Muscle weakness (generalized) (M62.81)    Time: 7322-0254 PT Time Calculation (min) (ACUTE ONLY): 19 min   Charges:   PT Evaluation $PT Eval Moderate Complexity: 1 Mod          Reuel Derby, PT, DPT  Acute Rehabilitation Services  Pager: [phone] Office: [phone]   Rudean Hitt 03/04/2020, 1:43 PM

## 2020-03-04 NOTE — ED Notes (Signed)
Pt transported to MRI 

## 2020-03-04 NOTE — Consult Note (Signed)
Requesting Physician: Dr. Pilar Plate     Chief Complaint: " Not acting herself", difficulty with language  History obtained from: Patient and Chart     HPI:                                                                                                                                       Heather Nielsen is a 86 y.o. female with past medical history of arthritis presents to the emergency department with aphasia.  Last known normal was around noon yesterday.  Patient lives alone and this was the last time daughter spoke to her.  Patient daughter found her not acting herself and unable to perform complete sentences.  Was brought to the emergency department.  Code stroke was not activated as patient was outside 24-hour window for muscle normal.  CT head showed a moderate sized left MCA stroke with CT aspects of 6.  CT angiogram demonstrated left M1 occlusion.  CT perfusion was performed, however felt to be underestimation of infarct or by CBF criteria with the actual core volume close to 50 mL, overestimation ischemic penumbra on RAPID.   Date last known well: 3.16.21 Time last known well: 12 PM tPA Given: no, outside window NIHSS: 3 ( 2 for aphasia, 1 for questions) Baseline MRS 0    Past Medical History:  Diagnosis Date  . Arthritis   . Complication of anesthesia   . PONV (postoperative nausea and vomiting)   . Urinary incontinence     Past Surgical History:  Procedure Laterality Date  . KNEE SURGERY  2007   menicus   . ROTATOR CUFF REPAIR Right 2006  . TOTAL HIP ARTHROPLASTY Right 06/16/2015   Procedure: RIGHT TOTAL HIP ARTHROPLASTY ANTERIOR APPROACH;  Surgeon: Ollen Gross, MD;  Location: [facility];  Service: Orthopedics;  Laterality: Right;  . TOTAL HIP ARTHROPLASTY Left 09/06/2016   Procedure: LEFT TOTAL HIP ARTHROPLASTY ANTERIOR APPROACH;  Surgeon: Ollen Gross, MD;  Location: [facility];  Service: Orthopedics;  Laterality: Left;    Family History  Problem Relation Age of Onset  .  Heart disease Mother        died age 43  . CAD Brother 26   Social History:  reports that she quit smoking about 35 years ago. Her smoking use included cigarettes. She has a 6.25 pack-year smoking history. She has never used smokeless tobacco. She reports current alcohol use. She reports that she does not use drugs.  Allergies:  Allergies  Allergen Reactions  . Penicillins Other (See Comments)    Has patient had a PCN reaction causing immediate rash, facial/tongue/throat swelling, SOB or lightheadedness with hypotension:No Has patient had a PCN reaction causing severe rash involving mucus membranes or skin necrosis:No Has patient had a PCN reaction that required hospitalization:No Has patient had a PCN reaction occurring within the last 10 years:No If all of the above answers are "NO", then may  proceed with Cephalosporin use. Urinary issues--pt.says "felt like razor blades in her bladder"  . Pneumococcal Vaccines Hives, Itching and Rash    Medications:                                                                                                                        I reviewed home medications   ROS:                                                                                                                                     14 systems reviewed and negative except above   Examination:                                                                                                      General: Appears well-developed  Psych: Affect appropriate to situation Eyes: No scleral injection HENT: No OP obstrucion Head: Normocephalic.  Cardiovascular: Normal rate and regular rhythm. Respiratory: Effort normal and breath sounds normal to anterior ascultation GI: Soft.  No distension. There is no tenderness.  Skin: WDI    Neurological Examination Mental Status: Alert, oriented to place and person, incorrectly stated the month.  Has difficulty with expression and forming  sentences.  Able to identify simple objects and follow simple commands Cranial Nerves: II: Visual fields grossly normal,  III,IV, VI: ptosis not present, extra-ocular motions intact bilaterally, pupils equal, round, reactive to light and accommodation V,VII: smile symmetric, facial light touch sensation normal bilaterally VIII: hearing normal bilaterally IX,X: uvula rises symmetrically XI: bilateral shoulder shrug XII: midline tongue extension Motor: Right : Upper extremity   5/5    Left:     Upper extremity   5/5  Lower extremity   5/5     Lower extremity   5/5 Tone and bulk:normal tone throughout; no atrophy noted Sensory: Pinprick and light touch intact throughout, bilaterally Plantars: Right: downgoing   Left: downgoing Cerebellar: normal finger-to-nose      Lab Results: Basic Metabolic Panel:  Recent Labs  Lab 03/03/20 1854 03/03/20 2030  NA 135 134*  K 3.6 3.6  CL 97* 99  CO2 22  --   GLUCOSE 122* 112*  BUN 15 17  CREATININE 0.76 0.50  CALCIUM 9.5  --     CBC: Recent Labs  Lab 03/03/20 1854 03/03/20 2030  WBC 10.7*  --   NEUTROABS 9.2*  --   HGB 13.0 12.2  HCT 38.7 36.0  MCV 92.8  --   PLT 262  --     Coagulation Studies: Recent Labs    03/03/20 1854  LABPROT 12.9  INR 1.0    Imaging: CT Angio Head W or Wo Contrast  Result Date: 03/03/2020 CLINICAL DATA:  Sudden onset aphasia and right-sided facial droop. Right-sided weakness. EXAM: CT ANGIOGRAPHY HEAD AND NECK TECHNIQUE: Multidetector CT imaging of the head and neck was performed using the standard protocol during bolus administration of intravenous contrast. Multiplanar CT image reconstructions and MIPs were obtained to evaluate the vascular anatomy. Carotid stenosis measurements (when applicable) are obtained utilizing NASCET criteria, using the distal internal carotid diameter as the denominator. CONTRAST:  100mL OMNIPAQUE IOHEXOL 350 MG/ML SOLN COMPARISON:  None. FINDINGS: CT HEAD FINDINGS  Brain: There is no mass, hemorrhage or extra-axial collection. The size and configuration of the ventricles and extra-axial CSF spaces are normal. There is hypoattenuation within the left basal ganglia and left frontal white matter. There is hypoattenuation of the periventricular white matter, most commonly indicating chronic ischemic microangiopathy. Skull: The visualized skull base, calvarium and extracranial soft tissues are normal. Sinuses/Orbits: No fluid levels or advanced mucosal thickening of the visualized paranasal sinuses. No mastoid or middle ear effusion. The orbits are normal. ASPECTS [facility]([place] Stroke Program Early CT Score) - Ganglionic level infarction (caudate, lentiform nuclei, internal capsule, insula, M1-M3 cortex): 4 - Supraganglionic infarction (M4-M6 cortex): 2 Total score (0-10 with 10 being normal): 6 Review of the MIP images confirms the above findings CTA NECK FINDINGS SKELETON: There is no bony spinal canal stenosis. No lytic or blastic lesion. OTHER NECK: Normal pharynx, larynx and major salivary glands. No cervical lymphadenopathy. Unremarkable thyroid gland. UPPER CHEST: No pneumothorax or pleural effusion. No nodules or masses. AORTIC ARCH: There is mild calcific atherosclerosis of the aortic arch. There is no aneurysm, dissection or hemodynamically significant stenosis of the visualized portion of the aorta. Conventional 3 vessel aortic branching pattern. The visualized proximal subclavian arteries are widely patent. RIGHT CAROTID SYSTEM: Normal without aneurysm, dissection or stenosis. LEFT CAROTID SYSTEM: No dissection, occlusion or aneurysm. There is mixed density atherosclerosis extending into the proximal ICA, resulting in less than 50% stenosis. VERTEBRAL ARTERIES: Left dominant configuration. Mild narrowing of both vertebral artery origins. There is no dissection, occlusion or flow-limiting stenosis to the skull base (V1-V3 segments). CTA HEAD FINDINGS POSTERIOR CIRCULATION:  --Vertebral arteries: Normal V4 segments. --Posterior inferior cerebellar arteries (PICA): Patent origins from the vertebral arteries. --Anterior inferior cerebellar arteries (AICA): Patent origins from the basilar artery. --Basilar artery: Normal. --Superior cerebellar arteries: Normal. --Posterior cerebral arteries: Moderate stenosis of the P2 segment of the right PCA. Fetal origin of the left. ANTERIOR CIRCULATION: --Intracranial internal carotid arteries: Atherosclerotic calcification of the internal carotid arteries at the skull base without hemodynamically significant stenosis. --Anterior cerebral arteries (ACA): Normal. Both A1 segments are present. Patent anterior communicating artery (a-comm). --Middle cerebral arteries (MCA): The left middle cerebral artery is occluded at its origin. The right MCA is widely patent. VENOUS SINUSES: As permitted by contrast timing, patent. ANATOMIC  VARIANTS: Fetal origin of the left posterior cerebral artery. Review of the MIP images confirms the above findings. IMPRESSION: 1. 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 2. Moderate stenosis of the P2 segment of the right posterior cerebral artery. 3. Hypoattenuation within the anterior left MCA territory and left basal ganglia, consistent with early subacute infarct. ASPECTS is 6. 4. No hemorrhage or mass effect. Critical Value/emergent results were called by telephone at the time of interpretation on 03/03/2020 at 9:45 pm to provider [facility] , who verbally acknowledged these results. Electronically Signed   By: Deatra Robinson M.D.   On: 03/03/2020 22:02   CT Angio Neck W and/or Wo Contrast  Result Date: 03/03/2020 CLINICAL DATA:  Sudden onset aphasia and right-sided facial droop. Right-sided weakness. EXAM: CT ANGIOGRAPHY HEAD AND NECK TECHNIQUE: Multidetector CT imaging of the head and neck was performed using the standard protocol during bolus administration of intravenous contrast. Multiplanar CT  image reconstructions and MIPs were obtained to evaluate the vascular anatomy. Carotid stenosis measurements (when applicable) are obtained utilizing NASCET criteria, using the distal internal carotid diameter as the denominator. CONTRAST:  OMNIPAQUE IOHEXOL 350 MG/ML SOLN COMPARISON:  None. FINDINGS: CT HEAD FINDINGS Brain: There is no mass, hemorrhage or extra-axial collection. The size and configuration of the ventricles and extra-axial CSF spaces are normal. There is hypoattenuation within the left basal ganglia and left frontal white matter. There is hypoattenuation of the periventricular white matter, most commonly indicating chronic ischemic microangiopathy. Skull: The visualized skull base, calvarium and extracranial soft tissues are normal. Sinuses/Orbits: No fluid levels or advanced mucosal thickening of the visualized paranasal sinuses. No mastoid or middle ear effusion. The orbits are normal. ASPECTS [facility] Stroke Program Early CT Score) - Ganglionic level infarction (caudate, lentiform nuclei, internal capsule, insula, M1-M3 cortex): 4 - Supraganglionic infarction (M4-M6 cortex): 2 Total score (0-10 with 10 being normal): 6 Review of the MIP images confirms the above findings CTA NECK FINDINGS SKELETON: There is no bony spinal canal stenosis. No lytic or blastic lesion. OTHER NECK: Normal pharynx, larynx and major salivary glands. No cervical lymphadenopathy. Unremarkable thyroid gland. UPPER CHEST: No pneumothorax or pleural effusion. No nodules or masses. AORTIC ARCH: There is mild calcific atherosclerosis of the aortic arch. There is no aneurysm, dissection or hemodynamically significant stenosis of the visualized portion of the aorta. Conventional 3 vessel aortic branching pattern. The visualized proximal subclavian arteries are widely patent. RIGHT CAROTID SYSTEM: Normal without aneurysm, dissection or stenosis. LEFT CAROTID SYSTEM: No dissection, occlusion or aneurysm. There is mixed  density atherosclerosis extending into the proximal ICA, resulting in less than 50% stenosis. VERTEBRAL ARTERIES: Left dominant configuration. Mild narrowing of both vertebral artery origins. There is no dissection, occlusion or flow-limiting stenosis to the skull base (V1-V3 segments). CTA HEAD FINDINGS POSTERIOR CIRCULATION: --Vertebral arteries: Normal V4 segments. --Posterior inferior cerebellar arteries (PICA): Patent origins from the vertebral arteries. --Anterior inferior cerebellar arteries (AICA): Patent origins from the basilar artery. --Basilar artery: Normal. --Superior cerebellar arteries: Normal. --Posterior cerebral arteries: Moderate stenosis of the P2 segment of the right PCA. Fetal origin of the left. ANTERIOR CIRCULATION: --Intracranial internal carotid arteries: Atherosclerotic calcification of the internal carotid arteries at the skull base without hemodynamically significant stenosis. --Anterior cerebral arteries (ACA): Normal. Both A1 segments are present. Patent anterior communicating artery (a-comm). --Middle cerebral arteries (MCA): The left middle cerebral artery is occluded at its origin. The right MCA is widely patent. VENOUS SINUSES: As permitted by contrast timing,  patent. ANATOMIC VARIANTS: Fetal origin of the left posterior cerebral artery. Review of the MIP images confirms the above findings. IMPRESSION: 1. 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 2. Moderate stenosis of the P2 segment of the right posterior cerebral artery. 3. Hypoattenuation within the anterior left MCA territory and left basal ganglia, consistent with early subacute infarct. ASPECTS is 6. 4. No hemorrhage or mass effect. Critical Value/emergent results were called by telephone at the time of interpretation on 03/03/2020 at 9:45 pm to provider [facility] , who verbally acknowledged these results. Electronically Signed   By: Ulyses Jarred M.D.   On: 03/03/2020 22:02   CT CEREBRAL PERFUSION W  CONTRAST  Result Date: 03/03/2020 CLINICAL DATA:  Left MCA occlusion EXAM: CT PERFUSION BRAIN TECHNIQUE: Multiphase CT imaging of the brain was performed following IV bolus contrast injection. Subsequent parametric perfusion maps were calculated using RAPID software. CONTRAST:  52mL OMNIPAQUE IOHEXOL 350 MG/ML SOLN COMPARISON:  None. FINDINGS: CT Brain Perfusion Findings: CBF (<30%) Volume: 1mL Perfusion (Tmax>6.0s) volume: 32mL Mismatch Volume: 3mL ASPECTS on noncontrast CT Head: 6 at 9:04 p.m. today. Infarct Core: Based on the noncontrast head CT, the infarct core is larger than the 5 mL calculated by CBF. Qualitatively, the infarct core is probably approximately half of the ischemic volume calculated by Tmax>6.0s, i.e. 50 mL. Infarction Location:Left MCA territory, predominantly the insula and left basal ganglia. IMPRESSION: 1. Underestimation of infarct core by cerebral blood flow criteria. Actual core volume is probably closer to 50 mL. 2. Over estimation of ischemic penumbra, with actual penumbra volume probably on the order 40-50 mL. Electronically Signed   By: Ulyses Jarred M.D.   On: 03/03/2020 23:46     ASSESSMENT AND PLAN    86 y.o. female with past medical history of arthritis presents to the emergency department with aphasia due to left MCA acute ischemic stroke.  Patient doing surprisingly well for size of stroke. Not a candidate for IR due to low NIH stroke scale, outside 24-hour window.  Acute left MCA ischemic Stroke   Risk factors Etiology:   Recommend # MRI of the brain without contrast #MRA Head and neck  #Transthoracic Echo  # Start patient on ASA 325mg  daily #Start or continue Atorvastatin 80 mg/other high intensity statin # BP goal: permissive HTN upto 220/120 mmHg ( 185/110 if patient has CHF, CKD) # HBAIC and Lipid profile # Telemetry monitoring # Frequent neuro checks # NPO until passes stroke swallow screen  Please page stroke NP  Or  PA  Or MD from 8am -4 pm   as this patient from this time will be  followed by the stroke.   You can look them up on [email or web address]  Password [facility]   Ferdinand Revoir [facility] Pager Number [phone]

## 2020-03-04 NOTE — ED Notes (Addendum)
Pt transported to xray 

## 2020-03-04 NOTE — ED Notes (Signed)
Added pt's son Charna Busman) to contact demographics. Pt requesting food and drink.  RN requested diet order from admitting MD.

## 2020-03-04 NOTE — Progress Notes (Signed)
  Echocardiogram 2D Echocardiogram has been performed.  Heather Nielsen 03/04/2020, 2:18 PM

## 2020-03-04 NOTE — ED Notes (Signed)
Pt transported to xray 

## 2020-03-04 NOTE — ED Notes (Addendum)
Stroke Swallow Screen performed by previous RN; documentation recorded at 21:48 keeps pt NPO except for sips with meds per diet order. MD stated pt is allowed to eat and drink at this time with passing result. This RN performed stroke swallow screen with a passing result prior to administering aspirin and again before giving food to patient due to time lapse between events and incomplete SLP evaluation.

## 2020-03-04 NOTE — Progress Notes (Signed)
PROGRESS NOTE  Heather Nielsen CWC:[id number] DOB: [birth year] DOA: 03/03/2020 PCP: Patient, No Pcp Per  Brief History   Presented with sudden onset of aphasia right-sided facial droop right-sided weakness last time was seen normal about 12 on 16 March patient was outside of code stroke window. Today there was noted to have a bruise in her head suspect the patient had an unwitnessed fall.  No cough no fever unable to provide history secondary to aphasia  Pt has not been seen by PCP she does not go to the doctors At her baseline she is very clear minded and walks a few miles  a day  She still drinks one drink a day but stopped smoking  The patient has tested negative for COVID-19. She has passed her swallow eval. She is being roomed in the ED pending bed availability upstairs.   CTA head and neck and perfusion of brain demonstrated occlusion of Lt MCA at the origin with infarct of the left basal ganglia. MRI Brain is pending. Echocardiogram is pending. Neurology and SLP have been consulted.  Consultants  . Neurology  Procedures  . None  Antibiotics   Anti-infectives (From admission, onward)   None    .  Subjective  The patient is resting quietly with her son at bedside. She is speaking now, but has significant word-finding difficulties and dysarthria. She understands speech without difficulty. No new complaints other than she wants to eat.  Objective   Vitals:  Vitals:   03/04/20 1230 03/04/20 1245  BP: (!) 149/66 (!) 148/61  Pulse: (!) 59 62  Resp: 14 13  Temp:    SpO2: 97% 98%   Exam:  Constitutional:  . The patient is awake, alert, and oriented x 3. No acute distress. Respiratory:  . No increased work of breathing. . No wheezes, rales, or rhonchi . No tactile fremitus Cardiovascular:  . Regular rate and rhythm . No murmurs, ectopy, or gallups. . No lateral PMI. No thrills. Abdomen:  . Abdomen is soft, non-tender, non-distended . No hernias, masses, or  organomegaly . Normoactive bowel sounds.  Musculoskeletal:  . No cyanosis, clubbing, or edema Skin:  . No rashes, lesions, ulcers . palpation of skin: no induration or nodules Neurologic:  . CN 2-12 intact, except for 7 on the right with facial droop . Weakness on right . Sensation all 4 extremities intact . Significant speech deficits both in word finding and articulation. Psychiatric:  . Mental status o Mood, affect appropriate o Orientation to person, place, time  . judgment and insight appear intact   I have personally reviewed the following:   Today's Data  . Vitals, CMP, lipid panel, CBC  Imaging  . CTA head and neck . CT perfusion of brain . MRI Brain  Scheduled Meds: .  stroke: mapping our early stages of recovery book   Does not apply Once  . aspirin  300 mg Rectal Daily   Or  . aspirin  325 mg Oral Daily  . latanoprost  1 drop Right Eye Daily   Continuous Infusions: . sodium chloride 75 mL/hr at 03/04/20 7616    Active Problems:   CVA (cerebral vascular accident) ([place])   LOS: 1 day   A & P  Acute Left MCA terrritory basal ganglia infarct: Pt [facility] to [facility] with more than 24 hours of left sided weakness, aphasia, and left facial droop. CTA H&N demonstrated occlusion of the left MCA M1. Recommendation is for MRA of head and neck, TTE, ASA  325 daily, Atorvastatin 80 mg daily, permissive approach to hypertension, HBAIC, and lipid profile. She will be monitored on telemetry and have frequent neuro checks. She has now passed a swallow screen and has been started on a heart healthy diet. PT OT will be consulted to assist. Pt will likely require SNF placement.  Pt is without recent contact with the healthcare system.  Hyperlipidemia; LDL 206. Pt has been started on atorvastatin 80 mg daily.  Hypertension: permissive approach to patient's hypertension. Monitor.  ETOH: Pt states that she drinks one drink daily. Will make ativan available for agitation.  I  have seen and examined this patient myself. I have spent 38 minutes in her evaluation and care.  DVT prophylaxis: SCD's CODE STATUS: DNR Family Communication: Son is at bedside. All questions answered to the best of my ability. Disposition: Pt is from home. Anticipate discharge to SNF pending the evaluation on PT/OT.  Timo Hartwig, DO [facility] Direct contact: see [email or web address]  7PM-7AM contact night coverage as above 03/04/2020, 12:59 PM  LOS: 1 day

## 2020-03-05 ENCOUNTER — Inpatient Hospital Stay (HOSPITAL_COMMUNITY): Payer: PPO

## 2020-03-05 ENCOUNTER — Inpatient Hospital Stay (HOSPITAL_COMMUNITY)
Admission: EM | Disposition: A | Discharge status: Home Health | Payer: Self-pay | Source: Home / Self Care | Attending: Internal Medicine

## 2020-03-05 DIAGNOSIS — R4701 Aphasia: Secondary | ICD-10-CM

## 2020-03-05 DIAGNOSIS — I639 Cerebral infarction, unspecified: Secondary | ICD-10-CM

## 2020-03-05 HISTORY — PX: LOOP RECORDER INSERTION: EP1214

## 2020-03-05 LAB — CBC WITH DIFFERENTIAL/PLATELET
Abs Immature Granulocytes: 0.01 10*3/uL (ref 0.00–0.07)
Basophils Absolute: 0 10*3/uL (ref 0.0–0.1)
Basophils Relative: 1 %
Eosinophils Absolute: 0.1 10*3/uL (ref 0.0–0.5)
Eosinophils Relative: 2 %
HCT: 33.4 % — ABNORMAL LOW (ref 36.0–46.0)
Hemoglobin: 11.5 g/dL — ABNORMAL LOW (ref 12.0–15.0)
Immature Granulocytes: 0 %
Lymphocytes Relative: 20 %
Lymphs Abs: 1 10*3/uL (ref 0.7–4.0)
MCH: 31.3 pg (ref 26.0–34.0)
MCHC: 34.4 g/dL (ref 30.0–36.0)
MCV: 90.8 fL (ref 80.0–100.0)
Monocytes Absolute: 0.6 10*3/uL (ref 0.1–1.0)
Monocytes Relative: 12 %
Neutro Abs: 3.3 10*3/uL (ref 1.7–7.7)
Neutrophils Relative %: 65 %
Platelets: 219 10*3/uL (ref 150–400)
RBC: 3.68 MIL/uL — ABNORMAL LOW (ref 3.87–5.11)
RDW: 12.5 % (ref 11.5–15.5)
WBC: 5.1 10*3/uL (ref 4.0–10.5)
nRBC: 0 % (ref 0.0–0.2)

## 2020-03-05 LAB — BASIC METABOLIC PANEL
Anion gap: 12 (ref 5–15)
BUN: 8 mg/dL (ref 8–23)
CO2: 23 mmol/L (ref 22–32)
Calcium: 8.6 mg/dL — ABNORMAL LOW (ref 8.9–10.3)
Chloride: 101 mmol/L (ref 98–111)
Creatinine, Ser: 0.49 mg/dL (ref 0.44–1.00)
GFR calc Af Amer: >60 mL/min (ref >60)
GFR calc non Af Amer: >60 mL/min (ref >60)
Glucose, Bld: 97 mg/dL (ref 70–99)
Potassium: 3.5 mmol/L (ref 3.5–5.1)
Sodium: 136 mmol/L (ref 135–145)

## 2020-03-05 IMAGING — MR MR MRA HEAD W/O CM
3 series · 19 of 48 positions shown · non-contrast
Comparison: CT angiogram of the head and neck [DATE]

CLINICAL DATA: Stroke follow-up

EXAM:
MRA HEAD WITHOUT CONTRAST
TECHNIQUE: Angiographic images of the Circle of Willis were obtained using MRA
technique without intravenous contrast.

[Series 2: loc · axial · 6.0mm · 0.59mm/px · z∈[-68,+153]mm · 5 of 19 slices shown]
[im 1/19]
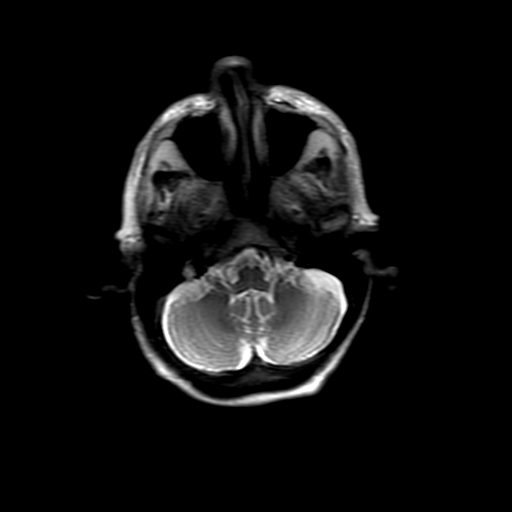
[im 5/19]
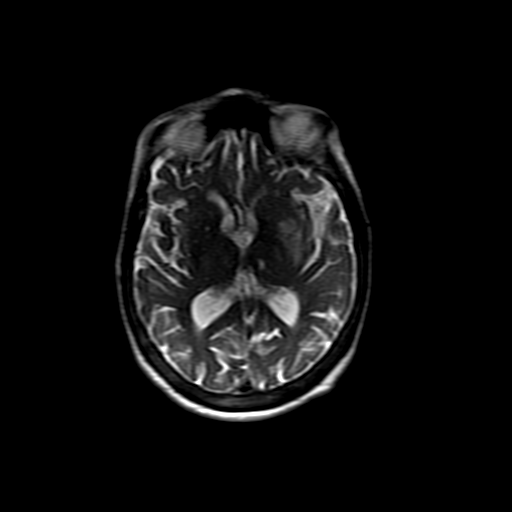
[im 10/19]
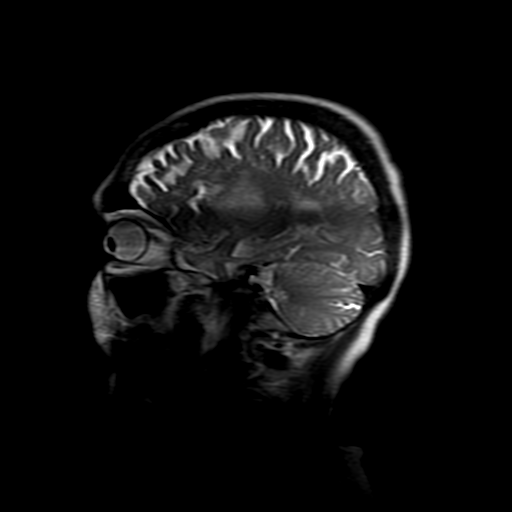
[im 14/19]
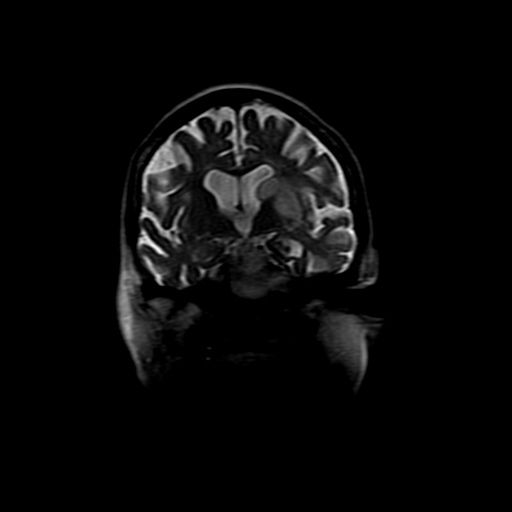
[im 19/19]
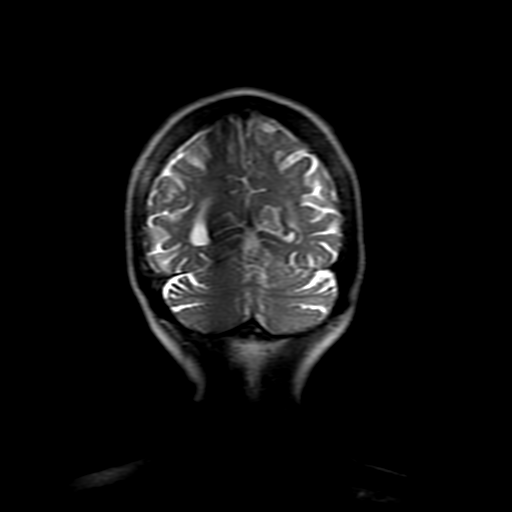

[Series 3: (id) mt fs · axial · 1.4mm · 0.43mm/px · z∈[-86,+2]mm · 13 of 136 slices shown]
[im 1/136]
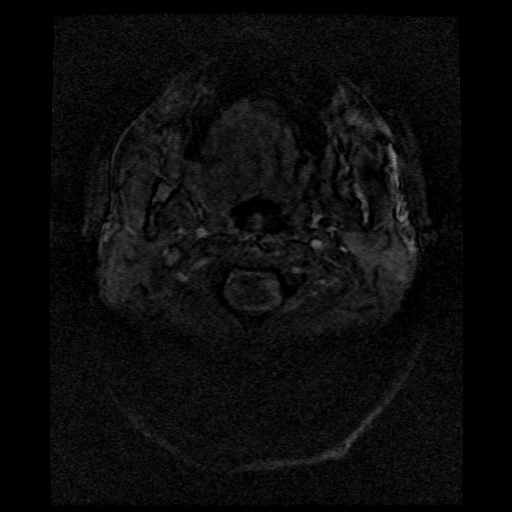
[im 4/136]
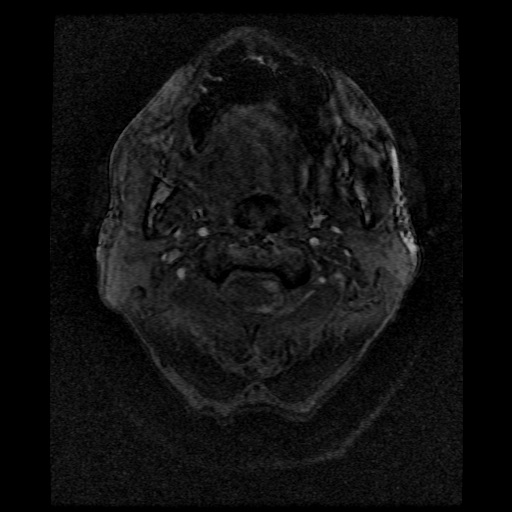
[im 7/136]
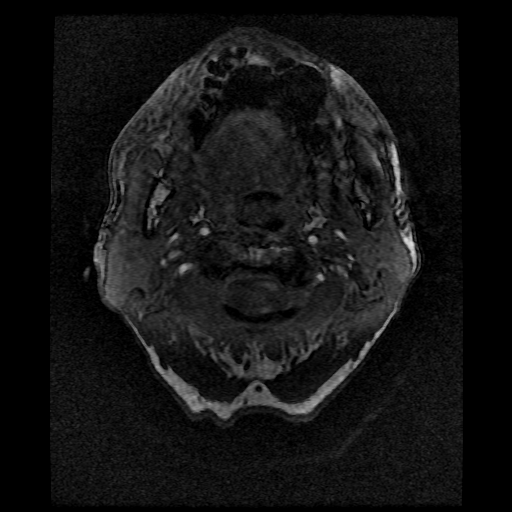
[im 10/136]
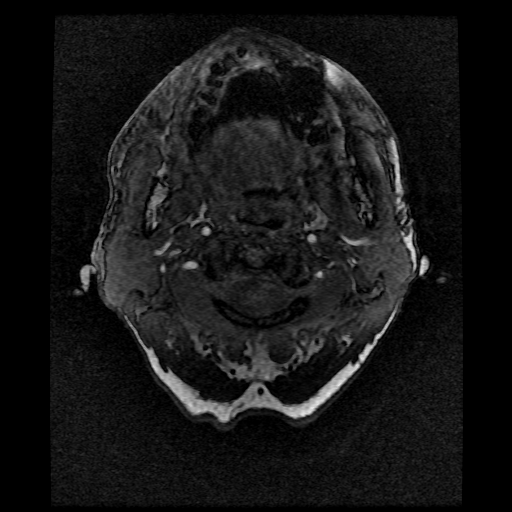
[im 24/136]
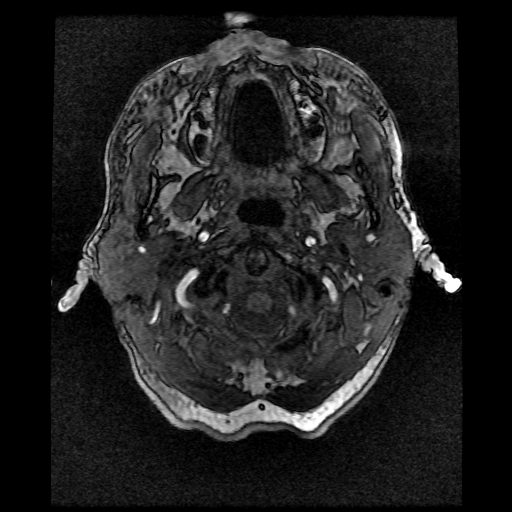
[im 43/136]
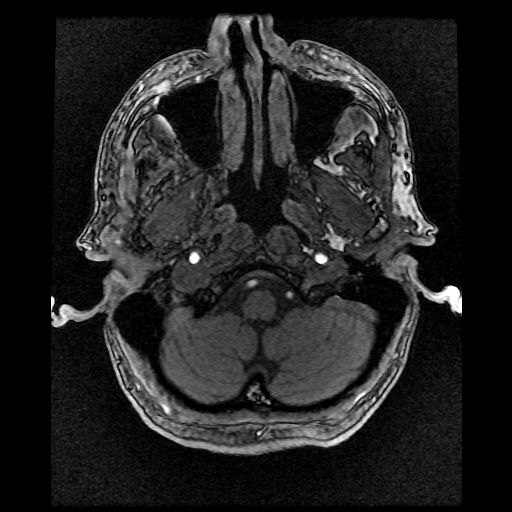
[im 60/136]
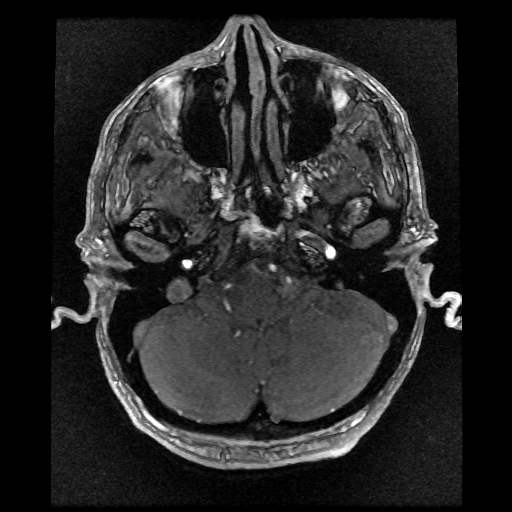
[im 70/136]
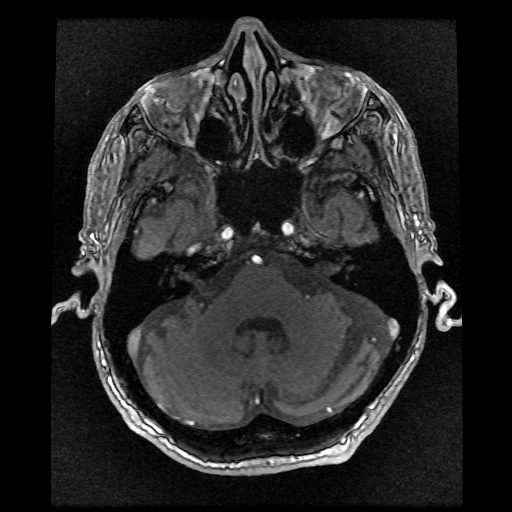
[im 76/136]
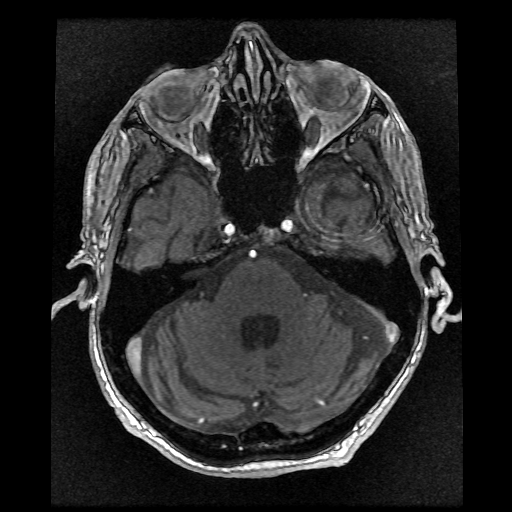
[im 93/136]
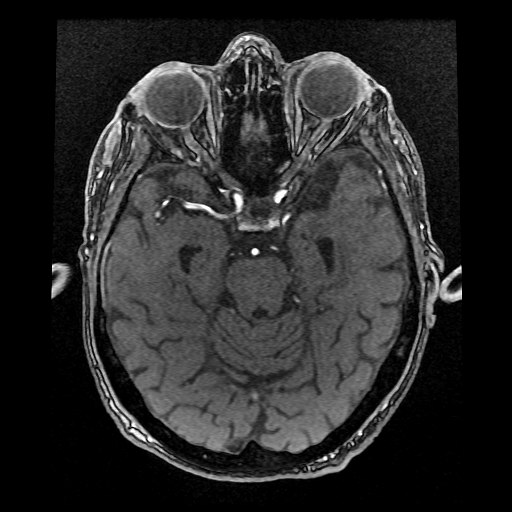
[im 112/136]
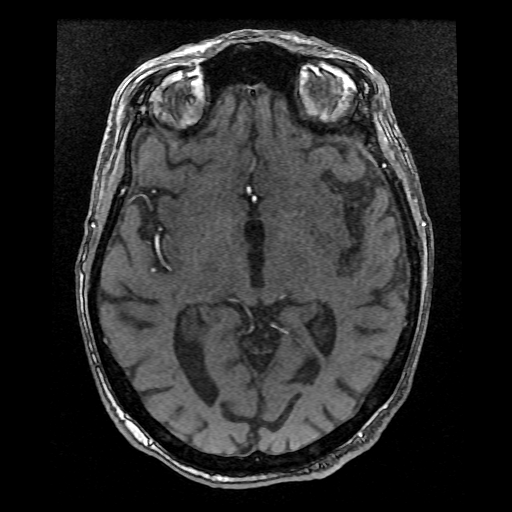
[im 116/136]
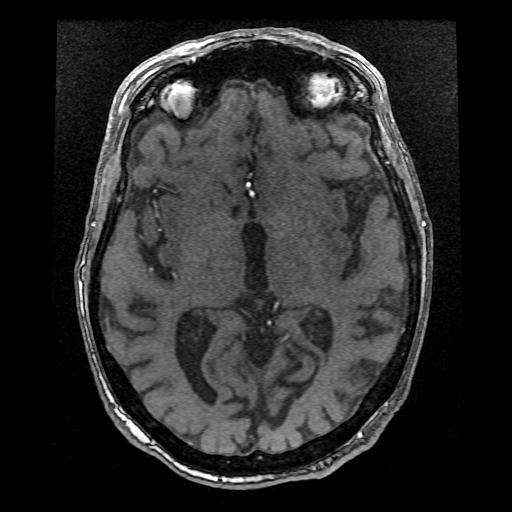
[im 129/136]
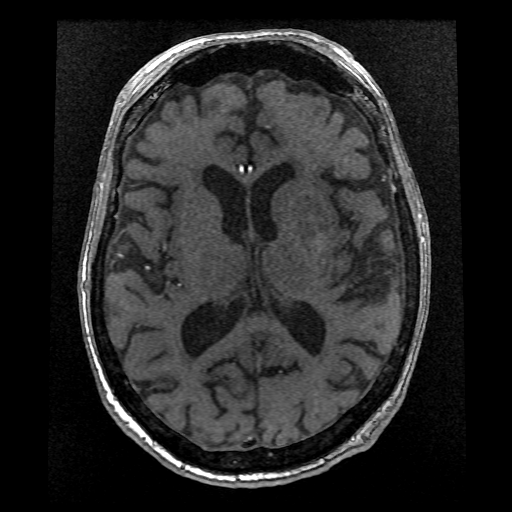

[Series 300: col:(id) mt fs · axial · 1.4mm · 0.43mm/px · 1 of 1 slices shown]
[im 1/1]
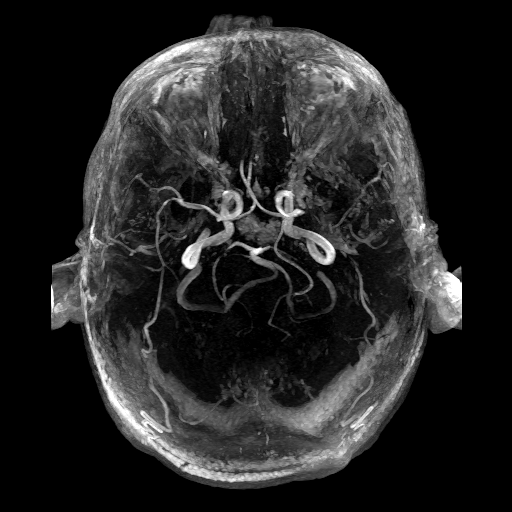

[19 of 48 positions shown; findings below may reference images not displayed]

FINDINGS: The visualized portions of the distal cervical internal carotid
arteries are widely patent with normal flow related enhancement.
Luminal irregularity is noted in the cavernous segment of the
bilateral ICAs, suggestive of atherosclerotic disease, without
hemodynamically significant stenosis.

Persistent occlusion of the proximal left MCA with absent flow
related enhancement in the entire left MCA vascular tree.

The right MCA and bilateral ACA have normal caliber and flow related
enhancement. No intracranial aneurysm within the anterior
circulation.

The vertebral arteries are widely patent with antegrade flow. The
posterior inferior cerebral arteries are normal. Vertebrobasilar
junction and basilar artery are widely patent with antegrade flow
without evidence of basilar stenosis or aneurysm. Moderate stenosis
is again demonstrated at the proximal right P2 segment. The left
posterior cerebral artery originates to likely from the left ICA
(fetal PCA) and appear normal.
IMPRESSION: 1. Persistent occlusion of the proximal left MCA with absent flow
related enhancement in the left MCA vascular tree.
2. Stable moderate stenosis at the proximal right P2 segment.

## 2020-03-05 SURGERY — LOOP RECORDER INSERTION

## 2020-03-05 MED ORDER — ATORVASTATIN CALCIUM 40 MG PO TABS
40.0000 mg | ORAL_TABLET | Freq: Every day | ORAL | Status: DC
  Administered 2020-03-05: 19:00:00 40 mg via ORAL
  Filled 2020-03-05: qty 1

## 2020-03-05 MED ORDER — LORAZEPAM 2 MG/ML IJ SOLN: 0.5000 mg | Freq: Once | INTRAMUSCULAR | Status: DC | PRN

## 2020-03-05 MED ORDER — LIDOCAINE-EPINEPHRINE 1 %-1:100000 IJ SOLN
INTRAMUSCULAR | Status: AC
  Filled 2020-03-05: qty 1

## 2020-03-05 MED ORDER — LISINOPRIL 10 MG PO TABS
10.0000 mg | ORAL_TABLET | Freq: Every day | ORAL | Status: DC
  Administered 2020-03-05 – 2020-03-06 (×2): 10 mg via ORAL
  Filled 2020-03-05 (×2): qty 1

## 2020-03-05 SURGICAL SUPPLY — 2 items
MONITOR REVEAL LINQ II (Prosthesis & Implant Heart) ×1 IMPLANT
PACK LOOP INSERTION (CUSTOM PROCEDURE TRAY) ×2 IMPLANT

## 2020-03-05 NOTE — Progress Notes (Signed)
OT Cancellation Note  Patient Details Name: Heather Nielsen MRN: [id number] DOB: [birth year]   Cancelled Treatment:    Reason Eval/Treat Not Completed: Patient at procedure or test/ unavailable OT attempting and SLP evaluation patient. Second attempt pt off unit to IR. OT to attempt at a later time when appropriate.  Wynona Neat, OTR/L  Acute Rehabilitation Services Pager: [phone] Office: [phone] .  03/05/2020, 11:23 AM

## 2020-03-05 NOTE — Progress Notes (Signed)
STROKE TEAM PROGRESS NOTE   INTERVAL HISTORY Her son and daughter are at the bedside. They have many questions and concerns. I have reviewed the case in great detail with them as well as showed them the MRI findings. I have described that this appears to be cardiomegalic, yet no source clearly found. Suspect Afib given severely dilated left atria. We discussed a halter monitor, but pt is an avid daily swimmer and this would not work. Thus she has opted to do loop recorder. I have d/w Cardiology EP team.   Vitals:   03/05/20 0349 03/05/20 0434 03/05/20 0804 03/05/20 1210  BP: 137/85 (!) 146/66 116/70 (!) 177/65  Pulse: (!) 55 (!) 57 75 61  Resp: 16 15 16 18   Temp: 97.8 F (36.6 C) 97.7 F (36.5 C) 98.4 F (36.9 C) 98.4 F (36.9 C)  TempSrc: Oral Oral Oral Oral  SpO2: 97% 98% 98% 98%    CBC:  Recent Labs  Lab 03/03/20 1854 03/03/20 1854 03/03/20 2030 03/05/20 0443  WBC 10.7*  --   --  5.1  NEUTROABS 9.2*  --   --  3.3  HGB 13.0   < > 12.2 11.5*  HCT 38.7   < > 36.0 33.4*  MCV 92.8  --   --  90.8  PLT 262  --   --  219   < > = values in this interval not displayed.    Basic Metabolic Panel:  Recent Labs  Lab 03/03/20 1854 03/03/20 1854 03/03/20 2030 03/05/20 0443  NA 135   < > 134* 136  K 3.6   < > 3.6 3.5  CL 97*   < > 99 101  CO2 22  --   --  23  GLUCOSE 122*   < > 112* 97  BUN 15   < > 17 8  CREATININE 0.76   < > 0.50 0.49  CALCIUM 9.5  --   --  8.6*   < > = values in this interval not displayed.   Lipid Panel:     Component Value Date/Time   CHOL 332 (H) 03/04/2020 0500   TRIG 59 03/04/2020 0500   HDL 114 03/04/2020 0500   CHOLHDL 2.9 03/04/2020 0500   VLDL 12 03/04/2020 0500   LDLCALC 206 (H) 03/04/2020 0500   HgbA1c:  Lab Results  Component Value Date   HGBA1C 5.8 (H) 03/04/2020   Urine Drug Screen: No results found for: LABOPIA, COCAINSCRNUR, LABBENZ, AMPHETMU, THCU, LABBARB  Alcohol Level     Component Value Date/Time   ETH <10 03/03/2020  1854    IMAGING past 24 hours MR ANGIO HEAD WO CONTRAST  Result Date: 03/05/2020 CLINICAL DATA:  Stroke follow-up EXAM: MRA HEAD WITHOUT CONTRAST TECHNIQUE: Angiographic images of the Circle of Willis were obtained using MRA technique without intravenous contrast. COMPARISON:  CT angiogram of the head and neck March 03, 2020 FINDINGS: The visualized portions of the distal cervical internal carotid arteries are widely patent with normal flow related enhancement. Luminal irregularity is noted in the cavernous segment of the bilateral ICAs, suggestive of atherosclerotic disease, without hemodynamically significant stenosis. Persistent occlusion of the proximal left MCA with absent flow related enhancement in the entire left MCA vascular tree. The right MCA and bilateral ACA have normal caliber and flow related enhancement. No intracranial aneurysm within the anterior circulation. The vertebral arteries are widely patent with antegrade flow. The posterior inferior cerebral arteries are normal. Vertebrobasilar junction and basilar artery are widely patent  with antegrade flow without evidence of basilar stenosis or aneurysm. Moderate stenosis is again demonstrated at the proximal right P2 segment. The left posterior cerebral artery originates to likely from the left ICA (fetal PCA) and appear normal. IMPRESSION: 1. Persistent occlusion of the proximal left MCA with absent flow related enhancement in the left MCA vascular tree. 2. Stable moderate stenosis at the proximal right P2 segment. Electronically Signed   By: Baldemar Lenis M.D.   On: 03/05/2020 12:30    PHYSICAL EXAM General: Appears well-developed  Psych: Affect appropriate to situation Eyes: No scleral injection HENT: No OP obstrucion Head: Normocephalic.  Cardiovascular: Normal rate and regular rhythm. Respiratory: Effort normal and breath sounds normal to anterior ascultation GI: Soft. No distension. There is no tenderness.   Skin: WDI   Neurological Examination Mental Status: Alert, oriented to place and person, incorrectly stated the month, but then self corrected a moment later.  Has difficulty with naming and expression and forming sentences. Slowly follows simple commands, but sometimes seems to issue with correct command, gets mixed up easily.  Cranial Nerves: II: Visual fields grossly normal,  III,IV, VI: ptosis not present, extra-ocular motions intact bilaterally, pupils equal, round, reactive to light and accommodation V,VII: slight right nasolabial fold is asymmetric, facial light touch sensation normal bilaterally VIII: hearing normal bilaterally IX,X: uvula rises symmetrically XI: bilateral shoulder shrug XII: midline tongue extension Motor: Right :  Upper extremity   5/5                                      Left:     Upper extremity   5/5             Lower extremity   5/5                                                  Lower extremity   5/5 Tone and bulk:normal tone throughout; no atrophy noted Sensory: Pinprick and light touch intact throughout, bilaterally Plantars: Right: downgoing                                Left: downgoing Cerebellar: normal finger-to-nose  ASSESSMENT/PLAN Ms. Charmika D Vanblarcom is a 86 y.o. female with history of arthritis presenting with aphasia. The pt is very healthy and active at baseline, swims daily and walks 2 miles. Not a candidate for IR due to low NIH stroke scale, outside 24-hour window.   Stroke: LMCA stroke; appears embolic, but no source found at this time. Cryptogenic stroke. Loop recorder recommended. Cardiology consulted today for this. Pt agreeable after discussion of options.   Code Stroke: ASPECTS 6  CTA head & neck mod stenosis of Rt P2 PCA, LMCA occlusion at orginin  MRI  Scattered L MCA  MRA  Same as CTA findings  2D Echo 60% EF, severely dilated LA  LDL 206  HgbA1c 5.8  Lovenox for VTE prophylaxis    Diet   Diet Heart Room  service appropriate? Yes; Fluid consistency: Thin     none prior to admission, now on ASA 325mg . IF Afib is seen, then Eliquis to be considered. This was also discussed with pt.   Therapy recommendations:  pending rest of rehab evals still  Disposition:  pending  NO Hypertension dx  Home meds:  none  Stable . Long-term BP goal normotensive  Familial Hyperlipidemia  Home meds:none  LDL 206, goal < 70  Add lipitor 40mg  PO daily. She is agreeable to try this. Will not start 80mg  d/t pts age and arthritis. She is currently very active and would not want to stop that d/t side effects.   Continue statin at discharge   NO Diabetes  Other Stroke Risk Factors  Advanced age  Hospital day # 2 I have spent 50min in conversation with pt and family on above assessment, dx, plan, secondary stroke prevention and stroke education. We will sign off. Please call back if needed. Out pt neuro follow up with [facility] in 4-6 weeks.   Keymani Glynn Metzger-Cihelka, ARNP-C, ANVP-BC Pager: [phone]  To contact Stroke Continuity provider, please refer to [email or web address] After hours, contact General Neurology

## 2020-03-05 NOTE — Plan of Care (Signed)

## 2020-03-05 NOTE — Discharge Instructions (Signed)
Wound care instructions Keep incision clean and dry for 3 days. You can remove outer dressing tomorrow. Leave steri-strips (little pieces of tape) on until seen in the office for wound check appointment. Call the office ([phone]) for redness, drainage, swelling, or fever.  

## 2020-03-05 NOTE — Progress Notes (Signed)
PT Cancellation Note  Patient Details Name: Heather Nielsen MRN: [id number] DOB: Apr 17, 1934   Cancelled Treatment:    Reason Eval/Treat Not Completed: (P) Patient at procedure or test/unavailable(Pt off unit this afternoon, will f/u per POC.)   Courtland Coppa Artis Delay 03/05/2020, 4:13 PM  Bonney Leitz , PTA Acute Rehabilitation Services Pager [phone] Office [phone]

## 2020-03-05 NOTE — Progress Notes (Signed)
  Speech Language Pathology Treatment: Cognitive-Linquistic(Aphasia)  Patient Details Name: Heather Nielsen MRN: [id number] DOB: [birth year] Today's Date: 03/05/2020 Time: 1039-1100 SLP Time Calculation (min) (ACUTE ONLY): 21 min  Assessment / Plan / Recommendation Clinical Impression  Pt was seen for aphasia treatment with her daughter and son present. Family reported that the pt's expressive language skills are notably improved compared to that which was demonstrated two days prior. Pt and her family were educated regarding the nature of aphasia, the pt's deficits, ways to support the pt's communication/reduce frustration, the pt's positive prognostic indicators, and her discharge recommendations. All of their questions were answered to their satisfaction and all parties verbalized understanding. Pt was educated regarding compensatory strategies for word retrieval and she uitlized these strategies with min-mod cues during conversation. She demonstrated 75% accuracy with picture description increasing to 100% accuracy for word retrieval. She provided 6-8 items per category in a 1-minute period during divergent naming tasks when min-mod cues were given. SLP will continue to follow pt.    HPI HPI: Pt is an 86 y.o. female with medical history significant of arthitis who presented with sudden onset of aphasia, right-sided facial droop right-sided weakness. MRI of the brain: acute infarct in the left MCA territory that is confluent at the basal ganglia and patchy along the inferior division cortex.       SLP Plan  Continue with current plan of care  Patient needs continued Speech Lanaguage Pathology Services    Recommendations                   Follow up Recommendations: Outpatient SLP SLP Visit Diagnosis: Aphasia (R47.01);Dysarthria and anarthria (R47.1) Plan: Continue with current plan of care       Clessie Karras I. Vear Clock, MS, CCC-SLP Acute Rehabilitation Services Office number  [phone] Pager [phone]                Scheryl Marten 03/05/2020, 12:51 PM

## 2020-03-05 NOTE — Evaluation (Signed)
Speech Language Pathology Evaluation Patient Details Name: Heather Nielsen MRN: [id number] DOB: [birth year] Today's Date: 03/05/2020 Time: 6010-9323 SLP Time Calculation (min) (ACUTE ONLY): 29 min  Problem List:  Patient Active Problem List   Diagnosis Date Noted  . CVA (cerebral vascular accident) (HCC) 03/03/2020  . OA (osteoarthritis) of hip 06/16/2015  . Osteoarthritis of right hip 12/03/2014  . Routine general medical examination at a health care facility 12/03/2014   Past Medical History:  Past Medical History:  Diagnosis Date  . Arthritis   . Complication of anesthesia   . PONV (postoperative nausea and vomiting)   . Urinary incontinence    Past Surgical History:  Past Surgical History:  Procedure Laterality Date  . KNEE SURGERY  2007   menicus   . ROTATOR CUFF REPAIR Right 2006  . TOTAL HIP ARTHROPLASTY Right 06/16/2015   Procedure: RIGHT TOTAL HIP ARTHROPLASTY ANTERIOR APPROACH;  Surgeon: Ollen Gross, MD;  Location: [facility];  Service: Orthopedics;  Laterality: Right;  . TOTAL HIP ARTHROPLASTY Left 09/06/2016   Procedure: LEFT TOTAL HIP ARTHROPLASTY ANTERIOR APPROACH;  Surgeon: Ollen Gross, MD;  Location: [facility];  Service: Orthopedics;  Laterality: Left;   HPI:  Pt is an 86 y.o. female with medical history significant of arthitis who presented with sudden onset of aphasia, right-sided facial droop right-sided weakness. MRI of the brain: acute infarct in the left MCA territory that is confluent at the basal ganglia and patchy along the inferior division cortex.    Assessment / Plan / Recommendation Clinical Impression  Pt presents with mild to moderate non-fluent aphasia characterized by impairments in receptive and expressive language with more notable difficulty with verbal expression. She was able to participate in conversation but sentence formulation was slow and she exhibited difficulty with word retrieval. Pt was able to follow 2-step commands and respond  appropriately to simple and complex yes/no questions but demonstrated increased difficulty following with more complex directions and at the paragraph level. Articulatory precision was mildly reduced but pt's family reported that her speech is currently at baseline and that she may just be fatigued. Her cognitive-linguistic skills appeared functional but repetition was needed for accuracy during executive function tasks and the possibility of this being related to her language impairment is considered. Skilled SLP services are clinically indicated at this time to improve aphasia.     SLP Assessment  SLP Recommendation/Assessment: Patient needs continued Speech Lanaguage Pathology Services SLP Visit Diagnosis: Aphasia (R47.01);Dysarthria and anarthria (R47.1)    Follow Up Recommendations  Outpatient SLP    Frequency and Duration min 2x/week  2 weeks      SLP Evaluation Cognition  Overall Cognitive Status: Within Functional Limits for tasks assessed Orientation Level: Oriented X4 Attention: Focused;Sustained Focused Attention: Appears intact Sustained Attention: Appears intact Memory: Appears intact(Immediate: 3/3; delayed: 2/3; with cue:1/1) Awareness: Appears intact Problem Solving: Appears intact       Comprehension  Auditory Comprehension Overall Auditory Comprehension: Impaired Yes/No Questions: Impaired Basic Immediate Environment Questions: (5/5) Complex Questions: (5/5) Paragraph Comprehension (via yes/no questions): (2/4) Commands: Impaired Two Step Basic Commands: (4/4) Multistep Basic Commands: (1/3) Conversation: Simple Reading Comprehension Reading Status: Impaired Word level: Within functional limits Sentence Level: Impaired(3/4) Paragraph Level: (5/5)    Expression Expression Primary Mode of Expression: Verbal Verbal Expression Overall Verbal Expression: Impaired Initiation: Impaired Automatic Speech: Day of week;Month of year(WNL) Level of  Generative/Spontaneous Verbalization: Sentence;Conversation Repetition: No impairment Naming: Impairment Responsive: (3/5) Confrontation: (8/10) Convergent: (5/5) Pragmatics: No impairment Interfering Components: Attention  Oral / Surveyor, quantity Overall Motor Speech: Appears within functional limits for tasks assessed Respiration: Within functional limits Phonation: Normal Resonance: Within functional limits Articulation: Impaired Level of Impairment: Sentence Intelligibility: Intelligible Motor Planning: Witnin functional limits Motor Speech Errors: Consistent   Heather Nielsen, [place], [place] Office number [phone] Pager [place] 03/05/2020, 12:30 PM

## 2020-03-05 NOTE — Consult Note (Addendum)
ELECTROPHYSIOLOGY CONSULT NOTE  Patient ID: Heather Nielsen MRN: [id number], DOB/AGE: [birth year]   Admit date: 03/03/2020 Date of Consult: 03/05/2020  Primary Physician: Patient, No Pcp Per Primary Cardiologist: Dr. Antoine Poche (once in 2017) Reason for Consultation: Cryptogenic stroke ; recommendations regarding Implantable Loop Recorder  History of Present Illness Heather Nielsen was admitted on 03/03/2020 with stroke.    PMHx of arthritis and HLD (chart mentions does not see doctors routinely)  Neurology noted LMCA stroke; appears embolic, but no source found at this time.  she has undergone workup for stroke including echocardiogram and carotid angio.  The patient has been monitored on telemetry which has demonstrated sinus rhythm with no arrhythmias.  Neurology has deferred TEE   Echocardiogram  IMPRESSIONS  1. Left ventricular ejection fraction, by estimation, is 60 to 65%. The  left ventricle has normal function. The left ventricle has no regional  wall motion abnormalities. Left ventricular diastolic parameters are  consistent with Grade II diastolic  dysfunction (pseudonormalization). Elevated left atrial pressure.  2. Right ventricular systolic function is normal. The right ventricular  size is normal. There is normal pulmonary artery systolic pressure.  3. Left atrial size was severely dilated.  4. The mitral valve is grossly normal. Mild mitral valve regurgitation.  No evidence of mitral stenosis.  5. The aortic valve is tricuspid. Aortic valve regurgitation is mild.  Mild aortic valve sclerosis is present, with no evidence of aortic valve  stenosis.  6. The inferior vena cava is normal in size with greater than 50%  respiratory variability, suggesting right atrial pressure of 3 mmHg.     Lab work is reviewed.  The patient is very active for her age, walks and swims very regularly for exercise with no real health problems until now.  Prior to admission, she  denies chest pain, shortness of breath, dizziness, palpitations, or syncope.  She is recovering from her stroke with some remaining aphasia, with plans to home at discharge.    Past Medical History:  Diagnosis Date   Arthritis    Complication of anesthesia    PONV (postoperative nausea and vomiting)    Urinary incontinence      Surgical History:  Past Surgical History:  Procedure Laterality Date   KNEE SURGERY  2007   [place]    ROTATOR CUFF REPAIR Right 2006   TOTAL HIP ARTHROPLASTY Right 06/16/2015   Procedure: RIGHT TOTAL HIP ARTHROPLASTY ANTERIOR APPROACH;  Surgeon: Ollen Gross, MD;  Location: [facility];  Service: Orthopedics;  Laterality: Right;   TOTAL HIP ARTHROPLASTY Left 09/06/2016   Procedure: LEFT TOTAL HIP ARTHROPLASTY ANTERIOR APPROACH;  Surgeon: Ollen Gross, MD;  Location: [facility];  Service: Orthopedics;  Laterality: Left;     Medications Prior to Admission  Medication Sig Dispense Refill Last Dose   latanoprost (XALATAN) 0.005 % ophthalmic solution Place 1 drop into the right eye daily.   03/03/2020 at Unknown time   methocarbamol (ROBAXIN) 500 MG tablet Take 1 tablet (500 mg total) by mouth every 6 (six) hours as needed for muscle spasms. (Patient not taking: Reported on 03/03/2020) 80 tablet 0 Not Taking at Unknown time   ondansetron (ZOFRAN) 4 MG tablet Take 1 tablet (4 mg total) by mouth every 6 (six) hours as needed for nausea. (Patient not taking: Reported on 10/17/2016) 40 tablet 0 Not Taking at Unknown time   oxyCODONE (OXY IR/ROXICODONE) 5 MG immediate release tablet Take 1-2 tablets (5-10 mg total) by mouth every 3 (three) hours as  needed for breakthrough pain. (Patient not taking: Reported on 10/17/2016) 80 tablet 0 Not Taking at Unknown time   rivaroxaban (XARELTO) 10 MG TABS tablet Take 1 tablet (10 mg total) by mouth daily with breakfast. Take Xarelto for two and a half more weeks, then discontinue Xarelto. Once the patient has completed the blood  thinner regimen, then take a Baby 81 mg Aspirin daily for three more weeks. (Patient not taking: Reported on 03/03/2020) 20 tablet 0 Not Taking at Unknown time   traMADol (ULTRAM) 50 MG tablet Take 1-2 tablets (50-100 mg total) by mouth every 6 (six) hours as needed for moderate pain. Take Xarelto for two and a half more weeks, then discontinue Xarelto. Once the patient has completed the blood thinner regimen, then take a Baby 81 mg Aspirin daily for three more weeks. (Patient not taking: Reported on 03/03/2020) 80 tablet 1 Not Taking at Unknown time    Inpatient Medications:    stroke: mapping our early stages of recovery book   Does not apply Once   aspirin  325 mg Oral Daily   atorvastatin  40 mg Oral q1800   latanoprost  1 drop Right Eye Daily   lisinopril  10 mg Oral Daily    Allergies:  Allergies  Allergen Reactions   Penicillins Other (See Comments)    Has patient had a PCN reaction causing immediate rash, facial/tongue/throat swelling, SOB or lightheadedness with hypotension:No Has patient had a PCN reaction causing severe rash involving mucus membranes or skin necrosis:No Has patient had a PCN reaction that required hospitalization:No Has patient had a PCN reaction occurring within the last 10 years:No If all of the above answers are "NO", then may proceed with Cephalosporin use. Urinary issues--pt.says "felt like razor blades in her bladder"   Pneumococcal Vaccines Hives, Itching and Rash    Social History   Socioeconomic History   Marital status: Widowed    Spouse name: Not on file   Number of children: 5   Years of education: Not on file   Highest education level: Not on file  Occupational History   Not on file  Tobacco Use   Smoking status: Former Smoker    Packs/day: 0.25    Years: 25.00    Pack years: 6.25    Types: Cigarettes    Quit date: 12/18/1984    Years since quitting: 35.2   Smokeless tobacco: Never Used  Substance and Sexual Activity    Alcohol use: Yes    Alcohol/week: 0.0 standard drinks    Comment: gin and tonic and glass of wine daily    Drug use: No   Sexual activity: Not on file  Other Topics Concern   Not on file  Social History Narrative   Lives with husband who has a memory disorder.    Social Determinants of Health   Financial Resource Strain:    Difficulty of Paying Living Expenses:   Food Insecurity:    Worried About Programme researcher, broadcasting/film/video in the Last Year:    Barista in the Last Year:   Transportation Needs:    Freight forwarder (Medical):    Lack of Transportation (Non-Medical):   Physical Activity:    Days of Exercise per Week:    Minutes of Exercise per Session:   Stress:    Feeling of Stress :   Social Connections:    Frequency of Communication with Friends and Family:    Frequency of Social Gatherings with Friends and Family:  Attends Religious Services:    Active Member of Clubs or Organizations:    Attends Music therapist:    Marital Status:   Intimate Partner Violence:    Fear of Current or Ex-Partner:    Emotionally Abused:    Physically Abused:    Sexually Abused:      Family History  Problem Relation Age of Onset   Heart disease Mother        died age 56   CAD Brother 70      Review of Systems: All other systems reviewed and are otherwise negative except as noted above.  Physical Exam: Vitals:   03/05/20 0349 03/05/20 0434 03/05/20 0804 03/05/20 1210  BP: 137/85 (!) 146/66 116/70 (!) 177/65  Pulse: (!) 55 (!) 57 75 61  Resp: 16 15 16 18   Temp: 97.8 F (36.6 C) 97.7 F (36.5 C) 98.4 F (36.9 C) 98.4 F (36.9 C)  TempSrc: Oral Oral Oral Oral  SpO2: 97% 98% 98% 98%     GEN- The patient is well appearing, alert and oriented x 3 today.   Head- normocephalic, atraumatic Eyes-  Sclera clear, conjunctiva pink Ears- hearing intact Oropharynx- clear Neck- supple Lungs- CTA b/l, normal work of breathing Heart- RRR,  no murmurs, rubs or gallops  GI- soft, NT, ND Extremities- no clubbing, cyanosis, or edema MS- no significant deformity or atrophy Skin- no rash or lesion Psych- euthymic mood, full affect   Labs:   Lab Results  Component Value Date   WBC 5.1 03/05/2020   HGB 11.5 (L) 03/05/2020   HCT 33.4 (L) 03/05/2020   MCV 90.8 03/05/2020   PLT 219 03/05/2020    Recent Labs  Lab 03/03/20 1854 03/03/20 2030 03/05/20 0443  NA 135   < > 136  K 3.6   < > 3.5  CL 97*   < > 101  CO2 22   < > 23  BUN 15   < > 8  CREATININE 0.76   < > 0.49  CALCIUM 9.5   < > 8.6*  PROT 6.8  --   --   BILITOT 0.7  --   --   ALKPHOS 47  --   --   ALT 17  --   --   AST 39  --   --   GLUCOSE 122*   < > 97   < > = values in this interval not displayed.   No results found for: CKTOTAL, CKMB, CKMBINDEX, TROPONINI Lab Results  Component Value Date   CHOL 332 (H) 03/04/2020   CHOL 281 (H) 12/03/2014   Lab Results  Component Value Date   HDL 114 03/04/2020   HDL 91.70 12/03/2014   Lab Results  Component Value Date   LDLCALC 206 (H) 03/04/2020   LDLCALC 178 (H) 12/03/2014   Lab Results  Component Value Date   TRIG 59 03/04/2020   TRIG 59.0 12/03/2014   Lab Results  Component Value Date   CHOLHDL 2.9 03/04/2020   CHOLHDL 3 12/03/2014   No results found for: LDLDIRECT  No results found for: DDIMER   Radiology/Studies:   CT Angio head W or Wo Contrast Result Date: 03/03/2020 CLINICAL DATA:  Sudden onset aphasia and right-sided facial droop. Right-sided weakness. EXAM: CT ANGIOGRAPHY HEAD AND NECK TECHNIQUE: Multidetector CT imaging of the head and neck was performed using the standard protocol during bolus administration of intravenous contrast. Multiplanar CT image reconstructions and MIPs were obtained to evaluate the vascular  anatomy. Carotid stenosis measurements (when applicable) are obtained utilizing NASCET criteria, using the distal internal carotid diameter as the denominator. CONTRAST:   OMNIPAQUE IOHEXOL 350 MG/ML SOLN COMPARISON:  None. FINDINGS: CT HEAD FINDINGS Brain: There is no mass, hemorrhage or extra-axial collection. The size and configuration of the ventricles and extra-axial CSF spaces are normal. There is hypoattenuation within the left basal ganglia and left frontal white matter. There is hypoattenuation of the periventricular white matter, most commonly indicating chronic ischemic microangiopathy. Skull: The visualized skull base, calvarium and extracranial soft tissues are normal. Sinuses/Orbits: No fluid levels or advanced mucosal thickening of the visualized paranasal sinuses. No mastoid or middle ear effusion. The orbits are normal. ASPECTS [facility] Stroke Program Early CT Score) - Ganglionic level infarction (caudate, lentiform nuclei, internal capsule, insula, M1-M3 cortex): 4 - Supraganglionic infarction (M4-M6 cortex): 2 Total score (0-10 with 10 being normal): 6 Review of the MIP images confirms the above findings CTA NECK FINDINGS SKELETON: There is no bony spinal canal stenosis. No lytic or blastic lesion. OTHER NECK: Normal pharynx, larynx and major salivary glands. No cervical lymphadenopathy. Unremarkable thyroid gland. UPPER CHEST: No pneumothorax or pleural effusion. No nodules or masses. AORTIC ARCH: There is mild calcific atherosclerosis of the aortic arch. There is no aneurysm, dissection or hemodynamically significant stenosis of the visualized portion of the aorta. Conventional 3 vessel aortic branching pattern. The visualized proximal subclavian arteries are widely patent. RIGHT CAROTID SYSTEM: Normal without aneurysm, dissection or stenosis. LEFT CAROTID SYSTEM: No dissection, occlusion or aneurysm. There is mixed density atherosclerosis extending into the proximal ICA, resulting in less than 50% stenosis. VERTEBRAL ARTERIES: Left dominant configuration. Mild narrowing of both vertebral artery origins. There is no dissection, occlusion or flow-limiting  stenosis to the skull base (V1-V3 segments). CTA HEAD FINDINGS POSTERIOR CIRCULATION: --Vertebral arteries: Normal V4 segments. --Posterior inferior cerebellar arteries (PICA): Patent origins from the vertebral arteries. --Anterior inferior cerebellar arteries (AICA): Patent origins from the basilar artery. --Basilar artery: Normal. --Superior cerebellar arteries: Normal. --Posterior cerebral arteries: Moderate stenosis of the P2 segment of the right PCA. Fetal origin of the left. ANTERIOR CIRCULATION: --Intracranial internal carotid arteries: Atherosclerotic calcification of the internal carotid arteries at the skull base without hemodynamically significant stenosis. --Anterior cerebral arteries (ACA): Normal. Both A1 segments are present. Patent anterior communicating artery (a-comm). --Middle cerebral arteries (MCA): The left middle cerebral artery is occluded at its origin. The right MCA is widely patent. VENOUS SINUSES: As permitted by contrast timing, patent. ANATOMIC VARIANTS: Fetal origin of the left posterior cerebral artery. Review of the MIP images confirms the above findings. IMPRESSION: 1. 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 2. Moderate stenosis of the P2 segment of the right posterior cerebral artery. 3. Hypoattenuation within the anterior left MCA territory and left basal ganglia, consistent with early subacute infarct. ASPECTS is 6. 4. No hemorrhage or mass effect. Critical Value/emergent results were called by telephone at the time of interpretation on 03/03/2020 at 9:45 pm to provider [facility] , who verbally acknowledged these results. Electronically Signed   By: Deatra Robinson M.D.   On: 03/03/2020 22:02    DG Chest 2 View Result Date: 03/04/2020 CLINICAL DATA:  Stroke-like symptoms, sudden onset of aphasia EXAM: CHEST - 2 VIEW COMPARISON:  None. FINDINGS: Slight hyperinflation along with coarse reticular opacity in the bases favor chronic interstitial change. No  consolidation, features of edema, pneumothorax, or effusion. The aorta is calcified. The remaining cardiomediastinal contours are unremarkable. No  acute osseous or soft tissue abnormality. Degenerative changes are present in the imaged spine and shoulders. Telemetry leads overlie the chest. IMPRESSION: Slight hyperinflation and likely chronic interstitial change. No No acute cardiopulmonary abnormality. Aortic Atherosclerosis (ICD10-I70.0). Electronically Signed   By: Price  DeHay M.D.   On: 03/Kreg Shropshire18/2021 04:09      MR ANGIO HEAD WO CONTRAST Result Date: 03/05/2020 CLINICAL DATA:  Stroke follow-up EXAM: MRA HEAD WITHOUT CONTRAST TECHNIQUE: Angiographic images of the Circle of Willis were obtained using MRA technique without intravenous contrast. COMPARISON:  CT angiogram of the head and neck March 03, 2020 FINDINGS: The visualized portions of the distal cervical internal carotid arteries are widely patent with normal flow related enhancement. Luminal irregularity is noted in the cavernous segment of the bilateral ICAs, suggestive of atherosclerotic disease, without hemodynamically significant stenosis. Persistent occlusion of the proximal left MCA with absent flow related enhancement in the entire left MCA vascular tree. The right MCA and bilateral ACA have normal caliber and flow related enhancement. No intracranial aneurysm within the anterior circulation. The vertebral arteries are widely patent with antegrade flow. The posterior inferior cerebral arteries are normal. Vertebrobasilar junction and basilar artery are widely patent with antegrade flow without evidence of basilar stenosis or aneurysm. Moderate stenosis is again demonstrated at the proximal right P2 segment. The left posterior cerebral artery originates to likely from the left ICA (fetal PCA) and appear normal. IMPRESSION: 1. Persistent occlusion of the proximal left MCA with absent flow related enhancement in the left MCA vascular tree. 2. Stable  moderate stenosis at the proximal right P2 segment. Electronically Signed   By: Baldemar LenisKatyucia  De Macedo Rodrigues M.D.   On: 03/05/2020 12:30     MR BRAIN WO CONTRAST Result Date: 03/04/2020 CLINICAL DATA:  Sudden onset aphasia and right-sided facial droop EXAM: MRI HEAD WITHOUT CONTRAST TECHNIQUE: Multiplanar, multiecho pulse sequences of the brain and surrounding structures were obtained without intravenous contrast. COMPARISON:  CT and CTA from yesterday FINDINGS: Brain: Acute infarct that is confluent at the basal ganglia and patchy in the superior temporal, insular, and lateral frontal cortex. No contralateral or posterior circulation acute infarcts. No acute hemorrhage. No hydrocephalus, collection, or masslike finding. Small remote left paramedian superior cerebellar infarct. Mild patchy chronic small vessel ischemia in the cerebral white matter. Remote lacunar infarct in the left thalamus. Generalized atrophy that is moderate. Vascular: Poor visualization of left M2 flow voids, stable from preceding CTA. Skull and upper cervical spine: Negative for marrow lesion. There is degenerative facet and endplate spurring. Sinuses/Orbits: Negative IMPRESSION: 1. Acute infarct in the left MCA territory that is confluent at the basal ganglia and patchy along the inferior division cortex. No apparent progression compared to CT and CTA yesterday. 2. Background of atrophy and chronic small vessel ischemic injury. Electronically Signed   By: Marnee SpringJonathon  Watts M.D.   On: 03/04/2020 10:23     CT CEREBRAL PERFUSION W CONTRAST Result Date: 03/03/2020 CLINICAL DATA:  Left MCA occlusion EXAM: CT PERFUSION BRAIN TECHNIQUE: Multiphase CT imaging of the brain was performed following IV bolus contrast injection. Subsequent parametric perfusion maps were calculated using RAPID software. CONTRAST:  40mL OMNIPAQUE IOHEXOL 350 MG/ML SOLN COMPARISON:  None. FINDINGS: CT Brain Perfusion Findings: CBF (<30%) Volume: 5mL Perfusion  (Tmax>6.0s) volume: 99mL Mismatch Volume: 94mL ASPECTS on noncontrast CT Head: 6 at 9:04 p.m. today. Infarct Core: Based on the noncontrast head CT, the infarct core is larger than the 5 mL calculated by CBF. Qualitatively, the infarct core is  probably approximately half of the ischemic volume calculated by Tmax>6.0s, i.e. 50 mL. Infarction Location:Left MCA territory, predominantly the insula and left basal ganglia. IMPRESSION: 1. Underestimation of infarct core by cerebral blood flow criteria. Actual core volume is probably closer to 50 mL. 2. Over estimation of ischemic penumbra, with actual penumbra volume probably on the order 40-50 mL. Electronically Signed   By: Deatra Robinson M.D.   On: 03/03/2020 23:46     12-lead ECG SR All prior EKG's in EPIC reviewed with no documented atrial fibrillation  Telemetry SR   Assessment and Plan:  1. Cryptogenic stroke The patient presents with cryptogenic stroke.  I spoke at length with the patient and her daughter at bedside about monitoring for afib with either a 30 day event monitor or an implantable loop recorder.  Risks, benefits, and alteratives to implantable loop recorder were discussed with the patient today.   At this time, the patient is very clear her their decision to proceed with implantable loop recorder.   Wound care was reviewed with the patient (keep incision clean and dry for 3 days).  Wound check will be scheduled for the patient  Please call with questions.   Sheilah Pigeon, PA-C 03/05/2020   I have seen, examined the patient, and reviewed the above assessment and plan.  Changes to above are made where necessary.  On exam, RRR.  Pt with cryptogenic stroke.  I agree with Neurology that long term monitoring is indicated to evaluate for afib as the cause.  Risks and benefits of ILR discussed with the patient who wishes to proceed.  Co Sign: Hillis Range, MD 03/05/2020 4:25 PM

## 2020-03-05 NOTE — Progress Notes (Signed)
PROGRESS NOTE  INA SCRIVENS QPY:[id number] DOB: 1934/04/11 DOA: 03/03/2020 PCP: Patient, No Pcp Per  Brief History   Presented with sudden onset of aphasia right-sided facial droop right-sided weakness last time was seen normal about 12 on 16 March patient was outside of code stroke window. Today there was noted to have a bruise in her head suspect the patient had an unwitnessed fall.  No cough no fever unable to provide history secondary to aphasia  Pt has not been seen by PCP she does not go to the doctors At her baseline she is very clear minded and walks a few miles  a day  She still drinks one drink a day but stopped smoking  The patient has tested negative for COVID-19. She has passed her swallow eval. She is being roomed in the ED pending bed availability upstairs.   CTA head and neck and perfusion of brain demonstrated occlusion of Lt MCA at the origin with infarct of the left basal ganglia. MRI Brain is pending. Echocardiogram is pending. Neurology and SLP have been consulted. Neurology has recommeded MRA head and neck. This is pending. SLP has recommended ongoing therapy.  Consultants  . Neurology  Procedures  . None  Antibiotics   Anti-infectives (From admission, onward)   None     Subjective  The patient is resting quietly with her daughter at bedside. No new complaints.  Objective   Vitals:  Vitals:   03/05/20 0804 03/05/20 1210  BP: 116/70 (!) 177/65  Pulse: 75 61  Resp: 16 18  Temp: 98.4 F (36.9 C) 98.4 F (36.9 C)  SpO2: 98% 98%   Exam:  Constitutional:  . The patient is awake, alert, and oriented x 3. No acute distress. Respiratory:  . No increased work of breathing. . No wheezes, rales, or rhonchi . No tactile fremitus Cardiovascular:  . Regular rate and rhythm . No murmurs, ectopy, or gallups. . No lateral PMI. No thrills. Abdomen:  . Abdomen is soft, non-tender, non-distended . No hernias, masses, or organomegaly . Normoactive bowel  sounds.  Musculoskeletal:  . No cyanosis, clubbing, or edema Skin:  . No rashes, lesions, ulcers . palpation of skin: no induration or nodules Neurologic:  . CN 2-12 intact, except for 7 on the right with facial droop . Weakness on right . Sensation all 4 extremities intact . Significant speech deficits both in word finding and articulation. Psychiatric:  . Mental status o Mood, affect appropriate o Orientation to person, place, time  . judgment and insight appear intact  I have personally reviewed the following:   Today's Data  . Vitals, CMP, CBC  Imaging  . CTA head and neck . CT perfusion of brain . MRI Brain  Echocardiogram  Scheduled Meds: .  stroke: mapping our early stages of recovery book   Does not apply Once  . aspirin  325 mg Oral Daily  . atorvastatin  40 mg Oral q1800  . latanoprost  1 drop Right Eye Daily   Continuous Infusions:   Active Problems:   CVA (cerebral vascular accident) (HCC)   LOS: 2 days   A & P  Acute Left MCA terrritory basal ganglia infarct: Pt [facility] to [facility] with more than 24 hours of left sided weakness, aphasia, and left facial droop. CTA H&N demonstrated occlusion of the left MCA M1. Recommendation is for MRA of head and neck, TTE, ASA 325 daily, Atorvastatin 80 mg daily, permissive approach to hypertension, HBAIC, and lipid profile. She will be  monitored on telemetry and have frequent neuro checks. She has now passed a swallow screen and has been started on a heart healthy diet. PT OT will be consulted to assist. Pt will likely require SNF placement.  Pt is without recent contact with the healthcare system.  Hyperlipidemia; LDL 206. Pt has been started on atorvastatin 80 mg daily.  Hypertension: permissive approach to patient's hypertension. Monitor. Somewhat labile. I have added lisinopril.  ETOH: Pt states that she drinks one drink daily. Will make ativan available for agitation.  I have seen and examined this patient  myself. I have spent 30 minutes in her evaluation and care.  DVT prophylaxis: SCD's CODE STATUS: DNR Family Communication: Grandaughter is at bedside. All questions answered to the best of my ability. Disposition: Pt is from home. Anticipate discharge to SNF pending the evaluation on PT/OT.  Candra Wegner, DO [facility] Direct contact: see [email or web address]  7PM-7AM contact night coverage as above 03/05/2020, 2:19 PM  LOS: 1 day

## 2020-03-05 NOTE — Progress Notes (Signed)
[place] updated about pt's MEWS yellow d/t HR 114, pt asymptomatic.

## 2020-03-06 DIAGNOSIS — R4701 Aphasia: Secondary | ICD-10-CM

## 2020-03-06 MED ORDER — ASPIRIN 325 MG PO TABS: 325.0000 mg | ORAL_TABLET | Freq: Every day | ORAL | 0 refills | Status: DC

## 2020-03-06 MED ORDER — LISINOPRIL 10 MG PO TABS: 10.0000 mg | ORAL_TABLET | Freq: Every day | ORAL | 0 refills | Status: DC

## 2020-03-06 MED ORDER — ATORVASTATIN CALCIUM 40 MG PO TABS: 40.0000 mg | ORAL_TABLET | Freq: Every day | ORAL | 0 refills | Status: DC

## 2020-03-06 MED ORDER — CLOPIDOGREL BISULFATE 75 MG PO TABS: 75.0000 mg | ORAL_TABLET | Freq: Every day | ORAL | 0 refills | Status: DC

## 2020-03-06 MED ORDER — STROKE: EARLY STAGES OF RECOVERY BOOK: 1.0000 | Freq: Once | 0 refills | Status: AC

## 2020-03-06 NOTE — Progress Notes (Signed)
Pt discharged, AVS given education provided to pt and daughter, PIV removed, telemetry discontinue. Pt transported to [facility] by [place], [place].

## 2020-03-06 NOTE — Progress Notes (Signed)
Physical Therapy Treatment Patient Details Name: Heather Nielsen MRN: [id number] DOB: [birth year] Today's Date: 03/06/2020    History of Present Illness Pt is an 86 y/o female admitted secondary to dysarthria and R sided weakness. Found to have L MCA infarct. PMH includes bilateral THA.     PT Comments    Pt is progressing well towards goals and expected to d/c today. Feel pt would benefit from HHPT and 24/7 supervision at d/c given pt's current balance deficits, decreased safety awareness, and weakness. D/c recommendations updated and discussed with PT. Pt was able to perform sit<>stand 3 ways for neuromuscular re-ed and ambulated with both HHA and RW. Pt does appear more stable on RW and equipment rec updated to include RW. Spoke with daughter and she is aware and agreeable to changes in d/c plans.    Follow Up Recommendations  Home health PT;Supervision/Assistance - 24 hour(TBD)     Equipment Recommendations  Rolling walker with 5" wheels    Recommendations for Other Services       Precautions / Restrictions Precautions Precautions: Fall Restrictions Weight Bearing Restrictions: No    Mobility  Bed Mobility Overal bed mobility: Needs Assistance Bed Mobility: Supine to Sit     Supine to sit: Min guard     General bed mobility comments: Min guard for safety throughout bed mobility tasks.   Transfers Overall transfer level: Needs assistance Equipment used: Rolling walker (2 wheeled) Transfers: Sit to/from Stand Sit to Stand: Min guard         General transfer comment: Min guard for steadying assist to stand.   Ambulation/Gait Ambulation/Gait assistance: Min guard;Min assist Gait Distance (Feet): 75 Feet Assistive device: 1 person hand held assist;Rolling walker (2 wheeled) Gait Pattern/deviations: Step-through pattern;Decreased stride length Gait velocity: Decreased   General Gait Details: Pt ambulated several feet with HHA. Noted instability and min A required  for balance. When using RW for support, pt appeared more stable and required assist for supervision only.    Stairs             Wheelchair Mobility    Modified Rankin (Stroke Patients Only)       Balance Overall balance assessment: Needs assistance Sitting-balance support: Bilateral upper extremity supported;Feet supported Sitting balance-Leahy Scale: Fair     Standing balance support: Single extremity supported;During functional activity Standing balance-Leahy Scale: Poor Standing balance comment: Reliant on at least 1 UE support                             Cognition Arousal/Alertness: Awake/alert Behavior During Therapy: WFL for tasks assessed/performed Overall Cognitive Status: Impaired/Different from baseline Area of Impairment: Memory;Following commands;Safety/judgement                     Memory: Decreased short-term memory Following Commands: Follows one step commands consistently Safety/Judgement: Decreased awareness of safety;Decreased awareness of deficits     General Comments: Pt slightly impulsive with standing, requiring cues for safety. She reported that she would have assist at d/c for a few hours a day, but when questioned who would be with her she states "I don't Know".      Exercises Other Exercises Other Exercises: sit<>stand 3 ways for neuromuscular re-ed. Sit<>stand no UE 5x, Sit<>stand L foot lateral 5x, sit<>stand L foot forward 5x. Min A required for L foot forward, all others required min guard.    General Comments General comments (skin integrity, edema, etc.): Pt  with expressive apahasia and no family present      Pertinent Vitals/Pain Pain Assessment: No/denies pain    Home Living Family/patient expects to be discharged to:: Private residence Living Arrangements: Alone Available Help at Discharge: Family;Available 24 hours/day Type of Home: Apartment Home Access: Elevator   Home Layout: One level Home  Equipment: Cane - single point      Prior Function Level of Independence: Independent          PT Goals (current goals can now be found in the care plan section) Acute Rehab PT Goals Patient Stated Goal: to go home PT Goal Formulation: With patient Time For Goal Achievement: 03/18/20 Potential to Achieve Goals: Good Progress towards PT goals: Progressing toward goals    Frequency    Min 4X/week      PT Plan Discharge plan needs to be updated;Equipment recommendations need to be updated    Co-evaluation              AM-PAC PT "6 Clicks" Mobility   Outcome Measure  Help needed turning from your back to your side while in a flat bed without using bedrails?: A Little Help needed moving from lying on your back to sitting on the side of a flat bed without using bedrails?: A Little Help needed moving to and from a bed to a chair (including a wheelchair)?: A Little Help needed standing up from a chair using your arms (e.g., wheelchair or bedside chair)?: A Little Help needed to walk in hospital room?: A Little Help needed climbing 3-5 steps with a railing? : A Lot 6 Click Score: 17    End of Session Equipment Utilized During Treatment: Gait belt Activity Tolerance: Patient tolerated treatment well Patient left: with call bell/phone within reach;in chair Nurse Communication: Mobility status;Other (comment)(pt asking for food) PT Visit Diagnosis: Unsteadiness on feet (R26.81);Muscle weakness (generalized) (M62.81)     Time: 9381-0175 PT Time Calculation (min) (ACUTE ONLY): 16 min  Charges:  $Neuromuscular Re-education: 8-22 mins                    Kallie Locks, [place] Pager [phone] Acute Rehab  Heather Nielsen 03/06/2020, 12:49 PM

## 2020-03-06 NOTE — TOC Transition Note (Addendum)
Transition of Care [facility]) - CM/SW Discharge Note   Patient Details  Name: Heather Nielsen MRN: [id number] Date of Birth: 19-Jun-1934  [facility] CM/SW Contact:  Lawerance Sabal, RN Phone Number: 03/06/2020, 12:52 PM   Clinical Narrative:    Unable to reach patient. Spoke w patient's daughter. Harrell Lark Daughter   [phone]    She confirms DC address.  She would like RW, order placed and [employer] to deliver to room within the hour.  We discussed dispo plan. HH decided over outpatient. Will need PT OT ST. Requested orders from Dr Gerri Lins Agreeable to any provider who will accept. Referral made to [facility]- pending.   [name] accepted for start of care mid week.     Final next level of care: Home w Home Health Services Barriers to Discharge: No Barriers Identified   Patient Goals and CMS Choice Patient states their goals for this hospitalization and ongoing recovery are:: to go home CMS Medicare.gov Compare Post Acute Care list provided to:: Other (Comment Required) Choice offered to / list presented to : Adult Children  Discharge Placement                       Discharge Plan and Services                DME Arranged: Walker rolling DME Agency: Other - Comment Date DME Agency Contacted: 03/06/20 Time DME Agency Contacted: 1252 Representative spoke with at DME Agency: Vaughan Basta HH Arranged: PT, OT, Speech Therapy          Social Determinants of Health (SDOH) Interventions     Readmission Risk Interventions No flowsheet data found.

## 2020-03-06 NOTE — Evaluation (Signed)
Occupational Therapy Evaluation Patient Details Name: Heather Nielsen MRN: [id number] DOB: Apr 17, 1934 Today's Date: 03/06/2020    History of Present Illness Pt is an 86 y/o female admitted secondary to dysarthria and R sided weakness. Found to have L MCA infarct. PMH includes bilateral THA.    Clinical Impression   Pt PTA: living at home with family support, but from chart- pt was independent prior. Pt currently performing UB ADL with set-upA and supervisionA to minguardA for LB ADL. Pt with expressive aphasia. Pt stood at sink for ADL with cues for sequencing. Pt performing tasks with decreased strength and coordination in RUE. Pt requiring assist to grip RW. Pt would benefit from continued OT skilled services for ADL, mobility and safety. OT following acutely.      Follow Up Recommendations  Home health OT;Supervision/Assistance - 24 hour    Equipment Recommendations  None recommended by OT    Recommendations for Other Services       Precautions / Restrictions Precautions Precautions: Fall Restrictions Weight Bearing Restrictions: No      Mobility Bed Mobility Overal bed mobility: Needs Assistance Bed Mobility: Supine to Sit     Supine to sit: Min guard     General bed mobility comments: Min guard for safety throughout bed mobility tasks.   Transfers Overall transfer level: Needs assistance Equipment used: Rolling walker (2 wheeled) Transfers: Sit to/from Stand Sit to Stand: Min guard         General transfer comment: Min guard for steadying assist to stand.     Balance Overall balance assessment: Needs assistance Sitting-balance support: Bilateral upper extremity supported;Feet supported Sitting balance-Leahy Scale: Fair     Standing balance support: Single extremity supported;During functional activity Standing balance-Leahy Scale: Poor Standing balance comment: Reliant on at least 1 UE support                            ADL either performed  or assessed with clinical judgement   ADL Overall ADL's : Needs assistance/impaired                                             Vision Baseline Vision/History: No visual deficits Vision Assessment?: No apparent visual deficits     Perception     Praxis      Pertinent Vitals/Pain Pain Assessment: No/denies pain     Hand Dominance Right   Extremity/Trunk Assessment Upper Extremity Assessment Upper Extremity Assessment: Generalized weakness;RUE deficits/detail RUE Deficits / Details: 3/5 mm grade weakness   Lower Extremity Assessment Lower Extremity Assessment: Generalized weakness;Defer to PT evaluation   Cervical / Trunk Assessment Cervical / Trunk Assessment: Normal   Communication Communication Communication: Expressive difficulties(expressive aphasia)   Cognition Arousal/Alertness: Awake/alert Behavior During Therapy: WFL for tasks assessed/performed Overall Cognitive Status: Within Functional Limits for tasks assessed                                 General Comments: Dysarthria noted, however, cognition seems [facility] for basic tasks.    General Comments  Pt with expressive apahasia and no family present    Exercises     Shoulder Instructions      Home Living Family/patient expects to be discharged to:: Private residence Living Arrangements: Alone Available Help at Discharge:  Family;Available 24 hours/day Type of Home: Apartment Home Access: Elevator     Home Layout: One level     Bathroom Shower/Tub: Occupational psychologist: Handicapped height     Home Equipment: Cane - single point      Lives With: Alone    Prior Functioning/Environment Level of Independence: Independent                 OT Problem List: Decreased strength;Decreased activity tolerance;Impaired balance (sitting and/or standing);Decreased coordination;Decreased cognition;Decreased safety awareness;Impaired UE functional  use;Pain;Increased edema      OT Treatment/Interventions: Self-care/ADL training;Therapeutic exercise;Energy conservation;DME and/or AE instruction;Therapeutic activities;Cognitive remediation/compensation;Patient/family education;Balance training;Neuromuscular education    OT Goals(Current goals can be found in the care plan section) Acute Rehab OT Goals Patient Stated Goal: to go home OT Goal Formulation: With patient Time For Goal Achievement: 03/20/20 Potential to Achieve Goals: Good ADL Goals Pt/caregiver will Perform Home Exercise Program: Increased strength;Right Upper extremity;With Supervision Additional ADL Goal #1: Pt will follow multistep command with set-upA and  minimal cueing to perform task.  OT Frequency: Min 2X/week   Barriers to D/C:            Co-evaluation              AM-PAC OT "6 Clicks" Daily Activity     Outcome Measure Help from another person eating meals?: A Little Help from another person taking care of personal grooming?: A Little Help from another person toileting, which includes using toliet, bedpan, or urinal?: A Little Help from another person bathing (including washing, rinsing, drying)?: A Lot Help from another person to put on and taking off regular upper body clothing?: A Little Help from another person to put on and taking off regular lower body clothing?: A Little 6 Click Score: 17   End of Session Equipment Utilized During Treatment: Rolling walker;Gait belt Nurse Communication: Mobility status  Activity Tolerance: Patient tolerated treatment well Patient left: in bed;with call bell/phone within reach;with bed alarm set  OT Visit Diagnosis: Unsteadiness on feet (R26.81);Muscle weakness (generalized) (M62.81);Other symptoms and signs involving cognitive function                Time: 1059-1130 OT Time Calculation (min): 31 min Charges:  OT General Charges $OT Visit: 1 Visit OT Evaluation $OT Eval Moderate Complexity: 1 Mod OT  Treatments $Self Care/Home Management : 8-22 mins  Jefferey Pica, OTR/L Acute Rehabilitation Services Pager: [phone] Office: [phone]   Jefferey Pica 03/06/2020, 12:09 PM

## 2020-03-07 NOTE — Discharge Summary (Signed)
Physician Discharge Summary  Heather Nielsen JXB:[id number] DOB: Dec 27, 1933 DOA: 03/03/2020  PCP: Patient, No Pcp Per  Admit date: 03/03/2020 Discharge date: 03/07/2020  Recommendations for Outpatient Follow-up:  1. Follow up PCP in 7-10 days. 2. Follow up with neurology as directed 3. Continue Aspirin and Plavix for 3 months, then just aspirin.  Follow-up Information    Graciella Freer, PA-C Follow up.   Specialty: Physician Assistant Why: 03/15/2020 @ 11;45AM, wound check visit (heart monitor) Contact information: [address] [place] [address] [phone]        [facility]. Schedule an appointment as soon as possible for a visit in 4 week(s).   Specialty: Neurology Contact information: [address] [place] [address] [phone]       [facility], [facility] Follow up.   Specialty: Home Health Services Why: For home health services, they will call in the next few days to set up an appointment at the house mid week.  Contact information: [address] [place] [address] [phone]          Discharge Diagnoses: Principal diagnosis is #1 1. Acute left MCA territory infarct. 2. No recent contact with the healthcare system 3. Hyperlipidemia 4. Hypertension 5. ETOH  Discharge Condition: Fair  Disposition: SNF  Diet recommendation: Heart healthy  Filed Weights   03/06/20 1343  Weight: 47.2 kg    History of present illness:  Heather Nielsen is a 86 y.o. female with medical history significant of arthitis. She presented with sudden onset of aphasia right-sided facial droop right-sided weakness last time was seen normal about 12 on 16 March patient was outside of code stroke window. On the date of admission there was noted to have a bruise in her head suspect the patient had an unwitnessed fall.  No cough no fever unable to provide history secondary to aphasia. Pt has not been  seen by PCP she does not go to the doctors. At her baseline she is very clear minded and walks a few miles a day. She still drinks one drink a day but stopped smoking.  [facility] have been consulted to admit the patient for further evaluation and treatment.  Hospital Course: The patient has tested negative for COVID-19. She has passed her swallow eval. She is being roomed in the ED pending bed availability upstairs.   CTA head and neck and perfusion of brain demonstrated occlusion of Lt MCA at the origin with infarct of the left basal ganglia. MRI Brain is pending. Echocardiogram has demonstrated EF of 60-65%. There is Grade II diastolic dysfuntion. LA size was severely dilated. RV systolic function was normal. There was normal RV systolic pressure. No intracardiac source of embolism was detected. Neurology has recommeded MRA head and neck. This has demonstrated: 1. Persistent occlusion of the proximal left MCA with absent flow related enhancement in the left MCA vascular tree. 2. Stable moderate stenosis at the proximal right P2 segment.  The patient has been evaluated by PT/OT. There recommendation is for home with home health PT.  The patient is doing well and is being discharged to home with home health PT today. Rolling walker has been ordered.  Today's assessment: S: The patient is resting comfortably. No new complaints. O: Vitals:  Vitals:   03/06/20 1250 03/06/20 1343  BP: (!) 153/81 (!) 153/91  Pulse: 71 71  Resp: 19 19  Temp: 98.3 F (36.8 C) 98.3 F (36.8 C)  SpO2: 99%    Constitutional:   The patient is awake, alert, and oriented x 3. No acute distress. Respiratory:   No increased work of breathing.  No wheezes, rales, or rhonchi  No tactile fremitus Cardiovascular:   Regular rate and rhythm  No murmurs, ectopy, or gallups.  No lateral PMI. No thrills. Abdomen:   Abdomen is soft, non-tender, non-distended  No hernias, masses, or  organomegaly  Normoactive bowel sounds.  Musculoskeletal:   No cyanosis, clubbing, or edema Skin:   No rashes, lesions, ulcers  palpation of skin: no induration or nodules Neurologic:   CN 2-12 intact, except for 7 on the right with facial droop  Weakness on right  Sensation all 4 extremities intact  Significant speech deficits both in word finding and articulation. Psychiatric:   Mental status ? Mood, affect appropriate ? Orientation to person, place, time   judgment and insight appear intact    Discharge Instructions  Discharge Instructions    Activity as tolerated - No restrictions   Complete by: As directed    Call MD for:   Complete by: As directed    Neurological changes   Diet - low sodium heart healthy   Complete by: As directed    Discharge instructions   Complete by: As directed    Follow up PCP in 7-10 days. Follow up with neurology as directed Continue Aspirin and Plavix for 3 months, then just aspirin.   Increase activity slowly   Complete by: As directed      Allergies as of 03/06/2020      Reactions   Penicillins Other (See Comments)   Has patient had a PCN reaction causing immediate rash, facial/tongue/throat swelling, SOB or lightheadedness with hypotension:No Has patient had a PCN reaction causing severe rash involving mucus membranes or skin necrosis:No Has patient had a PCN reaction that required hospitalization:No Has patient had a PCN reaction occurring within the last 10 years:No If all of the above answers are "NO", then may proceed with Cephalosporin use. Urinary issues--pt.says "felt like razor blades in her bladder"   Pneumococcal Vaccines Hives, Itching, Rash      Medication List    STOP taking these medications   methocarbamol 500 MG tablet Commonly known as: ROBAXIN   ondansetron 4 MG tablet Commonly known as: ZOFRAN   oxyCODONE 5 MG immediate release tablet Commonly known as: Oxy IR/ROXICODONE   rivaroxaban 10 MG  Tabs tablet Commonly known as: XARELTO   traMADol 50 MG tablet Commonly known as: ULTRAM     TAKE these medications   aspirin 325 MG tablet Take 1 tablet (325 mg total) by mouth daily.   atorvastatin 40 MG tablet Commonly known as: LIPITOR Take 1 tablet (40 mg total) by mouth daily at 6 PM.   clopidogrel 75 MG tablet Commonly known as: Plavix Take 1 tablet (75 mg total) by mouth daily.   latanoprost 0.005 % ophthalmic solution Commonly known as: XALATAN Place 1 drop into the right eye daily.   lisinopril 10 MG tablet Commonly known as: ZESTRIL Take 1 tablet (10 mg total) by mouth daily.     ASK your doctor about these medications    stroke: mapping our early stages of recovery book Misc 1 each by Does not apply route once for 1 dose. Ask about: Should I take this medication?      Allergies  Allergen Reactions  . Penicillins Other (See Comments)    Has patient had a PCN reaction causing immediate rash,  facial/tongue/throat swelling, SOB or lightheadedness with hypotension:No Has patient had a PCN reaction causing severe rash involving mucus membranes or skin necrosis:No Has patient had a PCN reaction that required hospitalization:No Has patient had a PCN reaction occurring within the last 10 years:No If all of the above answers are "NO", then may proceed with Cephalosporin use. Urinary issues--pt.says "felt like razor blades in her bladder"  . Pneumococcal Vaccines Hives, Itching and Rash    The results of significant diagnostics from this hospitalization (including imaging, microbiology, ancillary and laboratory) are listed below for reference.    Significant Diagnostic Studies: CT Angio Head W or Wo Contrast  Result Date: 03/03/2020 CLINICAL DATA:  Sudden onset aphasia and right-sided facial droop. Right-sided weakness. EXAM: CT ANGIOGRAPHY HEAD AND NECK TECHNIQUE: Multidetector CT imaging of the head and neck was performed using the standard protocol during bolus  administration of intravenous contrast. Multiplanar CT image reconstructions and MIPs were obtained to evaluate the vascular anatomy. Carotid stenosis measurements (when applicable) are obtained utilizing NASCET criteria, using the distal internal carotid diameter as the denominator. CONTRAST:  OMNIPAQUE IOHEXOL 350 MG/ML SOLN COMPARISON:  None. FINDINGS: CT HEAD FINDINGS Brain: There is no mass, hemorrhage or extra-axial collection. The size and configuration of the ventricles and extra-axial CSF spaces are normal. There is hypoattenuation within the left basal ganglia and left frontal white matter. There is hypoattenuation of the periventricular white matter, most commonly indicating chronic ischemic microangiopathy. Skull: The visualized skull base, calvarium and extracranial soft tissues are normal. Sinuses/Orbits: No fluid levels or advanced mucosal thickening of the visualized paranasal sinuses. No mastoid or middle ear effusion. The orbits are normal. ASPECTS [facility] Stroke Program Early CT Score) - Ganglionic level infarction (caudate, lentiform nuclei, internal capsule, insula, M1-M3 cortex): 4 - Supraganglionic infarction (M4-M6 cortex): 2 Total score (0-10 with 10 being normal): 6 Review of the MIP images confirms the above findings CTA NECK FINDINGS SKELETON: There is no bony spinal canal stenosis. No lytic or blastic lesion. OTHER NECK: Normal pharynx, larynx and major salivary glands. No cervical lymphadenopathy. Unremarkable thyroid gland. UPPER CHEST: No pneumothorax or pleural effusion. No nodules or masses. AORTIC ARCH: There is mild calcific atherosclerosis of the aortic arch. There is no aneurysm, dissection or hemodynamically significant stenosis of the visualized portion of the aorta. Conventional 3 vessel aortic branching pattern. The visualized proximal subclavian arteries are widely patent. RIGHT CAROTID SYSTEM: Normal without aneurysm, dissection or stenosis. LEFT CAROTID SYSTEM: No  dissection, occlusion or aneurysm. There is mixed density atherosclerosis extending into the proximal ICA, resulting in less than 50% stenosis. VERTEBRAL ARTERIES: Left dominant configuration. Mild narrowing of both vertebral artery origins. There is no dissection, occlusion or flow-limiting stenosis to the skull base (V1-V3 segments). CTA HEAD FINDINGS POSTERIOR CIRCULATION: --Vertebral arteries: Normal V4 segments. --Posterior inferior cerebellar arteries (PICA): Patent origins from the vertebral arteries. --Anterior inferior cerebellar arteries (AICA): Patent origins from the basilar artery. --Basilar artery: Normal. --Superior cerebellar arteries: Normal. --Posterior cerebral arteries: Moderate stenosis of the P2 segment of the right PCA. Fetal origin of the left. ANTERIOR CIRCULATION: --Intracranial internal carotid arteries: Atherosclerotic calcification of the internal carotid arteries at the skull base without hemodynamically significant stenosis. --Anterior cerebral arteries (ACA): Normal. Both A1 segments are present. Patent anterior communicating artery (a-comm). --Middle cerebral arteries (MCA): The left middle cerebral artery is occluded at its origin. The right MCA is widely patent. VENOUS SINUSES: As permitted by contrast timing, patent. ANATOMIC VARIANTS: Fetal origin of the left posterior cerebral  artery. Review of the MIP images confirms the above findings. IMPRESSION: 1. 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 2. Moderate stenosis of the P2 segment of the right posterior cerebral artery. 3. Hypoattenuation within the anterior left MCA territory and left basal ganglia, consistent with early subacute infarct. ASPECTS is 6. 4. No hemorrhage or mass effect. Critical Value/emergent results were called by telephone at the time of interpretation on 03/03/2020 at 9:45 pm to provider [facility] , who verbally acknowledged these results. Electronically Signed   By: Deatra Robinson M.D.    On: 03/03/2020 22:02   DG Chest 2 View  Result Date: 03/04/2020 CLINICAL DATA:  Stroke-like symptoms, sudden onset of aphasia EXAM: CHEST - 2 VIEW COMPARISON:  None. FINDINGS: Slight hyperinflation along with coarse reticular opacity in the bases favor chronic interstitial change. No consolidation, features of edema, pneumothorax, or effusion. The aorta is calcified. The remaining cardiomediastinal contours are unremarkable. No acute osseous or soft tissue abnormality. Degenerative changes are present in the imaged spine and shoulders. Telemetry leads overlie the chest. IMPRESSION: Slight hyperinflation and likely chronic interstitial change. No No acute cardiopulmonary abnormality. Aortic Atherosclerosis (ICD10-I70.0). Electronically Signed   By: Kreg Shropshire M.D.   On: 03/04/2020 04:09   DG Abd 1 View  Result Date: 03/04/2020 CLINICAL DATA:  MRI screening EXAM: ABDOMEN - 1 VIEW COMPARISON:  None. FINDINGS: Partially visualized bilateral total hip arthroplasty. Excreted contrast distends the urinary bladder. No disproportionately dilated small bowel loops. No evidence of pneumatosis or pneumoperitoneum. Moderate lumbar spondylosis. Clear lung bases. IMPRESSION: Partially visualized bilateral total hip arthroplasty. Excreted contrast distends the urinary bladder. Normal obstructive bowel gas pattern Electronically Signed   By: Delbert Phenix M.D.   On: 03/04/2020 08:37   CT Angio Neck W and/or Wo Contrast  Result Date: 03/03/2020 CLINICAL DATA:  Sudden onset aphasia and right-sided facial droop. Right-sided weakness. EXAM: CT ANGIOGRAPHY HEAD AND NECK TECHNIQUE: Multidetector CT imaging of the head and neck was performed using the standard protocol during bolus administration of intravenous contrast. Multiplanar CT image reconstructions and MIPs were obtained to evaluate the vascular anatomy. Carotid stenosis measurements (when applicable) are obtained utilizing NASCET criteria, using the distal internal  carotid diameter as the denominator. CONTRAST:  OMNIPAQUE IOHEXOL 350 MG/ML SOLN COMPARISON:  None. FINDINGS: CT HEAD FINDINGS Brain: There is no mass, hemorrhage or extra-axial collection. The size and configuration of the ventricles and extra-axial CSF spaces are normal. There is hypoattenuation within the left basal ganglia and left frontal white matter. There is hypoattenuation of the periventricular white matter, most commonly indicating chronic ischemic microangiopathy. Skull: The visualized skull base, calvarium and extracranial soft tissues are normal. Sinuses/Orbits: No fluid levels or advanced mucosal thickening of the visualized paranasal sinuses. No mastoid or middle ear effusion. The orbits are normal. ASPECTS [facility] Stroke Program Early CT Score) - Ganglionic level infarction (caudate, lentiform nuclei, internal capsule, insula, M1-M3 cortex): 4 - Supraganglionic infarction (M4-M6 cortex): 2 Total score (0-10 with 10 being normal): 6 Review of the MIP images confirms the above findings CTA NECK FINDINGS SKELETON: There is no bony spinal canal stenosis. No lytic or blastic lesion. OTHER NECK: Normal pharynx, larynx and major salivary glands. No cervical lymphadenopathy. Unremarkable thyroid gland. UPPER CHEST: No pneumothorax or pleural effusion. No nodules or masses. AORTIC ARCH: There is mild calcific atherosclerosis of the aortic arch. There is no aneurysm, dissection or hemodynamically significant stenosis of the visualized portion of the aorta. Conventional 3 vessel  aortic branching pattern. The visualized proximal subclavian arteries are widely patent. RIGHT CAROTID SYSTEM: Normal without aneurysm, dissection or stenosis. LEFT CAROTID SYSTEM: No dissection, occlusion or aneurysm. There is mixed density atherosclerosis extending into the proximal ICA, resulting in less than 50% stenosis. VERTEBRAL ARTERIES: Left dominant configuration. Mild narrowing of both vertebral artery origins. There  is no dissection, occlusion or flow-limiting stenosis to the skull base (V1-V3 segments). CTA HEAD FINDINGS POSTERIOR CIRCULATION: --Vertebral arteries: Normal V4 segments. --Posterior inferior cerebellar arteries (PICA): Patent origins from the vertebral arteries. --Anterior inferior cerebellar arteries (AICA): Patent origins from the basilar artery. --Basilar artery: Normal. --Superior cerebellar arteries: Normal. --Posterior cerebral arteries: Moderate stenosis of the P2 segment of the right PCA. Fetal origin of the left. ANTERIOR CIRCULATION: --Intracranial internal carotid arteries: Atherosclerotic calcification of the internal carotid arteries at the skull base without hemodynamically significant stenosis. --Anterior cerebral arteries (ACA): Normal. Both A1 segments are present. Patent anterior communicating artery (a-comm). --Middle cerebral arteries (MCA): The left middle cerebral artery is occluded at its origin. The right MCA is widely patent. VENOUS SINUSES: As permitted by contrast timing, patent. ANATOMIC VARIANTS: Fetal origin of the left posterior cerebral artery. Review of the MIP images confirms the above findings. IMPRESSION: 1. 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 2. Moderate stenosis of the P2 segment of the right posterior cerebral artery. 3. Hypoattenuation within the anterior left MCA territory and left basal ganglia, consistent with early subacute infarct. ASPECTS is 6. 4. No hemorrhage or mass effect. Critical Value/emergent results were called by telephone at the time of interpretation on 03/03/2020 at 9:45 pm to provider [facility] , who verbally acknowledged these results. Electronically Signed   By: Deatra Robinson M.D.   On: 03/03/2020 22:02   MR ANGIO HEAD WO CONTRAST  Result Date: 03/05/2020 CLINICAL DATA:  Stroke follow-up EXAM: MRA HEAD WITHOUT CONTRAST TECHNIQUE: Angiographic images of the Circle of Willis were obtained using MRA technique without  intravenous contrast. COMPARISON:  CT angiogram of the head and neck March 03, 2020 FINDINGS: The visualized portions of the distal cervical internal carotid arteries are widely patent with normal flow related enhancement. Luminal irregularity is noted in the cavernous segment of the bilateral ICAs, suggestive of atherosclerotic disease, without hemodynamically significant stenosis. Persistent occlusion of the proximal left MCA with absent flow related enhancement in the entire left MCA vascular tree. The right MCA and bilateral ACA have normal caliber and flow related enhancement. No intracranial aneurysm within the anterior circulation. The vertebral arteries are widely patent with antegrade flow. The posterior inferior cerebral arteries are normal. Vertebrobasilar junction and basilar artery are widely patent with antegrade flow without evidence of basilar stenosis or aneurysm. Moderate stenosis is again demonstrated at the proximal right P2 segment. The left posterior cerebral artery originates to likely from the left ICA (fetal PCA) and appear normal. IMPRESSION: 1. Persistent occlusion of the proximal left MCA with absent flow related enhancement in the left MCA vascular tree. 2. Stable moderate stenosis at the proximal right P2 segment. Electronically Signed   By: Baldemar Lenis M.D.   On: 03/05/2020 12:30   MR BRAIN WO CONTRAST  Result Date: 03/04/2020 CLINICAL DATA:  Sudden onset aphasia and right-sided facial droop EXAM: MRI HEAD WITHOUT CONTRAST TECHNIQUE: Multiplanar, multiecho pulse sequences of the brain and surrounding structures were obtained without intravenous contrast. COMPARISON:  CT and CTA from yesterday FINDINGS: Brain: Acute infarct that is confluent at the basal ganglia and patchy  in the superior temporal, insular, and lateral frontal cortex. No contralateral or posterior circulation acute infarcts. No acute hemorrhage. No hydrocephalus, collection, or masslike finding.  Small remote left paramedian superior cerebellar infarct. Mild patchy chronic small vessel ischemia in the cerebral white matter. Remote lacunar infarct in the left thalamus. Generalized atrophy that is moderate. Vascular: Poor visualization of left M2 flow voids, stable from preceding CTA. Skull and upper cervical spine: Negative for marrow lesion. There is degenerative facet and endplate spurring. Sinuses/Orbits: Negative IMPRESSION: 1. Acute infarct in the left MCA territory that is confluent at the basal ganglia and patchy along the inferior division cortex. No apparent progression compared to CT and CTA yesterday. 2. Background of atrophy and chronic small vessel ischemic injury. Electronically Signed   By: Marnee SpringJonathon  Watts M.D.   On: 03/04/2020 10:23   EP PPM/ICD IMPLANT  Result Date: 03/05/2020 SURGEON:  Hillis RangeJames Allred, MD   PREPROCEDURE DIAGNOSIS:  Cryptogenic Stroke   POSTPROCEDURE DIAGNOSIS:  Cryptogenic Stroke    PROCEDURES:  1. Implantable loop recorder implantation   INTRODUCTION:  Heather Nielsen is a 86 y.o. female with a history of unexplained stroke who presents today for implantable loop implantation.  The patient has had a cryptogenic stroke.  Despite an extensive workup by neurology, no reversible causes have been identified.  she has worn telemetry during which she did not have arrhythmias.  There is significant concern for possible atrial fibrillation as the cause for the patients stroke.  The patient therefore presents today for implantable loop implantation.   DESCRIPTION OF PROCEDURE:  Informed written consent was obtained.  The patient required no sedation for the procedure today.  The patients left chest was prepped and draped. Mapping over the patient's chest was performed to identify the appropriate ILR site.  This area was found to be the left parasternal region over the 3rd-4th intercostal space.  The skin overlying this region was infiltrated with lidocaine for local analgesia.  A  0.5-cm incision was made at the implant site.  A subcutaneous ILR pocket was fashioned using a combination of sharp and blunt dissection.  A Medtronic Reveal Linq model [id number] implantable loop recorder was then placed into the pocket R waves were very prominent and measured > 0.2 mV. EBL<1 ml.  Steri- Strips and a sterile dressing were then applied.  There were no early apparent complications.   CONCLUSIONS:  1. Successful implantation of a Medtronic Reveal LINQ implantable loop recorder for cryptogenic stroke  2. No early apparent complications. Hillis RangeJames Allred MD, [facility] 03/05/2020 4:27 PM   CT CEREBRAL PERFUSION W CONTRAST  Result Date: 03/03/2020 CLINICAL DATA:  Left MCA occlusion EXAM: CT PERFUSION BRAIN TECHNIQUE: Multiphase CT imaging of the brain was performed following IV bolus contrast injection. Subsequent parametric perfusion maps were calculated using RAPID software. CONTRAST:  40mL OMNIPAQUE IOHEXOL 350 MG/ML SOLN COMPARISON:  None. FINDINGS: CT Brain Perfusion Findings: CBF (<30%) Volume: 5mL Perfusion (Tmax>6.0s) volume: 99mL Mismatch Volume: 94mL ASPECTS on noncontrast CT Head: 6 at 9:04 p.m. today. Infarct Core: Based on the noncontrast head CT, the infarct core is larger than the 5 mL calculated by CBF. Qualitatively, the infarct core is probably approximately half of the ischemic volume calculated by Tmax>6.0s, i.e. 50 mL. Infarction Location:Left MCA territory, predominantly the insula and left basal ganglia. IMPRESSION: 1. Underestimation of infarct core by cerebral blood flow criteria. Actual core volume is probably closer to 50 mL. 2. Over estimation of ischemic penumbra, with actual penumbra volume probably on  the order 40-50 mL. Electronically Signed   By: Ulyses Jarred M.D.   On: 03/03/2020 23:46   ECHOCARDIOGRAM COMPLETE  Result Date: 03/04/2020    ECHOCARDIOGRAM REPORT   Patient Name:   Heather Nielsen Date of Exam: 03/04/2020 Medical Rec #:  [id number]      Height:       62.0 in Accession  #:    [id number]     Weight:       104.0 lb Date of Birth:  May 01, 1934       BSA:          1.448 m Patient Age:    89 years       BP:           162/65 mmHg Patient Gender: F              HR:           67 bpm. Exam Location:  Inpatient Procedure: 2D Echo, Cardiac Doppler and Color Doppler Indications:    Stroke 434.91 / I163.9  History:        Patient has no prior history of Echocardiogram examinations.  Sonographer:    Jonelle Sidle Dance Referring Phys: [id number] [place]  1. Left ventricular ejection fraction, by estimation, is 60 to 65%. The left ventricle has normal function. The left ventricle has no regional wall motion abnormalities. Left ventricular diastolic parameters are consistent with Grade II diastolic dysfunction (pseudonormalization). Elevated left atrial pressure.  2. Right ventricular systolic function is normal. The right ventricular size is normal. There is normal pulmonary artery systolic pressure.  3. Left atrial size was severely dilated.  4. The mitral valve is grossly normal. Mild mitral valve regurgitation. No evidence of mitral stenosis.  5. The aortic valve is tricuspid. Aortic valve regurgitation is mild. Mild aortic valve sclerosis is present, with no evidence of aortic valve stenosis.  6. The inferior vena cava is normal in size with greater than 50% respiratory variability, suggesting right atrial pressure of 3 mmHg. Conclusion(s)/Recommendation(s): No intracardiac source of embolism detected on this transthoracic study. A transesophageal echocardiogram is recommended to exclude cardiac source of embolism if clinically indicated. FINDINGS  Left Ventricle: Left ventricular ejection fraction, by estimation, is 60 to 65%. The left ventricle has normal function. The left ventricle has no regional wall motion abnormalities. The left ventricular internal cavity size was normal in size. There is  no left ventricular hypertrophy. Left ventricular diastolic parameters are consistent  with Grade II diastolic dysfunction (pseudonormalization). Elevated left atrial pressure. Right Ventricle: The right ventricular size is normal. No increase in right ventricular wall thickness. Right ventricular systolic function is normal. There is normal pulmonary artery systolic pressure. The tricuspid regurgitant velocity is 2.74 m/s, and  with an assumed right atrial pressure of 3 mmHg, the estimated right ventricular systolic pressure is 87.5 mmHg. Left Atrium: Left atrial size was severely dilated. Right Atrium: Right atrial size was normal in size. Pericardium: Trivial pericardial effusion is present. Presence of pericardial fat pad. Mitral Valve: The mitral valve is grossly normal. Mild mitral annular calcification. Mild mitral valve regurgitation. No evidence of mitral valve stenosis. Tricuspid Valve: The tricuspid valve is grossly normal. Tricuspid valve regurgitation is not demonstrated. No evidence of tricuspid stenosis. Aortic Valve: The aortic valve is tricuspid. . There is moderate thickening and moderate calcification of the aortic valve. Aortic valve regurgitation is mild. Aortic regurgitation PHT measures 552 msec. Mild aortic valve sclerosis is present, with no evidence of  aortic valve stenosis. There is moderate thickening of the aortic valve. There is moderate calcification of the aortic valve. Pulmonic Valve: The pulmonic valve was grossly normal. Pulmonic valve regurgitation is trivial. No evidence of pulmonic stenosis. Aorta: The aortic root and ascending aorta are structurally normal, with no evidence of dilitation. Venous: The inferior vena cava is normal in size with greater than 50% respiratory variability, suggesting right atrial pressure of 3 mmHg. IAS/Shunts: No atrial level shunt detected by color flow Doppler.  LEFT VENTRICLE PLAX 2D LVIDd:         4.92 cm  Diastology LVIDs:         3.47 cm  LV e' lateral:   8.38 cm/s LV PW:         0.94 cm  LV E/e' lateral: 11.9 LV IVS:        0.85  cm  LV e' medial:    5.55 cm/s LVOT diam:     2.10 cm  LV E/e' medial:  18.0 LV SV:         69 LV SV Index:   48 LVOT Area:     3.46 cm  RIGHT VENTRICLE             IVC RV Basal diam:  2.95 cm     IVC diam: 1.61 cm RV S prime:     13.30 cm/s TAPSE (M-mode): 2.4 cm LEFT ATRIUM             Index       RIGHT ATRIUM           Index LA diam:        3.90 cm 2.69 cm/m  RA Area:     16.00 cm LA Vol (A2C):   76.7 ml 52.98 ml/m RA Volume:   43.50 ml  30.04 ml/m LA Vol (A4C):   97.7 ml 67.48 ml/m LA Biplane Vol: 88.7 ml 61.26 ml/m  AORTIC VALVE LVOT Vmax:   84.20 cm/s LVOT Vmean:  55.300 cm/s LVOT VTI:    0.200 m AI PHT:      552 msec  AORTA Ao Root diam: 3.50 cm Ao Asc diam:  3.30 cm MITRAL VALVE               TRICUSPID VALVE MV Area (PHT): 3.85 cm    TR Peak grad:   30.0 mmHg MV Decel Time: 197 msec    TR Vmax:        274.00 cm/s MV E velocity: 99.80 cm/s MV A velocity: 86.50 cm/s  SHUNTS MV E/A ratio:  1.15        Systemic VTI:  0.20 m                            Systemic Diam: 2.10 cm Lennie Odor MD Electronically signed by Lennie Odor MD Signature Date/Time: 03/04/2020/3:54:30 PM    Final     Microbiology: Recent Results (from the past 240 hour(s))  Respiratory Panel by RT PCR (Flu A&B, Covid) - Nasopharyngeal Swab     Status: None   Collection Time: 03/03/20 10:42 PM   Specimen: Nasopharyngeal Swab  Result Value Ref Range Status   SARS Coronavirus 2 by RT PCR NEGATIVE NEGATIVE Final    Comment: (NOTE) SARS-CoV-2 target nucleic acids are NOT DETECTED. The SARS-CoV-2 RNA is generally detectable in upper respiratoy specimens during the acute phase of infection. The lowest concentration of SARS-CoV-2 viral copies this assay can  detect is 131 copies/mL. A negative result does not preclude SARS-Cov-2 infection and should not be used as the sole basis for treatment or other patient management decisions. A negative result may occur with  improper specimen collection/handling, submission of specimen  other than nasopharyngeal swab, presence of viral mutation(s) within the areas targeted by this assay, and inadequate number of viral copies (<131 copies/mL). A negative result must be combined with clinical observations, patient history, and epidemiological information. The expected result is Negative. Fact Sheet for Patients:  https://www.moore.com/ Fact Sheet for Healthcare Providers:  https://www.young.biz/ This test is not yet ap proved or cleared by the [place] FDA and  has been authorized for detection and/or diagnosis of SARS-CoV-2 by FDA under an Emergency Use Authorization (EUA). This EUA will remain  in effect (meaning this test can be used) for the duration of the COVID-19 declaration under Section 564(b)(1) of the Act, 21 U.S.C. section 360bbb-3(b)(1), unless the authorization is terminated or revoked sooner.    Influenza A by PCR NEGATIVE NEGATIVE Final   Influenza B by PCR NEGATIVE NEGATIVE Final    Comment: (NOTE) The Xpert Xpress SARS-CoV-2/FLU/RSV assay is intended as an aid in  the diagnosis of influenza from Nasopharyngeal swab specimens and  should not be used as a sole basis for treatment. Nasal washings and  aspirates are unacceptable for Xpert Xpress SARS-CoV-2/FLU/RSV  testing. Fact Sheet for Patients: https://www.moore.com/ Fact Sheet for Healthcare Providers: https://www.young.biz/ This test is not yet approved or cleared by the [place] FDA and  has been authorized for detection and/or diagnosis of SARS-CoV-2 by  FDA under an Emergency Use Authorization (EUA). This EUA will remain  in effect (meaning this test can be used) for the duration of the  Covid-19 declaration under Section 564(b)(1) of the Act, 21  U.S.C. section 360bbb-3(b)(1), unless the authorization is  terminated or revoked. Performed at [facility], [address] [place], [place] [address]        Labs: Basic Metabolic Panel: Recent Labs  Lab 03/03/20 1854 03/03/20 2030 03/05/20 0443  NA 135 134* 136  K 3.6 3.6 3.5  CL 97* 99 101  CO2 22  --  23  GLUCOSE 122* 112* 97  BUN 15 17 8   CREATININE 0.76 0.50 0.49  CALCIUM 9.5  --  8.6*   Liver Function Tests: Recent Labs  Lab 03/03/20 1854  AST 39  ALT 17  ALKPHOS 47  BILITOT 0.7  PROT 6.8  ALBUMIN 4.0   No results for input(s): LIPASE, AMYLASE in the last 168 hours. No results for input(s): AMMONIA in the last 168 hours. CBC: Recent Labs  Lab 03/03/20 1854 03/03/20 2030 03/05/20 0443  WBC 10.7*  --  5.1  NEUTROABS 9.2*  --  3.3  HGB 13.0 12.2 11.5*  HCT 38.7 36.0 33.4*  MCV 92.8  --  90.8  PLT 262  --  219   Cardiac Enzymes: No results for input(s): CKTOTAL, CKMB, CKMBINDEX, TROPONINI in the last 168 hours. BNP: BNP (last 3 results) No results for input(s): BNP in the last 8760 hours.  ProBNP (last 3 results) No results for input(s): PROBNP in the last 8760 hours.  CBG: No results for input(s): GLUCAP in the last 168 hours.  Active Problems:   Acute ischemic stroke [facility])   Aphasia  Time coordinating discharge: 38 minutes  Signed:        Jeanise Durfey, DO [facility]  03/07/2020, 4:13 PM

## 2020-03-08 ENCOUNTER — Telehealth: Payer: Self-pay | Admitting: Internal Medicine

## 2020-03-08 ENCOUNTER — Telehealth: Payer: Self-pay | Admitting: Student

## 2020-03-08 MED FILL — Lidocaine Inj 1% w/ Epinephrine-1:100000: INTRAMUSCULAR | Qty: 20 | Status: AC

## 2020-03-08 NOTE — Telephone Encounter (Signed)
  I will gladly sign them to prevent her from losing Physical therapy, but we will not be her PCP.    Her visit next week is strictly for a wound check of her loop recorder, which was implanted for stroke. (Device clinic was full, otherwise would have been an RN only visit)  She will only be seen in our office as needed, and we will monitor her device remotely after that visit.   It looks like she also reached out to [facility] to establish.   Casimiro Needle [address] [place], PA-C  03/08/2020 11:33 AM

## 2020-03-08 NOTE — Telephone Encounter (Signed)
Pt's daughter, Lynnell Dike, stated that her mom was admitted in the hospital on Wednesday March 17 for a stroke and released on March 20. The doctor told her that she needs to see her PCP ASAP. Pt does not have one and would like to set up care with Ardyth Harps but need the appt this week b/c they can't do more than 5 days of the PT without a PCP.   Susie can be reached at [phone]

## 2020-03-08 NOTE — Telephone Encounter (Signed)
New Message:    Pt was discharged from the hospital on yesterday(03-07-20). She does not have a primary doctor at this time. She would like to know if Otilio Saber would sign the orders to have a Physical Therapist,  Occupational Therapist, and Speech Therapist. Pt will have an appt on 03-15-20 with Otilio Saber.

## 2020-03-08 NOTE — Telephone Encounter (Signed)
Left voicemail message for Heather Nielsen with [name] and advised [name], [place] will sign orders for therapies so pt will not lose PT but [facility] will not be her PCP and pt will need to establish care ASAP somewhere as we will be seeing her next week for a wound check and after that only as needed.  May contact RN at [phone] for any additional questions.

## 2020-03-09 DIAGNOSIS — E785 Hyperlipidemia, unspecified: Secondary | ICD-10-CM | POA: Diagnosis not present

## 2020-03-09 DIAGNOSIS — M19012 Primary osteoarthritis, left shoulder: Secondary | ICD-10-CM | POA: Diagnosis not present

## 2020-03-09 DIAGNOSIS — I10 Essential (primary) hypertension: Secondary | ICD-10-CM | POA: Diagnosis not present

## 2020-03-09 DIAGNOSIS — Z87891 Personal history of nicotine dependence: Secondary | ICD-10-CM | POA: Diagnosis not present

## 2020-03-09 DIAGNOSIS — M47816 Spondylosis without myelopathy or radiculopathy, lumbar region: Secondary | ICD-10-CM | POA: Diagnosis not present

## 2020-03-09 DIAGNOSIS — I69392 Facial weakness following cerebral infarction: Secondary | ICD-10-CM | POA: Diagnosis not present

## 2020-03-09 DIAGNOSIS — Z9181 History of falling: Secondary | ICD-10-CM | POA: Diagnosis not present

## 2020-03-09 DIAGNOSIS — I69320 Aphasia following cerebral infarction: Secondary | ICD-10-CM | POA: Diagnosis not present

## 2020-03-09 DIAGNOSIS — Z7982 Long term (current) use of aspirin: Secondary | ICD-10-CM | POA: Diagnosis not present

## 2020-03-09 DIAGNOSIS — K5900 Constipation, unspecified: Secondary | ICD-10-CM | POA: Diagnosis not present

## 2020-03-09 DIAGNOSIS — M19011 Primary osteoarthritis, right shoulder: Secondary | ICD-10-CM | POA: Diagnosis not present

## 2020-03-09 DIAGNOSIS — Z7902 Long term (current) use of antithrombotics/antiplatelets: Secondary | ICD-10-CM | POA: Diagnosis not present

## 2020-03-09 DIAGNOSIS — I69351 Hemiplegia and hemiparesis following cerebral infarction affecting right dominant side: Secondary | ICD-10-CM | POA: Diagnosis not present

## 2020-03-09 DIAGNOSIS — Z96641 Presence of right artificial hip joint: Secondary | ICD-10-CM | POA: Diagnosis not present

## 2020-03-09 NOTE — Telephone Encounter (Signed)
Left message on machine for daughter to return our call.  Okay to schedule an in office visit, 30 mins, can use  early afternoon slots per Dr Ardyth Harps.

## 2020-03-09 NOTE — Telephone Encounter (Signed)
Pt is scheduled for March 25 at 11:30am

## 2020-03-10 ENCOUNTER — Telehealth: Payer: Self-pay | Admitting: Internal Medicine

## 2020-03-10 NOTE — Telephone Encounter (Signed)
[name] from [place] stated that the pt has a medication interaction level 3 moderate, it is between the lisinopril 10 mg and the aspirin 325 mg.   She is also needing verbal Orders for speech therapy for 2w1 3w2 1w2 and aphasia  She can be reached at [phone] -ok to leave a detailed message per [name]

## 2020-03-10 NOTE — Telephone Encounter (Signed)
Patient has an appointment 03/11/20.

## 2020-03-11 ENCOUNTER — Telehealth: Payer: Self-pay | Admitting: General Practice

## 2020-03-11 ENCOUNTER — Ambulatory Visit (INDEPENDENT_AMBULATORY_CARE_PROVIDER_SITE_OTHER): Payer: PPO | Admitting: Internal Medicine

## 2020-03-11 ENCOUNTER — Other Ambulatory Visit: Payer: Self-pay

## 2020-03-11 ENCOUNTER — Encounter: Payer: Self-pay | Admitting: Internal Medicine

## 2020-03-11 VITALS — BP 120/70 | HR 66 | Temp 97.4°F | Ht 61.25 in | Wt 103.2 lb

## 2020-03-11 DIAGNOSIS — E785 Hyperlipidemia, unspecified: Secondary | ICD-10-CM | POA: Insufficient documentation

## 2020-03-11 DIAGNOSIS — I639 Cerebral infarction, unspecified: Secondary | ICD-10-CM

## 2020-03-11 NOTE — Patient Instructions (Signed)
-  Nice seeing you today!!  -Please schedule follow up in 3 months.

## 2020-03-11 NOTE — Progress Notes (Signed)
New Patient Office Visit     This visit occurred during the SARS-CoV-2 public health emergency.  Safety protocols were in place, including screening questions prior to the visit, additional usage of staff PPE, and extensive cleaning of exam room while observing appropriate contact time as indicated for disinfecting solutions.    CC/Reason for Visit: Establish care, hospital follow-up Previous PCP: None Last Visit: Unknown  HPI: Heather Nielsen is a 86 y.o. female who is coming in today for the above mentioned reasons.  No past medical history of significance prior to March 17.  On March 18 her daughter called her and noticed her to have slurred speech.  She went to the hospital where she was noted to have a left MCA CVA.  Her LDL was 206 she was started on statin.  She is currently on dual antiplatelet therapy for 3 months and then with plans to continue aspirin alone.  Because stroke was thought to be embolic, a loop recorder was placed.  She has follow-ups with neurology and cardiology upcoming.  She has been doing her PT/OT/ST.  Daughter has noticed some improvements.   Past Medical/Surgical History: Past Medical History:  Diagnosis Date  . Arthritis   . Complication of anesthesia   . PONV (postoperative nausea and vomiting)   . Urinary incontinence     Past Surgical History:  Procedure Laterality Date  . KNEE SURGERY  2007   menicus   . LOOP RECORDER INSERTION N/A 03/05/2020   Procedure: LOOP RECORDER INSERTION;  Surgeon: Hillis Range, MD;  Location: [facility];  Service: Cardiovascular;  Laterality: N/A;  . ROTATOR CUFF REPAIR Right 2006  . TOTAL HIP ARTHROPLASTY Right 06/16/2015   Procedure: RIGHT TOTAL HIP ARTHROPLASTY ANTERIOR APPROACH;  Surgeon: Ollen Gross, MD;  Location: [facility] ORS;  Service: Orthopedics;  Laterality: Right;  . TOTAL HIP ARTHROPLASTY Left 09/06/2016   Procedure: LEFT TOTAL HIP ARTHROPLASTY ANTERIOR APPROACH;  Surgeon: Ollen Gross, MD;   Location: [facility] ORS;  Service: Orthopedics;  Laterality: Left;    Social History:  reports that she quit smoking about 35 years ago. Her smoking use included cigarettes. She has a 6.25 pack-year smoking history. She has never used smokeless tobacco. She reports current alcohol use. She reports that she does not use drugs.  Allergies: Allergies  Allergen Reactions  . Penicillins Other (See Comments)    Has patient had a PCN reaction causing immediate rash, facial/tongue/throat swelling, SOB or lightheadedness with hypotension:No Has patient had a PCN reaction causing severe rash involving mucus membranes or skin necrosis:No Has patient had a PCN reaction that required hospitalization:No Has patient had a PCN reaction occurring within the last 10 years:No If all of the above answers are "NO", then may proceed with Cephalosporin use. Urinary issues--pt.says "felt like razor blades in her bladder"  . Pneumococcal Vaccines Hives, Itching and Rash    Family History:  Family History  Problem Relation Age of Onset  . Heart disease Mother        died age 81  . CAD Brother 65     Current Outpatient Medications:  .  aspirin 325 MG tablet, Take 1 tablet (325 mg total) by mouth daily., Disp: 30 tablet, Rfl: 0 .  atorvastatin (LIPITOR) 40 MG tablet, Take 1 tablet (40 mg total) by mouth daily at 6 PM., Disp: 30 tablet, Rfl: 0 .  clopidogrel (PLAVIX) 75 MG tablet, Take 1 tablet (75 mg total) by mouth daily., Disp: 90 tablet, Rfl: 0 .  latanoprost (XALATAN) 0.005 % ophthalmic solution, Place 1 drop into the right eye daily., Disp: , Rfl:  .  lisinopril (ZESTRIL) 10 MG tablet, Take 1 tablet (10 mg total) by mouth daily., Disp: 30 tablet, Rfl: 0  Review of Systems:  Constitutional: Denies fever, chills, diaphoresis, appetite change and fatigue.  HEENT: Denies photophobia, eye pain, redness, hearing loss, ear pain, congestion, sore throat, rhinorrhea, sneezing, mouth sores, trouble swallowing, neck  pain, neck stiffness and tinnitus.   Respiratory: Denies SOB, DOE, cough, chest tightness,  and wheezing.   Cardiovascular: Denies chest pain, palpitations and leg swelling.  Gastrointestinal: Denies nausea, vomiting, abdominal pain, diarrhea, constipation, blood in stool and abdominal distention.  Genitourinary: Denies dysuria, urgency, frequency, hematuria, flank pain and difficulty urinating.  Endocrine: Denies: hot or cold intolerance, sweats, changes in hair or nails, polyuria, polydipsia. Musculoskeletal: Denies myalgias, back pain, joint swelling, arthralgias and gait problem.  Skin: Denies pallor, rash and wound.  Neurological: Denies dizziness, seizures, syncope,  light-headedness, numbness and headaches.  Hematological: Denies adenopathy. Easy bruising, personal or family bleeding history  Psychiatric/Behavioral: Denies suicidal ideation, mood changes, confusion, nervousness, sleep disturbance and agitation    Physical Exam: Vitals:   03/11/20 1132  BP: 120/70  Pulse: 66  Temp: (!) 97.4 F (36.3 C)  TempSrc: Temporal  SpO2: 96%  Weight: 103 lb 3.2 oz (46.8 kg)  Height: 5' 1.25" (1.556 m)   Body mass index is 19.34 kg/m.  Constitutional: NAD, calm, comfortable Eyes: PERRL, lids and conjunctivae normal ENMT: Mucous membranes are moist.  Neck: normal, supple, no masses, no thyromegaly Respiratory: clear to auscultation bilaterally, no wheezing, no crackles. Normal respiratory effort. No accessory muscle use.  Cardiovascular: Regular rate and rhythm, no murmurs / rubs / gallops. No extremity edema. 2+ pedal pulses. No carotid bruits.  Psychiatric: Normal judgment and insight. Alert and oriented x 3. Normal mood.    Impression and Plan:  Acute ischemic stroke ([place]) -Work-up in the hospital was significant for an LDL of 206, she is on atorvastatin 40 mg. -In the hospital she also had a loop recorder implanted.  Has follow-up with cardiology and neurology soon. -She  remains on aspirin and Plavix for 3 months with plans to take off Plavix at that time and continue on aspirin alone unless loop recorder were to find evidence of atrial fibrillation.  Hyperlipidemia, unspecified hyperlipidemia type -Last LDL was 206 in March 2021 with goal less than 70 -Continue atorvastatin 40 mg and recheck lipids in 3 months.     Patient Instructions  -Nice seeing you today!!  -Please schedule follow up in 3 months.     Lelon Frohlich, MD [facility]

## 2020-03-11 NOTE — Telephone Encounter (Signed)
Ok to order 

## 2020-03-11 NOTE — Telephone Encounter (Signed)
Ok for ST orders. Medications discussed during appointment today.

## 2020-03-11 NOTE — Telephone Encounter (Signed)
Threasa Alpha from [facility] is needing Verbal orders for the patient  Frequency 1 week 1 2 week 1 1 week 4   Please contact Threasa Alpha at: [phone]

## 2020-03-11 NOTE — Telephone Encounter (Signed)
Verbal orders given to [name] .

## 2020-03-12 NOTE — Telephone Encounter (Signed)
Attempted to call. But unable to leave a message.

## 2020-03-15 ENCOUNTER — Ambulatory Visit: Payer: PPO | Admitting: Student

## 2020-03-15 ENCOUNTER — Other Ambulatory Visit: Payer: Self-pay

## 2020-03-15 DIAGNOSIS — I639 Cerebral infarction, unspecified: Secondary | ICD-10-CM

## 2020-03-15 LAB — CUP PACEART INCLINIC DEVICE CHECK
Date Time Interrogation Session: 20210329114902
Implantable Pulse Generator Implant Date: 20210319

## 2020-03-15 NOTE — Progress Notes (Signed)
ILR wound check in clinic. Steri strips removed. Wound well healed. Home monitor transmitting nightly. No episodes. Questions answered.  

## 2020-03-16 ENCOUNTER — Telehealth: Payer: Self-pay | Admitting: Internal Medicine

## 2020-03-16 NOTE — Telephone Encounter (Signed)
Over the weekend patient had injury to right dorsal hand  Unclear if this was a fall or if hand banged into a wall.  She has moderate bruising with no pain.  Continuing to access for potential neglect on right side.  Just an Harlow Mares [facility]ARE [phone]

## 2020-03-16 NOTE — Telephone Encounter (Signed)
3rd attempt to call, but unable to leave a message

## 2020-03-18 ENCOUNTER — Telehealth: Payer: Self-pay | Admitting: *Deleted

## 2020-03-18 DIAGNOSIS — M19012 Primary osteoarthritis, left shoulder: Secondary | ICD-10-CM | POA: Diagnosis not present

## 2020-03-18 DIAGNOSIS — K5900 Constipation, unspecified: Secondary | ICD-10-CM | POA: Diagnosis not present

## 2020-03-18 DIAGNOSIS — E785 Hyperlipidemia, unspecified: Secondary | ICD-10-CM | POA: Diagnosis not present

## 2020-03-18 DIAGNOSIS — M47816 Spondylosis without myelopathy or radiculopathy, lumbar region: Secondary | ICD-10-CM | POA: Diagnosis not present

## 2020-03-18 DIAGNOSIS — M19011 Primary osteoarthritis, right shoulder: Secondary | ICD-10-CM | POA: Diagnosis not present

## 2020-03-18 DIAGNOSIS — Z96641 Presence of right artificial hip joint: Secondary | ICD-10-CM | POA: Diagnosis not present

## 2020-03-18 DIAGNOSIS — Z87891 Personal history of nicotine dependence: Secondary | ICD-10-CM | POA: Diagnosis not present

## 2020-03-18 DIAGNOSIS — I69351 Hemiplegia and hemiparesis following cerebral infarction affecting right dominant side: Secondary | ICD-10-CM | POA: Diagnosis not present

## 2020-03-18 DIAGNOSIS — I10 Essential (primary) hypertension: Secondary | ICD-10-CM | POA: Diagnosis not present

## 2020-03-18 DIAGNOSIS — Z7902 Long term (current) use of antithrombotics/antiplatelets: Secondary | ICD-10-CM | POA: Diagnosis not present

## 2020-03-18 DIAGNOSIS — Z7982 Long term (current) use of aspirin: Secondary | ICD-10-CM | POA: Diagnosis not present

## 2020-03-18 DIAGNOSIS — I69320 Aphasia following cerebral infarction: Secondary | ICD-10-CM | POA: Diagnosis not present

## 2020-03-18 DIAGNOSIS — Z9181 History of falling: Secondary | ICD-10-CM | POA: Diagnosis not present

## 2020-03-18 DIAGNOSIS — I69392 Facial weakness following cerebral infarction: Secondary | ICD-10-CM | POA: Diagnosis not present

## 2020-03-18 NOTE — Telephone Encounter (Signed)
[name] - nurse with [name] - calling to inform Dr Ardyth Harps that the patient states she feel two days ago.  No pain and no injuries.   fyi

## 2020-03-18 NOTE — Telephone Encounter (Signed)
Verbal orders for PT given to Threasa Alpha from [facility]  [phone]

## 2020-03-22 ENCOUNTER — Telehealth: Payer: Self-pay | Admitting: Internal Medicine

## 2020-03-22 NOTE — Telephone Encounter (Signed)
FYI  Per [place] home health nurse Threasa Alpha, the patient was discharged per pt request, neurorehabilitation recommended for the patient  -the patient declined at this time. The patient has a  bruised on her right hand has now an opened  scab over, but no sign of infection, any question, please contact Jim 336 [phone] [facility]

## 2020-03-26 ENCOUNTER — Telehealth: Payer: Self-pay | Admitting: *Deleted

## 2020-03-26 NOTE — Telephone Encounter (Signed)
[name] called the after hours line to report a injury for  patient. [name] states that the patient was walking (had a previous bruise on her hand) hit her hand and bruised it again.

## 2020-03-29 ENCOUNTER — Telehealth: Payer: Self-pay | Admitting: Internal Medicine

## 2020-03-29 DIAGNOSIS — I639 Cerebral infarction, unspecified: Secondary | ICD-10-CM

## 2020-03-29 NOTE — Telephone Encounter (Signed)
Discharging from Home Health  Recommending speech therapy, OT and PT as soon as possible to get started at the out patient setting.  [phone] [name]  [facility]

## 2020-03-29 NOTE — Telephone Encounter (Signed)
Pt's daughter, Lynnell Dike, is requesting to speak to you regarding refilling medication and a referral for therapy. Susie, did not know the names of the medications and said, that Dr. Ardyth Harps, is aware of which medications the pt needs. Per Susie,            Dr. Ardyth Harps, had mentioned getting pt started on therapy at her last visit. Susie, is aware you are out of the office today and will return on 03/30/20. Thanks

## 2020-03-30 NOTE — Telephone Encounter (Signed)
Left message on machine with verbal orders for speech therapy, OT and PT

## 2020-03-30 NOTE — Telephone Encounter (Signed)
Not clear as to what medications need to be refilled?

## 2020-03-30 NOTE — Telephone Encounter (Signed)
Verbal orders given.  Which medications?

## 2020-03-30 NOTE — Telephone Encounter (Signed)
Ok to place orders

## 2020-03-31 MED ORDER — LISINOPRIL 10 MG PO TABS: 10.0000 mg | ORAL_TABLET | Freq: Every day | ORAL | 1 refills | Status: DC

## 2020-03-31 MED ORDER — CLOPIDOGREL BISULFATE 75 MG PO TABS: 75.0000 mg | ORAL_TABLET | Freq: Every day | ORAL | 1 refills | Status: AC

## 2020-03-31 MED ORDER — ATORVASTATIN CALCIUM 40 MG PO TABS: 40.0000 mg | ORAL_TABLET | Freq: Every day | ORAL | 1 refills | Status: DC

## 2020-03-31 NOTE — Telephone Encounter (Signed)
Refills and pharmacy verified and sent.

## 2020-03-31 NOTE — Addendum Note (Signed)
Addended by: Kern Reap B on: 03/31/2020 10:16 AM   Modules accepted: Orders

## 2020-03-31 NOTE — Telephone Encounter (Signed)
Referral was placed for outpatient OT,PT, and Speech.  Daughter is aware.

## 2020-04-02 ENCOUNTER — Other Ambulatory Visit: Payer: Self-pay | Admitting: Internal Medicine

## 2020-04-02 DIAGNOSIS — R4701 Aphasia: Secondary | ICD-10-CM

## 2020-04-02 DIAGNOSIS — I639 Cerebral infarction, unspecified: Secondary | ICD-10-CM

## 2020-04-05 ENCOUNTER — Telehealth: Payer: Self-pay | Admitting: Internal Medicine

## 2020-04-05 DIAGNOSIS — R278 Other lack of coordination: Secondary | ICD-10-CM | POA: Diagnosis not present

## 2020-04-05 DIAGNOSIS — I639 Cerebral infarction, unspecified: Secondary | ICD-10-CM | POA: Diagnosis not present

## 2020-04-05 DIAGNOSIS — M6281 Muscle weakness (generalized): Secondary | ICD-10-CM | POA: Diagnosis not present

## 2020-04-05 DIAGNOSIS — W19XXXS Unspecified fall, sequela: Secondary | ICD-10-CM | POA: Diagnosis not present

## 2020-04-05 NOTE — Telephone Encounter (Signed)
Heather Nielsen call and would like a call back want to talk about her Mother.the # [phone]/mm

## 2020-04-06 ENCOUNTER — Ambulatory Visit: Payer: PPO | Admitting: Neurology

## 2020-04-06 ENCOUNTER — Encounter: Payer: Self-pay | Admitting: Neurology

## 2020-04-06 ENCOUNTER — Other Ambulatory Visit: Payer: Self-pay

## 2020-04-06 VITALS — BP 156/72 | HR 64 | Temp 97.1°F | Ht 62.0 in | Wt 101.8 lb

## 2020-04-06 DIAGNOSIS — I639 Cerebral infarction, unspecified: Secondary | ICD-10-CM | POA: Diagnosis not present

## 2020-04-06 DIAGNOSIS — I63412 Cerebral infarction due to embolism of left middle cerebral artery: Secondary | ICD-10-CM

## 2020-04-06 DIAGNOSIS — R4701 Aphasia: Secondary | ICD-10-CM

## 2020-04-06 NOTE — Patient Instructions (Signed)
I had a long d/w patient and her daughter about her recent cryptogenic stroke and expressive aphasia, risk for recurrent stroke/TIAs, personally independently reviewed imaging studies and stroke evaluation results and answered questions.Continue aspirin 325 mg daily  for secondary stroke prevention and maintain strict control of hypertension with blood pressure goal below 130/90, diabetes with hemoglobin A1c goal below 6.5% and lipids with LDL cholesterol goal below 70 mg/dL. I also advised the patient to eat a healthy diet with plenty of whole grains, cereals, fruits and vegetables, exercise regularly and maintain ideal body weight .check follow-up lipid profile today.  Patient may also consider possible participation in the [place] trial for stroke prevention if interested.  She will be given information to review and decide.  Followup in the future with Dr. Lucia Gaskins in 3 months as preferred.  Stroke Prevention Some medical conditions and behaviors are associated with a higher chance of having a stroke. You can help prevent a stroke by making nutrition, lifestyle, and other changes, including managing any medical conditions you may have. What nutrition changes can be made?   Eat healthy foods. You can do this by: ? Choosing foods high in fiber, such as fresh fruits and vegetables and whole grains. ? Eating at least 5 or more servings of fruits and vegetables a day. Try to fill half of your plate at each meal with fruits and vegetables. ? Choosing lean protein foods, such as lean cuts of meat, poultry without skin, fish, tofu, beans, and nuts. ? Eating low-fat dairy products. ? Avoiding foods that are high in salt (sodium). This can help lower blood pressure. ? Avoiding foods that have saturated fat, trans fat, and cholesterol. This can help prevent high cholesterol. ? Avoiding processed and premade foods.  Follow your health care provider's specific guidelines for losing weight, controlling high blood  pressure (hypertension), lowering high cholesterol, and managing diabetes. These may include: ? Reducing your daily calorie intake. ? Limiting your daily sodium intake to 1,500 milligrams (mg). ? Using only healthy fats for cooking, such as olive oil, canola oil, or sunflower oil. ? Counting your daily carbohydrate intake. What lifestyle changes can be made?  Maintain a healthy weight. Talk to your health care provider about your ideal weight.  Get at least 30 minutes of moderate physical activity at least 5 days a week. Moderate activity includes brisk walking, biking, and swimming.  Do not use any products that contain nicotine or tobacco, such as cigarettes and e-cigarettes. If you need help quitting, ask your health care provider. It may also be helpful to avoid exposure to secondhand smoke.  Limit alcohol intake to no more than 1 drink a day for nonpregnant women and 2 drinks a day for men. One drink equals 12 oz of beer, 5 oz of wine, or 1 oz of hard liquor.  Stop any illegal drug use.  Avoid taking birth control pills. Talk to your health care provider about the risks of taking birth control pills if: ? You are over 35 years old. ? You smoke. ? You get migraines. ? You have ever had a blood clot. What other changes can be made?  Manage your cholesterol levels. ? Eating a healthy diet is important for preventing high cholesterol. If cholesterol cannot be managed through diet alone, you may also need to take medicines. ? Take any prescribed medicines to control your cholesterol as told by your health care provider.  Manage your diabetes. ? Eating a healthy diet and exercising regularly are  important parts of managing your blood sugar. If your blood sugar cannot be managed through diet and exercise, you may need to take medicines. ? Take any prescribed medicines to control your diabetes as told by your health care provider.  Control your hypertension. ? To reduce your risk of  stroke, try to keep your blood pressure below 130/80. ? Eating a healthy diet and exercising regularly are an important part of controlling your blood pressure. If your blood pressure cannot be managed through diet and exercise, you may need to take medicines. ? Take any prescribed medicines to control hypertension as told by your health care provider. ? Ask your health care provider if you should monitor your blood pressure at home. ? Have your blood pressure checked every year, even if your blood pressure is normal. Blood pressure increases with age and some medical conditions.  Get evaluated for sleep disorders (sleep apnea). Talk to your health care provider about getting a sleep evaluation if you snore a lot or have excessive sleepiness.  Take over-the-counter and prescription medicines only as told by your health care provider. Aspirin or blood thinners (antiplatelets or anticoagulants) may be recommended to reduce your risk of forming blood clots that can lead to stroke.  Make sure that any other medical conditions you have, such as atrial fibrillation or atherosclerosis, are managed. What are the warning signs of a stroke? The warning signs of a stroke can be easily remembered as BEFAST.  B is for balance. Signs include: ? Dizziness. ? Loss of balance or coordination. ? Sudden trouble walking.  E is for eyes. Signs include: ? A sudden change in vision. ? Trouble seeing.  F is for face. Signs include: ? Sudden weakness or numbness of the face. ? The face or eyelid drooping to one side.  A is for arms. Signs include: ? Sudden weakness or numbness of the arm, usually on one side of the body.  S is for speech. Signs include: ? Trouble speaking (aphasia). ? Trouble understanding.  T is for time. ? These symptoms may represent a serious problem that is an emergency. Do not wait to see if the symptoms will go away. Get medical help right away. Call your local emergency services  (911 in the [place] Do not drive yourself to the hospital.  Other signs of stroke may include: ? A sudden, severe headache with no known cause. ? Nausea or vomiting. ? Seizure. Where to find more information For more information, visit:  American Stroke Association: www.strokeassociation.org  National Stroke Association: www.stroke.org Summary  You can prevent a stroke by eating healthy, exercising, not smoking, limiting alcohol intake, and managing any medical conditions you may have.  Do not use any products that contain nicotine or tobacco, such as cigarettes and e-cigarettes. If you need help quitting, ask your health care provider. It may also be helpful to avoid exposure to secondhand smoke.  Remember BEFAST for warning signs of stroke. Get help right away if you or a loved one has any of these signs. This information is not intended to replace advice given to you by your health care provider. Make sure you discuss any questions you have with your health care provider. Document Revised: 11/16/2017 Document Reviewed: 01/09/2017 Elsevier Patient Education  2020 [employer].

## 2020-04-06 NOTE — Progress Notes (Signed)
[facility] [address] [place]. [place] [address] [phone]       OFFICE CONSULT NOTE  Heather Nielsen Date of Birth:  Oct 21, 1934 Medical Record Number:  [id number]   Referring MD: Dr. Lucia Gaskins  Reason for Referral: Stroke second opinion  HPI: Heather Nielsen is a pleasant 86 year old Caucasian lady seen today for initial consultation visit for second opinion for stroke.  History is obtained from the patient and her daughter as well as review of electronic medical records and personal review of imaging films in PACS.  She has a past medical history of arthritis, hypertension hyperlipidemia and presented on 03/04/2020 to [facility] with sudden onset of expressive aphasia and difficulty speaking.  She was last known normal the known prior day.  CT scan of the head on admission showed a moderate size left MCA infarct with a aspect score of 6.  CT angiogram demonstrated left M1 occlusion however CT perfusion showed only underestimated small core of 5 mL with the penumbra of 94 mL however back she will estimated core was at least 50 mL and hence patient was not considered a candidate for intervention.  NIH stroke scale on admission was 3-4 aphasia and 1 for not answering questions.  She had been quite independent at baseline was living alone.  2D echo showed normal ejection fraction but dilated left atrium.  LDL cholesterol was elevated at 206 mg percent and hemoglobin A1c at 5.8.  She was started on dual antiplatelet therapy for 3 weeks followed by aspirin alone and on Lipitor 40 mg daily.  Patient has finished home therapy and is currently about to start outpatient speech and occupational therapy.  Her right hand weakness and clumsiness has improved.  She is able to ambulate independently without assistance but does have some slight dragging of the right leg.  Her speech is only slightly better she still has a lot of word hesitancy and cannot talk fluently in sentences.  Social  speech is much improved.  She still has trouble naming repetition and with word finding.  She is tolerating Lipitor well without muscle aches and pains.  Her blood pressure remains slightly elevated and today it is 156/72.  She denies any prior history of atrial fibrillation, palpitations or syncope.  She had loop recorder inserted and so for paroxysmal A. fib has not yet been found.  ROS:   14 system review of systems is positive for speech difficulties, word finding difficulty, and weakness, dragging of the leg, gait difficulty and all other systems negative  PMH:  Past Medical History:  Diagnosis Date  . Arthritis   . Complication of anesthesia   . PONV (postoperative nausea and vomiting)   . Stroke (HCC)   . Urinary incontinence     Social History:  Social History   Socioeconomic History  . Marital status: Widowed    Spouse name: Not on file  . Number of children: 5  . Years of education: Not on file  . Highest education level: Not on file  Occupational History  . Not on file  Tobacco Use  . Smoking status: Former Smoker    Packs/day: 0.25    Years: 25.00    Pack years: 6.25    Types: Cigarettes    Quit date: 12/18/1984    Years since quitting: 35.3  . Smokeless tobacco: Never Used  Substance and Sexual Activity  . Alcohol use: Yes    Alcohol/week: 0.0 standard drinks    Comment: gin  and tonic and glass of wine daily   . Drug use: No  . Sexual activity: Not on file  Other Topics Concern  . Not on file  Social History Narrative   Lives with husband who has a memory disorder.    Social Determinants of Health   Financial Resource Strain:   . Difficulty of Paying Living Expenses:   Food Insecurity:   . Worried About Programme researcher, broadcasting/film/video in the Last Year:   . Barista in the Last Year:   Transportation Needs:   . Freight forwarder (Medical):   Marland Kitchen Lack of Transportation (Non-Medical):   Physical Activity:   . Days of Exercise per Week:   . Minutes of  Exercise per Session:   Stress:   . Feeling of Stress :   Social Connections:   . Frequency of Communication with Friends and Family:   . Frequency of Social Gatherings with Friends and Family:   . Attends Religious Services:   . Active Member of Clubs or Organizations:   . Attends Banker Meetings:   Marland Kitchen Marital Status:   Intimate Partner Violence:   . Fear of Current or Ex-Partner:   . Emotionally Abused:   Marland Kitchen Physically Abused:   . Sexually Abused:     Medications:   Current Outpatient Medications on File Prior to Visit  Medication Sig Dispense Refill  . aspirin 325 MG tablet Take 1 tablet (325 mg total) by mouth daily. 30 tablet 0  . atorvastatin (LIPITOR) 40 MG tablet Take 1 tablet (40 mg total) by mouth daily at 6 PM. 90 tablet 1  . clopidogrel (PLAVIX) 75 MG tablet Take 1 tablet (75 mg total) by mouth daily. 90 tablet 1  . latanoprost (XALATAN) 0.005 % ophthalmic solution Place 1 drop into the right eye daily.    Marland Kitchen lisinopril (ZESTRIL) 10 MG tablet Take 1 tablet (10 mg total) by mouth daily. 90 tablet 1   No current facility-administered medications on file prior to visit.    Allergies:   Allergies  Allergen Reactions  . Penicillins Other (See Comments)    Has patient had a PCN reaction causing immediate rash, facial/tongue/throat swelling, SOB or lightheadedness with hypotension:No Has patient had a PCN reaction causing severe rash involving mucus membranes or skin necrosis:No Has patient had a PCN reaction that required hospitalization:No Has patient had a PCN reaction occurring within the last 10 years:No If all of the above answers are "NO", then may proceed with Cephalosporin use. Urinary issues--pt.says "felt like razor blades in her bladder"  . Pneumococcal Vaccines Hives, Itching and Rash    Physical Exam General: Frail elderly Caucasian lady seated, in no evident distress Head: head normocephalic and atraumatic.   Neck: supple with no carotid or  supraclavicular bruits Cardiovascular: regular rate and rhythm, no murmurs Musculoskeletal: no deformity Skin:  no rash/petichiae Vascular:  Normal pulses all extremities  Neurologic Exam Mental Status: Awake and fully alert.  Moderate expressive aphasia can speak only short sentences in a few words.  Has difficulty with naming, repetition.  Comprehension seems better preserved.  Mood and affect appropriate.  Cranial Nerves: Fundoscopic exam reveals sharp disc margins. Pupils equal, briskly reactive to light. Extraocular movements full without nystagmus. Visual fields full to confrontation. Hearing intact. Facial sensation intact. Face, tongue, palate moves normally and symmetrically.  Motor: Normal bulk and tone. Normal strength in all tested extremity muscles except diminished fine finger movements on the right and orbits left  over right upper extremity.  Fine finger movements are diminished on the right.  Mild weakness of right hip flexors only.. Sensory.: intact to touch , pinprick , position and vibratory sensation.  Coordination: Rapid alternating movements normal in all extremities. Finger-to-nose and heel-to-shin performed accurately bilaterally. Gait and Station: Arises from chair without difficulty. Stance is slightly stooped.. Gait demonstrates normal stride length with mild dragging of the right leg.. Able to heel, toe and tandem walk with mild difficulty.  Reflexes: 1+ and symmetric. Toes downgoing.   NIHSS  3 Modified Rankin  3   ASSESSMENT: 86 year old Caucasian lady with embolic left MCA stroke due to left middle cerebral artery occlusion of cryptogenic etiology in March 2021.  She has significant residual aphasia.  Vascular risk factors of hyperlipidemia hypertension and age only.     PLAN: I had a long d/w patient and her daughter about her recent cryptogenic stroke and expressive aphasia, risk for recurrent stroke/TIAs, personally independently reviewed imaging studies and  stroke evaluation results and answered questions.Continue aspirin 325 mg daily  for secondary stroke prevention and maintain strict control of hypertension with blood pressure goal below 130/90, diabetes with hemoglobin A1c goal below 6.5% and lipids with LDL cholesterol goal below 70 mg/dL. I also advised the patient to eat a healthy diet with plenty of whole grains, cereals, fruits and vegetables, exercise regularly and maintain ideal body weight .check follow-up lipid profile today.  Patient may also consider possible participation in the [place] trial for stroke prevention if interested.  She will be given information to review and decide.  Greater than 50% time during this 50-minute consultation visit was spent on counseling and coordination of care about her cryptogenic stroke and discussion about prevention and treatment and answering questions.  Followup in the future with Dr. Jaynee Eagles in 3 months as preferred. Antony Contras, MD  [facility] [address] [place], [place] [address]  Phone [phone] Fax [phone] Note: This document was prepared with digital dictation and possible smart phrase technology. Any transcriptional errors that result from this process are unintentional.

## 2020-04-06 NOTE — Telephone Encounter (Signed)
Left message on machine returning daughters call.  Okay for daughter to leave more information about the patient.

## 2020-04-07 LAB — LIPID PANEL
Chol/HDL Ratio: 2.8 ratio (ref 0.0–4.4)
Cholesterol, Total: 255 mg/dL — ABNORMAL HIGH (ref 100–199)
HDL: 91 mg/dL (ref >39)
LDL Chol Calc (NIH): 145 mg/dL — ABNORMAL HIGH (ref 0–99)
Triglycerides: 110 mg/dL (ref 0–149)
VLDL Cholesterol Cal: 19 mg/dL (ref 5–40)

## 2020-04-07 NOTE — Progress Notes (Signed)
Kindly inform the patient that bad cholesterol levels are not satisfactory and she needs to increase her dose of Lipitor to full 80 mg daily

## 2020-04-08 ENCOUNTER — Ambulatory Visit (INDEPENDENT_AMBULATORY_CARE_PROVIDER_SITE_OTHER): Payer: PPO | Admitting: *Deleted

## 2020-04-08 DIAGNOSIS — I639 Cerebral infarction, unspecified: Secondary | ICD-10-CM | POA: Diagnosis not present

## 2020-04-08 NOTE — Telephone Encounter (Signed)
Spoke with daughter and nothing further needed at this time.

## 2020-04-09 DIAGNOSIS — R278 Other lack of coordination: Secondary | ICD-10-CM | POA: Diagnosis not present

## 2020-04-09 DIAGNOSIS — I639 Cerebral infarction, unspecified: Secondary | ICD-10-CM | POA: Diagnosis not present

## 2020-04-09 DIAGNOSIS — W19XXXS Unspecified fall, sequela: Secondary | ICD-10-CM | POA: Diagnosis not present

## 2020-04-09 DIAGNOSIS — M6281 Muscle weakness (generalized): Secondary | ICD-10-CM | POA: Diagnosis not present

## 2020-04-09 LAB — CUP PACEART REMOTE DEVICE CHECK
Date Time Interrogation Session: 20210422154342
Implantable Pulse Generator Implant Date: 20210319

## 2020-04-09 NOTE — Progress Notes (Signed)
ILR Remote 

## 2020-04-12 DIAGNOSIS — I639 Cerebral infarction, unspecified: Secondary | ICD-10-CM | POA: Diagnosis not present

## 2020-04-12 DIAGNOSIS — W19XXXS Unspecified fall, sequela: Secondary | ICD-10-CM | POA: Diagnosis not present

## 2020-04-12 DIAGNOSIS — M6281 Muscle weakness (generalized): Secondary | ICD-10-CM | POA: Diagnosis not present

## 2020-04-12 DIAGNOSIS — R278 Other lack of coordination: Secondary | ICD-10-CM | POA: Diagnosis not present

## 2020-04-14 ENCOUNTER — Ambulatory Visit: Payer: PPO | Attending: Internal Medicine | Admitting: Speech Pathology

## 2020-04-14 ENCOUNTER — Telehealth: Payer: Self-pay

## 2020-04-14 ENCOUNTER — Other Ambulatory Visit: Payer: Self-pay

## 2020-04-14 DIAGNOSIS — R4701 Aphasia: Secondary | ICD-10-CM

## 2020-04-14 DIAGNOSIS — R41841 Cognitive communication deficit: Secondary | ICD-10-CM | POA: Diagnosis not present

## 2020-04-14 NOTE — Telephone Encounter (Signed)
I called pts daughter Lynnell Dike on dpr about pts labs. I stated bad cholesterol levels are not satisfactory and she needs to increase her dose of Lipitor to full 80 mg daily per Dr.SethiI. I stated her PCP sent a rx of liptor to 40mg  on 4/142021 to [facility]. I stated this note will be sent to Dr 11-12-1976 of what Dr. Ardyth Harps recommended.I stated to daughter they can call PCP and have them to change prescription to 80ng or she can just take two 40mg  unitl prescription ends. She verbalized understanding.

## 2020-04-14 NOTE — Telephone Encounter (Signed)
-----   Message from Micki Riley, MD sent at 04/07/2020  4:11 PM EDT ----- Heather Nielsen inform the patient that bad cholesterol levels are not satisfactory and she needs to increase her dose of Lipitor to full 80 mg daily

## 2020-04-14 NOTE — Patient Instructions (Signed)
   Tips for Talking with People who have Aphasia  . Say one thing at a time . Don't  rush - slow down, be patient . Talk face to face . Reduce background noise . Relax - be natural . Use pen and paper . Write down key words . Draw diagrams or pictures . Don't pretend you understand . Ask what helps . Recap - check you both understand . Be a partner, not a therapist   Aphasia does not affect intelligence, only language. The person with aphasia can still: make decisions, have opinions, and socialize.   Describing words  What group does it belong to?  What do I use it for?  Where can I find it?  What does it LOOK like?  What other words go with it?  What is the 1st sound of the word?    Many Ways to Communicate  Describe it Write it Draw it Gesture it Use related words  Talk Path app by Sheran Lawless     Provided by: Rolin Barry [address], [phone]

## 2020-04-15 ENCOUNTER — Telehealth: Payer: Self-pay | Admitting: Internal Medicine

## 2020-04-15 DIAGNOSIS — M6281 Muscle weakness (generalized): Secondary | ICD-10-CM | POA: Diagnosis not present

## 2020-04-15 DIAGNOSIS — I639 Cerebral infarction, unspecified: Secondary | ICD-10-CM | POA: Diagnosis not present

## 2020-04-15 DIAGNOSIS — R278 Other lack of coordination: Secondary | ICD-10-CM | POA: Diagnosis not present

## 2020-04-15 DIAGNOSIS — W19XXXS Unspecified fall, sequela: Secondary | ICD-10-CM | POA: Diagnosis not present

## 2020-04-15 NOTE — Telephone Encounter (Signed)
error 

## 2020-04-15 NOTE — Telephone Encounter (Signed)
Pt daughter would like a call back about pt medication. It seems to be some discrepancies with Dr. Ardyth Harps and pt nero dr.

## 2020-04-16 ENCOUNTER — Encounter: Payer: Self-pay | Admitting: Speech Pathology

## 2020-04-16 NOTE — Telephone Encounter (Signed)
Left message on machine returning daughter's call. More information needed. 

## 2020-04-16 NOTE — Therapy (Signed)
[facility] [address] [place], [place], [address] Phone: [phone]   Fax:  [phone]  Speech Language Pathology Evaluation  Patient Details  Name: Heather Nielsen MRN: [id number] Date of Birth: 1934-11-06 Referring Provider (SLP): Dr. Lelon Frohlich   Encounter Date: 04/14/2020  End of Session - 04/16/20 0629    Visit Number  1    Number of Visits  17    Date for SLP Re-Evaluation  06/11/20    Authorization Type  required    SLP Start Time  0847    SLP Stop Time   0932    SLP Time Calculation (min)  45 min    Activity Tolerance  Patient tolerated treatment well       Past Medical History:  Diagnosis Date  . Arthritis   . Complication of anesthesia   . PONV (postoperative nausea and vomiting)   . Stroke ([place])   . Urinary incontinence     Past Surgical History:  Procedure Laterality Date  . KNEE SURGERY  2007   menicus   . LOOP RECORDER INSERTION N/A 03/05/2020   Procedure: LOOP RECORDER INSERTION;  Surgeon: Thompson Grayer, MD;  Location: [facility];  Service: Cardiovascular;  Laterality: N/A;  . ROTATOR CUFF REPAIR Right 2006  . TOTAL HIP ARTHROPLASTY Right 06/16/2015   Procedure: RIGHT TOTAL HIP ARTHROPLASTY ANTERIOR APPROACH;  Surgeon: Gaynelle Arabian, MD;  Location: [facility];  Service: Orthopedics;  Laterality: Right;  . TOTAL HIP ARTHROPLASTY Left 09/06/2016   Procedure: LEFT TOTAL HIP ARTHROPLASTY ANTERIOR APPROACH;  Surgeon: Gaynelle Arabian, MD;  Location: [facility];  Service: Orthopedics;  Laterality: Left;    There were no vitals filed for this visit.  Subjective Assessment - 04/16/20 0618    Subjective  "Tammy will be coming with her" re: personal care attendant    Patient is accompained by:  Family member   daughter, [name] and attendant, Tammy   Currently in Pain?  No/denies         SLP Evaluation [facility] - 04/16/20 3716      SLP Visit Information   SLP Received On  04/14/20     Referring Provider (SLP)  Dr. Lelon Frohlich    Onset Date  03/03/20    Medical Diagnosis  L MCA CVA      Subjective   Patient/Family Stated Goal  "The speech"      General Information   HPI   Heather Nielsen is 86 y.o female with  a past medical history of arthritis, hypertension hyperlipidemia and presented on 03/04/2020 to [facility] with sudden onset of expressive aphasia and difficulty speaking.  She was last known normal the known prior day.  CT scan of the head on admission showed a moderate size left MCA infarct with a aspect score of 6.  She was hospitalized 03/04/20-03/06/20. She received several visits with HHST.     Mobility Status  walks independently      Balance Screen   Has the patient fallen in the past 6 months  Yes    How many times?  1x    Has the patient had a decrease in activity level because of a fear of falling?   No    Is the patient reluctant to leave their home because of a fear of falling?   No      Prior Functional Status   Cognitive/Linguistic Baseline  Within functional limits    Type of Home  Apartment     Lives With  Alone    Available Support  Family;Personal care attendant    Vocation  Retired      Cognition   Overall Cognitive Status  Impaired/Different from baseline    Area of Impairment  Attention;Memory;Awareness;Problem solving    Current Attention Level  Selective    Problem Solving Comments  difficulty sequencing use of phone      Auditory Comprehension   Overall Auditory Comprehension  Impaired    Yes/No Questions  Within Functional Limits    Commands  Impaired    Complex Commands  75-100% accurate    Conversation  Simple    Interfering Components  Attention;Processing speed    EffectiveTechniques  Extra processing time;Pausing;Repetition;Slowed speech      Reading Comprehension   Reading Status  Not tested      Expression   Primary Mode of Expression  Verbal      Verbal Expression   Overall Verbal Expression   Impaired    Initiation  No impairment    Automatic Speech  --   WFL   Level of Generative/Spontaneous Verbalization  Conversation    Repetition  Impaired    Level of Impairment  Sentence level    Naming  Impairment    Responsive  76-100% accurate    Confrontation  50-74% accurate    Convergent  75-100% accurate    Divergent  50-74% accurate    Verbal Errors  Semantic paraphasias    Pragmatics  No impairment    Effective Techniques  Sentence completion      Written Expression   Dominant Hand  Right    Written Expression  Exceptions to [facility]    Dictation Ability  Phrase      Oral Motor/Sensory Function   Overall Oral Motor/Sensory Function  Impaired    Labial ROM  Reduced right    Labial Strength  Within Functional Limits    Labial Sensation  Within Functional Limits    Labial Coordination  WFL    Lingual ROM  Within Functional Limits    Lingual Symmetry  Within Functional Limits    Lingual Strength  Within Functional Limits    Facial Symmetry  Within Functional Limits    Velum  Within Functional Limits    Mandible  Within Functional Limits      Motor Speech   Overall Motor Speech  Appears within functional limits for tasks assessed      Standardized Assessments   Standardized Assessments   Western Aphasia Battery revised    Western Aphasia Battery revised   78.8/100                      SLP Education - 04/16/20 [phone]    Education Details  aphasia ed, goals, talk path app    Person(s) Educated  Patient;Child(ren);Caregiver(s)    Methods  Explanation;Verbal cues;Handout    Comprehension  Verbal cues required;Need further instruction       SLP Short Term Goals - 04/16/20 8841      SLP SHORT TERM GOAL #1   Title  Pt will name 10 items for moderately complex category with occasional min A over 2 sessions    Time  4    Period  Weeks    Status  New      SLP SHORT TERM GOAL #2   Title  Pt will utilize compensations for aphasia in structured language  tasks with occasional min A over 2 sessions  Time  4    Period  Weeks    Status  New      SLP SHORT TERM GOAL #3   Title  Pt will utilize compensations for aphasia during 10 minute simple conversation as needed with occasional min A over 2 sessions    Time  4    Period  Weeks    Status  New       SLP Long Term Goals - 04/16/20 2440      SLP LONG TERM GOAL #1   Title  Pt will utilize compensations for aphasia duirng 25 minute moderately complex conversation with rare min A over 2 sessions    Time  8    Period  Weeks    Status  New      SLP LONG TERM GOAL #2   Title  Pt will call 3 family members using her smart phone with rare min A over 2 sessions    Time  8    Period  Weeks    Status  New      SLP LONG TERM GOAL #3   Title  Pt will ID and correct written errors at sentence level with occasional min A (texting, email or writing)    Time  8    Period  Weeks    Status  New       Plan - 04/16/20 0630    Clinical Impression Statement  Mariya Evers presents today with moderate aphasia, most like transcortical motor. Receptive language is relatively intact, however compreehnsion of complex information such as medical or insurance information is impaired. Discourse exhibts intact syntax with lack of content words. Empty speech with perponderance of vague substitutions for nouns, such as "something" "it" and fillers. Danyetta is aware of her errors and states frequently "I can't say it" or "I can't tell" in naming tasks. She reports daily frustration with difficulty communicating with her family. Her daughter affirms cognitive impairments as well, she note her mom has difficulty sequencing tasks, and that she is not able to use her phone since the stroke. Douglas is also accompanied by a perosnal care attendant who will be attending ST sessions. I recommend skilled ST to maximize communication for safety and QOL and to reduce caregiver burdens. Cognitive goals may be added as indicated.     Speech Therapy Frequency  2x / week    Duration  --   8 weeks or 17 visits   Treatment/Interventions  Language facilitation;Environmental controls;SLP instruction and feedback;Compensatory strategies;Functional tasks;Cognitive reorganization;Compensatory techniques;Multimodal communcation approach;Internal/external aids;Patient/family education    Potential to Achieve Goals  Good       Patient will benefit from skilled therapeutic intervention in order to improve the following deficits and impairments:   Aphasia  Cognitive communication deficit    Problem List Patient Active Problem List   Diagnosis Date Noted  . Hyperlipidemia 03/11/2020  . Aphasia   . Acute ischemic stroke ([facility]) 03/03/2020  . OA (osteoarthritis) of hip 06/16/2015  . Osteoarthritis of right hip 12/03/2014  . Routine general medical examination at a health care facility 12/03/2014    Sukhdeep Wieting, Radene Journey MS, CCC-SLP 04/16/2020, 6:42 AM  [facility] [address] [place], [place], [address] Phone: [phone]   Fax:  [phone]  Name: SHATINA STREETS MRN: [id number] Date of Birth: Nov 08, 1934

## 2020-04-19 DIAGNOSIS — R278 Other lack of coordination: Secondary | ICD-10-CM | POA: Diagnosis not present

## 2020-04-19 DIAGNOSIS — I639 Cerebral infarction, unspecified: Secondary | ICD-10-CM | POA: Diagnosis not present

## 2020-04-19 DIAGNOSIS — M6281 Muscle weakness (generalized): Secondary | ICD-10-CM | POA: Diagnosis not present

## 2020-04-19 DIAGNOSIS — W19XXXS Unspecified fall, sequela: Secondary | ICD-10-CM | POA: Diagnosis not present

## 2020-04-19 NOTE — Telephone Encounter (Signed)
Patient's daughter called to talk to Fleet Contras about the patients medications.    [phone]

## 2020-04-20 ENCOUNTER — Ambulatory Visit: Payer: PPO | Attending: Internal Medicine

## 2020-04-20 ENCOUNTER — Other Ambulatory Visit: Payer: Self-pay

## 2020-04-20 DIAGNOSIS — R41841 Cognitive communication deficit: Secondary | ICD-10-CM | POA: Insufficient documentation

## 2020-04-20 DIAGNOSIS — R4701 Aphasia: Secondary | ICD-10-CM

## 2020-04-20 NOTE — Patient Instructions (Signed)
   Compensations for word finding:  1) Describe the thing  2) Say a word that means the same thing  3) Rephrase the sentence to use another word you can say  4) Gesture or draw  ================================================  TalkPath app - for use on the iPad to help talking and understanding

## 2020-04-20 NOTE — Therapy (Signed)
[facility] [address] [place], [place], [address] Phone: [phone]   Fax:  [phone]  Speech Language Pathology Treatment  Patient Details  Name: Heather Nielsen MRN: [id number] Date of Birth: [birth year] Referring Provider (SLP): Dr. Chaya Jan   Encounter Date: 04/20/2020  End of Session - 04/20/20 1637    Visit Number  2    Number of Visits  17    Date for SLP Re-Evaluation  06/11/20    SLP Start Time  1106    SLP Stop Time   1146    SLP Time Calculation (min)  40 min    Activity Tolerance  Patient tolerated treatment well       Past Medical History:  Diagnosis Date  . Arthritis   . Complication of anesthesia   . PONV (postoperative nausea and vomiting)   . Stroke ([facility])   . Urinary incontinence     Past Surgical History:  Procedure Laterality Date  . KNEE SURGERY  2007   menicus   . LOOP RECORDER INSERTION N/A 03/05/2020   Procedure: LOOP RECORDER INSERTION;  Surgeon: Hillis Range, MD;  [facility];  Service: Cardiovascular;  Laterality: N/A;  . ROTATOR CUFF REPAIR Right 2006  . TOTAL HIP ARTHROPLASTY Right 06/16/2015   Procedure: RIGHT TOTAL HIP ARTHROPLASTY ANTERIOR APPROACH;  Surgeon: Ollen Gross, MD;  Location: [facility];  Service: Orthopedics;  Laterality: Right;  . TOTAL HIP ARTHROPLASTY Left 09/06/2016   Procedure: LEFT TOTAL HIP ARTHROPLASTY ANTERIOR APPROACH;  Surgeon: Ollen Gross, MD;  [facility];  Service: Orthopedics;  Laterality: Left;    There were no vitals filed for this visit.  Subjective Assessment - 04/20/20 1113    Subjective  "I don't have any hobbies."    Patient is accompained by:  --   Tammy, personal attendant   Currently in Pain?  No/denies            ADULT SLP TREATMENT - 04/20/20 1117      General Information   Behavior/Cognition  Alert;Cooperative      Treatment Provided   Treatment provided  Cognitive-Linquistic      Cognitive-Linquistic Treatment   Treatment focused on  Aphasia;Cognition    Skilled Treatment  SLP educated pt and aide on compensations for anomia. SLP rephrased explanations as pt's facial expression indicated misunderstanding of SLP message. SLP provided pt handout with compenstory strategies. SLP engaged pt with simple naming task of restaurants around her and pt told SLP [employer] with phonemic paraphasia but certainly functional. When SLP asked pt for another pt stated repeatedly "I don't know." SLP encouraged pt to use compensations just reviewed and pt functionally communicated another restaurant very close to [employer] ("beh-before, across it from it"), so SLP drew diagram of [employer] area and SLP provided phonemic cues for pt to name [employer]") and [employer]") after telling SLP "I don't know" prior to SLP phoneic cues and pt naming each of these. SLP told pt directly that sometimes she needs to make attempts at naming even if she thinks it might be incorrect. Educated attendant that she could also use semantic cues and phonemic cues if she knows for certain what word pt wants to say. Provided talkpath app name for attendant to tell daughter to set up for pt. Pt stated she "does not allow" her children to call her due to aphasia - SLP told pt she needs to practice talking to regain her talking.  Assessment / Recommendations / Plan   Plan  Continue with current plan of care      Progression Toward Goals   Progression toward goals  Not progressing toward goals (comment)   pt hiding deficits, unwilling to attempt talking      SLP Education - 04/20/20 1636    Education Details  have to talk to recover talking, pt shoud talk to family instead of shunning their advances to talk with her    Person(s) Educated  Patient;Caregiver(s)    Methods  Explanation    Comprehension  Verbalized understanding       SLP Short Term Goals - 04/20/20 1645      SLP SHORT TERM GOAL #1   Title   Pt will name 10 items for moderately complex category with occasional min A over 2 sessions    Time  4    Period  Weeks    Status  On-going      SLP SHORT TERM GOAL #2   Title  Pt will utilize compensations for aphasia in structured language tasks with occasional min A over 2 sessions    Time  4    Period  Weeks    Status  On-going      SLP SHORT TERM GOAL #3   Title  Pt will utilize compensations for aphasia during 10 minute simple conversation as needed with occasional min A over 2 sessions    Time  4    Period  Weeks    Status  On-going       SLP Long Term Goals - 04/20/20 1645      SLP LONG TERM GOAL #1   Title  Pt will utilize compensations for aphasia duirng 25 minute moderately complex conversation with rare min A over 2 sessions    Time  8    Period  Weeks    Status  On-going      SLP LONG TERM GOAL #2   Title  Pt will call 3 family members using her smart phone with rare min A over 2 sessions    Time  8    Period  Weeks    Status  On-going      SLP LONG TERM GOAL #3   Title  Pt will ID and correct written errors at sentence level with occasional min A (texting, email or writing)    Time  8    Period  Weeks    Status  On-going       Plan - 04/20/20 1639    Clinical Impression Statement  Heather Nielsen presents today with moderate aphasia, most like transcortical motor. Receptive language is relatively intact, however compreehnsion of complex information such as medical or insurance information is impaired. Heather Nielsen demonstrated today she is unwilling to attempt  language tasks instead saying "I don't know", but is sometimes successful when she tries a langauge task. See "skilled intervention" for details. Tammy, pt's perosnal care attendant was instructed today on providing cues for pt to generate expressive language. I recommend cont'd skilled ST to maximize communication for safety and QOL and to reduce caregiver burdens. Cognitive goals may be added as indicated.     Speech Therapy Frequency  2x / week    Duration  --   8 weeks or 17 visits   Treatment/Interventions  Language facilitation;Environmental controls;SLP instruction and feedback;Compensatory strategies;Functional tasks;Cognitive reorganization;Compensatory techniques;Multimodal communcation approach;Internal/external aids;Patient/family education    Potential to Achieve Goals  Good  Patient will benefit from skilled therapeutic intervention in order to improve the following deficits and impairments:   Aphasia  Cognitive communication deficit    Problem List Patient Active Problem List   Diagnosis Date Noted  . Hyperlipidemia 03/11/2020  . Aphasia   . Acute ischemic stroke (HCC) 03/03/2020  . OA (osteoarthritis) of hip 06/16/2015  . Osteoarthritis of right hip 12/03/2014  . Routine general medical examination at a health care facility 12/03/2014    [facility] ,MS, CCC-SLP  04/20/2020, 4:45 PM  [facility] [address] [place], [place], [address] Phone: [phone]   Fax:  [phone]   Name: Heather Nielsen MRN: [id number] Date of Birth: 07-Nov-1934

## 2020-04-22 ENCOUNTER — Ambulatory Visit: Payer: PPO | Admitting: Speech Pathology

## 2020-04-22 ENCOUNTER — Other Ambulatory Visit: Payer: Self-pay

## 2020-04-22 DIAGNOSIS — R4701 Aphasia: Secondary | ICD-10-CM | POA: Diagnosis not present

## 2020-04-22 DIAGNOSIS — M6281 Muscle weakness (generalized): Secondary | ICD-10-CM | POA: Diagnosis not present

## 2020-04-22 DIAGNOSIS — W19XXXS Unspecified fall, sequela: Secondary | ICD-10-CM | POA: Diagnosis not present

## 2020-04-22 DIAGNOSIS — R278 Other lack of coordination: Secondary | ICD-10-CM | POA: Diagnosis not present

## 2020-04-22 DIAGNOSIS — I639 Cerebral infarction, unspecified: Secondary | ICD-10-CM | POA: Diagnosis not present

## 2020-04-22 DIAGNOSIS — R41841 Cognitive communication deficit: Secondary | ICD-10-CM

## 2020-04-22 NOTE — Telephone Encounter (Signed)
Left message on machine returning daughter's call. 

## 2020-04-22 NOTE — Therapy (Signed)
[facility] [address] [place], [place], [address] Phone: [phone]   Fax:  [phone]  Speech Language Pathology Treatment  Patient Details  Name: Heather Nielsen MRN: [id number] Date of Birth: August 22, 1934 Referring Provider (SLP): Dr. Chaya Jan   Encounter Date: 04/22/2020  End of Session - 04/22/20 1808    Visit Number  3    Number of Visits  17    Date for SLP Re-Evaluation  06/11/20    Authorization Type  required    Authorization Time Period  15 visits approved 5/4-7/3    Authorization - Visit Number  2    Authorization - Number of Visits  15    SLP Start Time  1447    SLP Stop Time   1530    SLP Time Calculation (min)  43 min    Activity Tolerance  Patient tolerated treatment well       Past Medical History:  Diagnosis Date  . Arthritis   . Complication of anesthesia   . PONV (postoperative nausea and vomiting)   . Stroke ([facility])   . Urinary incontinence     Past Surgical History:  Procedure Laterality Date  . KNEE SURGERY  April 15, 2006   menicus   . LOOP RECORDER INSERTION N/A 03/05/2020   Procedure: LOOP RECORDER INSERTION;  Surgeon: Hillis Range, MD;  [facility];  Service: Cardiovascular;  Laterality: N/A;  . ROTATOR CUFF REPAIR Right 15-Apr-2005  . TOTAL HIP ARTHROPLASTY Right 06/16/2015   Procedure: RIGHT TOTAL HIP ARTHROPLASTY ANTERIOR APPROACH;  Surgeon: Ollen Gross, MD;  [facility]: [facility];  Service: Orthopedics;  Laterality: Right;  . TOTAL HIP ARTHROPLASTY Left 09/06/2016   Procedure: LEFT TOTAL HIP ARTHROPLASTY ANTERIOR APPROACH;  Surgeon: Ollen Gross, MD;  [facility]: [facility];  Service: Orthopedics;  Laterality: Left;    There were no vitals filed for this visit.  Subjective Assessment - 04/22/20 1532    Subjective  "It's very... I don't know."    Currently in Pain?  No/denies   Tammy, caregiver           ADULT SLP TREATMENT - 04/22/20 1533      General  Information   Behavior/Cognition  Alert;Cooperative      Treatment Provided   Treatment provided  Cognitive-Linquistic      Pain Assessment   Pain Assessment  No/denies pain      Cognitive-Linquistic Treatment   Treatment focused on  Aphasia;Cognition    Skilled Treatment  Patient arrived with aid Tammy. When encountering anomia in initial conversation, pt stated, "I don't know." SLP affirmed competence, ("I know you know, you are just having a hard time with the word.") SLP reviewed compensations introduced in previous session by SLP Schinke, and reinforced that pt must practice talking with others if she wants to improve. SLP facilitated simple conversation re: pt's family, and likes (the beach). Patient required usual cues to use compensations such as description, and at times she was resistant to doing this. When telling SLP about her husband, who passed away in 04/15/17, she used substitution effectively when she could not generate "memory care," ("He had Alzheimers"). SLP acknowledged and reinforced pt's effective [place] of compensation. Daughter has not had time to load TalkPath onto iPad. Patient adamantly declined activities to practice at home with aide. SLP told aide that having pt do iPad activities may help her build confidence and increase her willingness to practice talking more with others. SLP asked aide to  prioritize access for patient; requested that if not downloaded by then they can bring to session on 5/13 and SLP will assist with setup. Engaged pt in simple TalkPath (level 1 categorization) activity; she participated with rare min cues (100% accuracy).       Assessment / Recommendations / Plan   Plan  Continue with current plan of care      Progression Toward Goals   Progression toward goals  Progressing toward goals   somewhat more receptive to use of compensations today      SLP Education - 04/22/20 1808    Education Details  patient needs to talk with others to improve  talking    Person(s) Educated  Patient   aide   Methods  Explanation    Comprehension  Verbalized understanding       SLP Short Term Goals - 04/22/20 1811      SLP SHORT TERM GOAL #1   Title  Pt will name 10 items for moderately complex category with occasional min A over 2 sessions    Time  4    Period  Weeks    Status  On-going      SLP SHORT TERM GOAL #2   Title  Pt will utilize compensations for aphasia in structured language tasks with occasional min A over 2 sessions    Time  4    Period  Weeks    Status  On-going      SLP SHORT TERM GOAL #3   Title  Pt will utilize compensations for aphasia during 10 minute simple conversation as needed with occasional min A over 2 sessions    Time  4    Period  Weeks    Status  On-going       SLP Long Term Goals - 04/22/20 1811      SLP LONG TERM GOAL #1   Title  Pt will utilize compensations for aphasia duirng 25 minute moderately complex conversation with rare min A over 2 sessions    Time  8    Period  Weeks    Status  On-going      SLP LONG TERM GOAL #2   Title  Pt will call 3 family members using her smart phone with rare min A over 2 sessions    Time  8    Period  Weeks    Status  On-going      SLP LONG TERM GOAL #3   Title  Pt will ID and correct written errors at sentence level with occasional min A (texting, email or writing)    Time  8    Period  Weeks    Status  On-going       Plan - 04/22/20 1809    Clinical Impression Statement  Dawnette Draheim presents today with moderate aphasia, most like transcortical motor. Receptive language is relatively intact, however compreehnsion of complex information such as medical or insurance information is impaired. Dashay is at times unwilling to attempt language tasks instead saying "I don't know", but is sometimes successful when she tries a langauge task. She participated in [place] activity today with min cues, and was more receptive to prompts for description in simple  conversation today.See "skilled intervention" for details. Tammy, pt's perosnal care attendant was instructed today on providing cues for pt to generate expressive language. I recommend cont'd skilled ST to maximize communication for safety and QOL and to reduce caregiver burdens. Cognitive goals may be added as indicated.  Speech Therapy Frequency  2x / week    Duration  --   8 weeks or 17 visits   Treatment/Interventions  Language facilitation;Environmental controls;SLP instruction and feedback;Compensatory strategies;Functional tasks;Cognitive reorganization;Compensatory techniques;Multimodal communcation approach;Internal/external aids;Patient/family education    Potential to Achieve Goals  Good       Patient will benefit from skilled therapeutic intervention in order to improve the following deficits and impairments:   Aphasia  Cognitive communication deficit    Problem List Patient Active Problem List   Diagnosis Date Noted  . Hyperlipidemia 03/11/2020  . Aphasia   . Acute ischemic stroke ([place]) 03/03/2020  . OA (osteoarthritis) of hip 06/16/2015  . Osteoarthritis of right hip 12/03/2014  . Routine general medical examination at a health care facility 12/03/2014   Deneise Lever, [place], [place] 04/22/2020, 6:11 PM  [facility] [address] [place], [place], [address] Phone: [phone]   Fax:  [phone]   Name: SIGNE TACKITT MRN: [id number] Date of Birth: [birth year]

## 2020-04-23 NOTE — Telephone Encounter (Signed)
Pt daughter returned phone call, she has the following questions.   Neurologist changed her Lipitor to 80mg , she wanted to know if that prescription has been called in to the pharmacy. Right now pt is taking 2 of the 40mg .   Dr. advised pt to stop taking  Clopidogrel and she did but [facility] nurse told her she should still be talking it. Pt daughter would like clarification on that as well.

## 2020-04-23 NOTE — Telephone Encounter (Signed)
Attempted to call daughter but the mailbox is full.

## 2020-04-23 NOTE — Telephone Encounter (Signed)
She was supposed to be on plavix and ASA for 3 months and then ASA alone. She should stop plavix (clopidogrel) 3 months after her stroke. If neurologist changed medication dose, Rx should come from them. OK to give initial Rx for lipitor 80 mg.

## 2020-04-28 ENCOUNTER — Ambulatory Visit: Payer: PPO

## 2020-04-28 ENCOUNTER — Other Ambulatory Visit: Payer: Self-pay

## 2020-04-28 DIAGNOSIS — R278 Other lack of coordination: Secondary | ICD-10-CM | POA: Diagnosis not present

## 2020-04-28 DIAGNOSIS — R4701 Aphasia: Secondary | ICD-10-CM

## 2020-04-28 DIAGNOSIS — I639 Cerebral infarction, unspecified: Secondary | ICD-10-CM | POA: Diagnosis not present

## 2020-04-28 DIAGNOSIS — R41841 Cognitive communication deficit: Secondary | ICD-10-CM

## 2020-04-28 DIAGNOSIS — M6281 Muscle weakness (generalized): Secondary | ICD-10-CM | POA: Diagnosis not present

## 2020-04-28 DIAGNOSIS — W19XXXS Unspecified fall, sequela: Secondary | ICD-10-CM | POA: Diagnosis not present

## 2020-04-28 MED ORDER — ATORVASTATIN CALCIUM 80 MG PO TABS: 80.0000 mg | ORAL_TABLET | Freq: Every day | ORAL | 1 refills | Status: DC

## 2020-04-28 NOTE — Therapy (Signed)
[facility] [address] [place], [place], [address] Phone: [phone]   Fax:  [phone]  Speech Language Pathology Treatment  Patient Details  Name: Heather Nielsen MRN: [id number] Date of Birth: Mar 03, 1934 Referring Provider (SLP): Dr. Lelon Frohlich   Encounter Date: 04/28/2020  End of Session - 04/28/20 1308    Visit Number  4    Number of Visits  17    Date for SLP Re-Evaluation  06/11/20    Authorization Type  required    Authorization Time Period  15 visits approved 5/4-7/3    Authorization - Visit Number  3    Authorization - Number of Visits  15    SLP Start Time  0850    SLP Stop Time   0930    SLP Time Calculation (min)  40 min    Activity Tolerance  Patient tolerated treatment well       Past Medical History:  Diagnosis Date  . Arthritis   . Complication of anesthesia   . PONV (postoperative nausea and vomiting)   . Stroke ([place])   . Urinary incontinence     Past Surgical History:  Procedure Laterality Date  . KNEE SURGERY  2007   menicus   . LOOP RECORDER INSERTION N/A 03/05/2020   Procedure: LOOP RECORDER INSERTION;  Surgeon: Thompson Grayer, MD;  Location: [facility];  Service: Cardiovascular;  Laterality: N/A;  . ROTATOR CUFF REPAIR Right 2006  . TOTAL HIP ARTHROPLASTY Right 06/16/2015   Procedure: RIGHT TOTAL HIP ARTHROPLASTY ANTERIOR APPROACH;  Surgeon: Gaynelle Arabian, MD;  Location: [facility];  Service: Orthopedics;  Laterality: Right;  . TOTAL HIP ARTHROPLASTY Left 09/06/2016   Procedure: LEFT TOTAL HIP ARTHROPLASTY ANTERIOR APPROACH;  Surgeon: Gaynelle Arabian, MD;  Location: [facility];  Service: Orthopedics;  Laterality: Left;    There were no vitals filed for this visit.         ADULT SLP TREATMENT - 04/28/20 0911      General Information   Behavior/Cognition  Alert;Cooperative;Pleasant mood      Treatment Provided   Treatment provided  Cognitive-Linquistic       Cognitive-Linquistic Treatment   Treatment focused on  Aphasia;Cognition    Skilled Treatment  Pt largely unaware of verbal errors in conversation although pt is willing to verbalize more in session today spontaneously. Pt typed a simple messgae on iPad ("What a day!") to grandson about birth of his baby today, with rare min A for awareness of fingers double-tapping letters on the iPad. SLP guided pt through [place] with pt (1/3 correct). Pt more talkative with this SLP than previous appointment. SLP attempted to download Talk Path therapy on pt's current iPad but required iOS 13.0.       Assessment / Recommendations / Plan   Plan  Continue with current plan of care      Progression Toward Goals   Progression toward goals  Progressing toward goals   more willing to talk in session today      SLP Education - 04/28/20 1310    Education Details  how to assist pt wiht Talk [employer] News    Person(s) Educated  Spouse;Patient    Methods  Demonstration    Comprehension  Verbalized understanding       SLP Short Term Goals - 04/28/20 1311      SLP SHORT TERM GOAL #1   Title  Pt will name 10 items for moderately complex category with  occasional min A over 2 sessions    Time  3    Period  Weeks    Status  On-going      SLP SHORT TERM GOAL #2   Title  Pt will utilize compensations for aphasia in structured language tasks with occasional min A over 2 sessions    Time  3    Period  Weeks    Status  On-going      SLP SHORT TERM GOAL #3   Title  Pt will utilize compensations for aphasia during 10 minute simple conversation as needed with occasional min A over 2 sessions    Time  3    Period  Weeks    Status  On-going       SLP Long Term Goals - 04/28/20 1311      SLP LONG TERM GOAL #1   Title  Pt will utilize compensations for aphasia duirng 25 minute moderately complex conversation with rare min A over 2 sessions    Time  7    Period  Weeks    Status  On-going      SLP LONG TERM  GOAL #2   Title  Pt will call 3 family members using her smart phone with rare min A over 2 sessions    Time  7    Period  Weeks    Status  On-going      SLP LONG TERM GOAL #3   Title  Pt will ID and correct written errors at sentence level with occasional min A (texting, email or writing)    Time  7    Period  Weeks    Status  On-going       Plan - 04/28/20 1308    Clinical Impression Statement  Melondy Konecny cont to present today with moderate aphasia, most like transcortical motor. Receptive language is relatively intact, however compreehnsion of complex information such as medical or insurance information is impaired. Pt appeared more willing to attempt language tasks today, participating in [employer]. Her verbal error awareness is limited. See "skilled intervention" for details. Tammy, pt's attendant was instructed how to assist pt with Talk [employer] News via SLP modeling. I recommend cont'd skilled ST to maximize communication for safety and QOL and to reduce caregiver burdens. Cognitive goals may be added as indicated.    Speech Therapy Frequency  2x / week    Duration  --   8 weeks or 17 visits   Treatment/Interventions  Language facilitation;Environmental controls;SLP instruction and feedback;Compensatory strategies;Functional tasks;Cognitive reorganization;Compensatory techniques;Multimodal communcation approach;Internal/external aids;Patient/family education    Potential to Achieve Goals  Good       Patient will benefit from skilled therapeutic intervention in order to improve the following deficits and impairments:   Aphasia  Cognitive communication deficit    Problem List Patient Active Problem List   Diagnosis Date Noted  . Hyperlipidemia 03/11/2020  . Aphasia   . Acute ischemic stroke (HCC) 03/03/2020  . OA (osteoarthritis) of hip 06/16/2015  . Osteoarthritis of right hip 12/03/2014  . Routine general medical examination at a health care facility 12/03/2014     [facility] ,MS, CCC-SLP  04/28/2020, 1:12 PM  [facility] [address] [place], [place], [address] Phone: [phone]   Fax:  [phone]   Name: DERRICKA MERTZ MRN: [id number] Date of Birth: 07/26/1934

## 2020-04-28 NOTE — Patient Instructions (Signed)
You should practice something with your talking and understanding every day.

## 2020-04-28 NOTE — Telephone Encounter (Signed)
Daughter is aware Rx sent

## 2020-04-29 ENCOUNTER — Ambulatory Visit: Payer: PPO | Admitting: Speech Pathology

## 2020-04-29 DIAGNOSIS — R41841 Cognitive communication deficit: Secondary | ICD-10-CM

## 2020-04-29 DIAGNOSIS — R4701 Aphasia: Secondary | ICD-10-CM

## 2020-04-29 DIAGNOSIS — W19XXXS Unspecified fall, sequela: Secondary | ICD-10-CM | POA: Diagnosis not present

## 2020-04-29 DIAGNOSIS — M6281 Muscle weakness (generalized): Secondary | ICD-10-CM | POA: Diagnosis not present

## 2020-04-29 DIAGNOSIS — R278 Other lack of coordination: Secondary | ICD-10-CM | POA: Diagnosis not present

## 2020-04-29 DIAGNOSIS — I639 Cerebral infarction, unspecified: Secondary | ICD-10-CM | POA: Diagnosis not present

## 2020-04-29 NOTE — Therapy (Signed)
[facility] [address] [place], [place], [address] Phone: [phone]   Fax:  [phone]  Speech Language Pathology Treatment  Patient Details  Name: Heather Nielsen MRN: [id number] Date of Birth: Jun 24, 1934 Referring Provider (SLP): Dr. Chaya Jan   Encounter Date: 04/29/2020  End of Session - 04/29/20 0944    Visit Number  5    Number of Visits  17    Date for SLP Re-Evaluation  06/11/20    Authorization Time Period  15 visits approved 5/4-7/3    Authorization - Visit Number  4    Authorization - Number of Visits  15    SLP Start Time  0847    SLP Stop Time   0930    SLP Time Calculation (min)  43 min    Activity Tolerance  Patient tolerated treatment well       Past Medical History:  Diagnosis Date  . Arthritis   . Complication of anesthesia   . PONV (postoperative nausea and vomiting)   . Stroke ([facility])   . Urinary incontinence     Past Surgical History:  Procedure Laterality Date  . KNEE SURGERY  2007   menicus   . LOOP RECORDER INSERTION N/A 03/05/2020   Procedure: LOOP RECORDER INSERTION;  Surgeon: Hillis Range, MD;  Location: [facility];  Service: Cardiovascular;  Laterality: N/A;  . ROTATOR CUFF REPAIR Right 2006  . TOTAL HIP ARTHROPLASTY Right 06/16/2015   Procedure: RIGHT TOTAL HIP ARTHROPLASTY ANTERIOR APPROACH;  Surgeon: Ollen Gross, MD;  Location: [facility];  Service: Orthopedics;  Laterality: Right;  . TOTAL HIP ARTHROPLASTY Left 09/06/2016   Procedure: LEFT TOTAL HIP ARTHROPLASTY ANTERIOR APPROACH;  Surgeon: Ollen Gross, MD;  Location: [facility];  Service: Orthopedics;  Laterality: Left;    There were no vitals filed for this visit.  Subjective Assessment - 04/29/20 0851    Subjective  "What was that?"    Currently in Pain?  No/denies            ADULT SLP TREATMENT - 04/29/20 0932      General Information   Behavior/Cognition  Alert;Cooperative;Pleasant mood       Treatment Provided   Treatment provided  Cognitive-Linquistic      Pain Assessment   Pain Assessment  No/denies pain      Cognitive-Linquistic Treatment   Treatment focused on  Aphasia;Cognition    Skilled Treatment  Patient continues to verbalize more spontaneously and initiated questions/comments about crowded waiting room; remains largely unaware of verbal errors. Brought daughter's iPad with TalkPath therapy app downloaded. SLP initiated request through app for access to pt's account to upload personalized tasks. Demonstrated how to access activities for language, cognition to pt and aide that will likely be most appropriate for pt. Pt requested more difficult confrontation naming task; SLP increased complexity to level 3. Pt used appropriate description "bedding" for "quilt." With image of "rhinoceros" patient required usual mod-max cues for description via semantic feature analysis. Generated 3 physical descriptions, 2 location descriptions, and 3 associations. Pt spontaneously spelled 2 words "elephant" and "monkey" when she could not say them correctly, and SLP praised her for this. Ultimately named with first letter cue. For stimulus "parachute," pt generated action, location, association, and description with usual mod cues. SLP explained to pt that even when she was not able to come up with the word, her descriptions allow her listener to understand her message. Patient again expressed reluctance to have conversations  with others, "I can talk with myself." SLP acknowledged that patient is hesitant to talk with others unless she can talk "normally" but again reinforced that in order to improve she must engage in conversations. Simple spelling 100% accuracy (made errors when selecting due to motor control vs language). Simple sentence unscrambling 100% accuracy rare min A.      Assessment / Recommendations / Plan   Plan  Continue with current plan of care      Progression Toward Goals    Progression toward goals  Progressing toward goals   slow progress; participating in conversation in session      SLP Education - 04/29/20 0944    Education Details  appropriate activities on TalkPath Therapy    Person(s) Educated  Patient   caregiver   Methods  Explanation    Comprehension  Verbalized understanding;Need further instruction       SLP Short Term Goals - 04/29/20 0944      SLP SHORT TERM GOAL #1   Title  Pt will name 10 items for moderately complex category with occasional min A over 2 sessions    Time  3    Period  Weeks    Status  On-going      SLP SHORT TERM GOAL #2   Title  Pt will utilize compensations for aphasia in structured language tasks with occasional min A over 2 sessions    Time  3    Period  Weeks    Status  On-going      SLP SHORT TERM GOAL #3   Title  Pt will utilize compensations for aphasia during 10 minute simple conversation as needed with occasional min A over 2 sessions    Time  3    Period  Weeks    Status  On-going       SLP Long Term Goals - 04/29/20 0945      SLP LONG TERM GOAL #1   Title  Pt will utilize compensations for aphasia duirng 25 minute moderately complex conversation with rare min A over 2 sessions    Time  7    Period  Weeks    Status  On-going      SLP LONG TERM GOAL #2   Title  Pt will call 3 family members using her smart phone with rare min A over 2 sessions    Time  7    Period  Weeks    Status  On-going      SLP LONG TERM GOAL #3   Title  Pt will ID and correct written errors at sentence level with occasional min A (texting, email or writing)    Time  7    Period  Weeks    Status  On-going       Plan - 04/29/20 0944    Clinical Impression Statement  Cambry Stopher cont to present today with moderate aphasia, most like transcortical motor. Receptive language is relatively intact, however compreehnsion of complex information such as medical or insurance information is impaired. Pt appeared more willing  to attempt language tasks today, participating in [employer]. Her verbal error awareness is limited. See "skilled intervention" for details. Tammy, pt's attendant was instructed how to assist pt with Talk [employer] via SLP modeling. I recommend cont'd skilled ST to maximize communication for safety and QOL and to reduce caregiver burdens. Cognitive goals may be added as indicated.    Speech Therapy Frequency  2x / week  Duration  --   8 weeks or 17 visits   Treatment/Interventions  Language facilitation;Environmental controls;SLP instruction and feedback;Compensatory strategies;Functional tasks;Cognitive reorganization;Compensatory techniques;Multimodal communcation approach;Internal/external aids;Patient/family education    Potential to Achieve Goals  Good       Patient will benefit from skilled therapeutic intervention in order to improve the following deficits and impairments:   Aphasia  Cognitive communication deficit    Problem List Patient Active Problem List   Diagnosis Date Noted  . Hyperlipidemia 03/11/2020  . Aphasia   . Acute ischemic stroke ([place]) 03/03/2020  . OA (osteoarthritis) of hip 06/16/2015  . Osteoarthritis of right hip 12/03/2014  . Routine general medical examination at a health care facility 12/03/2014   Deneise Lever, [place], [place] 04/29/2020, 9:45 AM  [facility] [address] [place], [place], [address] Phone: [phone]   Fax:  [phone]   Name: LAMIRACLE CHAIDEZ MRN: [id number] Date of Birth: 01-21-1934

## 2020-05-03 ENCOUNTER — Ambulatory Visit: Payer: PPO | Admitting: Speech Pathology

## 2020-05-03 ENCOUNTER — Encounter: Payer: Self-pay | Admitting: Speech Pathology

## 2020-05-03 ENCOUNTER — Other Ambulatory Visit: Payer: Self-pay

## 2020-05-03 DIAGNOSIS — R4701 Aphasia: Secondary | ICD-10-CM | POA: Diagnosis not present

## 2020-05-03 DIAGNOSIS — R41841 Cognitive communication deficit: Secondary | ICD-10-CM

## 2020-05-03 DIAGNOSIS — W19XXXS Unspecified fall, sequela: Secondary | ICD-10-CM | POA: Diagnosis not present

## 2020-05-03 DIAGNOSIS — I639 Cerebral infarction, unspecified: Secondary | ICD-10-CM | POA: Diagnosis not present

## 2020-05-03 DIAGNOSIS — R278 Other lack of coordination: Secondary | ICD-10-CM | POA: Diagnosis not present

## 2020-05-03 DIAGNOSIS — M6281 Muscle weakness (generalized): Secondary | ICD-10-CM | POA: Diagnosis not present

## 2020-05-03 NOTE — Therapy (Signed)
[facility] [address] [place], [place], [address] Phone: [phone]   Fax:  [phone]  Speech Language Pathology Treatment  Patient Details  Name: Heather Nielsen MRN: [id number] Date of Birth: Jul 26, 1934 Referring Provider (SLP): Dr. Lelon Frohlich   Encounter Date: 05/03/2020  End of Session - 05/03/20 1228    Visit Number  6    Number of Visits  17    Date for SLP Re-Evaluation  06/11/20    Authorization Type  required    Authorization Time Period  15 visits approved 5/4-7/3    Authorization - Visit Number  5    Authorization - Number of Visits  15    SLP Start Time  2878    SLP Stop Time   1146    SLP Time Calculation (min)  43 min       Past Medical History:  Diagnosis Date  . Arthritis   . Complication of anesthesia   . PONV (postoperative nausea and vomiting)   . Stroke ([place])   . Urinary incontinence     Past Surgical History:  Procedure Laterality Date  . KNEE SURGERY  2007   menicus   . LOOP RECORDER INSERTION N/A 03/05/2020   Procedure: LOOP RECORDER INSERTION;  Surgeon: Thompson Grayer, MD;  Location: [facility];  Service: Cardiovascular;  Laterality: N/A;  . ROTATOR CUFF REPAIR Right 2006  . TOTAL HIP ARTHROPLASTY Right 06/16/2015   Procedure: RIGHT TOTAL HIP ARTHROPLASTY ANTERIOR APPROACH;  Surgeon: Gaynelle Arabian, MD;  Location: [facility];  Service: Orthopedics;  Laterality: Right;  . TOTAL HIP ARTHROPLASTY Left 09/06/2016   Procedure: LEFT TOTAL HIP ARTHROPLASTY ANTERIOR APPROACH;  Surgeon: Gaynelle Arabian, MD;  Location: [facility];  Service: Orthopedics;  Laterality: Left;    There were no vitals filed for this visit.  Subjective Assessment - 05/03/20 1222    Subjective  "I got my new thing on Saturday" Shows new i pad with Talk Path    Patient is accompained by:  --   Tammy, personal attendant   Currently in Pain?  No/denies            ADULT SLP TREATMENT - 05/03/20  1222      General Information   Behavior/Cognition  Alert;Cooperative;Pleasant mood      Treatment Provided   Treatment provided  Cognitive-Linquistic      Cognitive-Linquistic Treatment   Treatment focused on  Aphasia;Cognition;Patient/family/caregiver education    Skilled Treatment  Pt demonstrates use of Talk Path app. She prefers to work on this alone, rather than with Tammy at home. She is requesting harder tasks. I demostrated how to change levels and use the cues on the "speaking" activities, however instructed her that she needs support and cues to successfully complete higher level tasks. Pt named 10 items in relevant category (a cruise) with frerquent mod written, sentamtic and 1st letter cues) Heather Nielsen required usual min questioning cues and cues to tell me about that as compensation for aphasia. She was successful spontaneously 5x during naming task.       Assessment / Recommendations / Plan   Plan  Continue with current plan of care      Progression Toward Goals   Progression toward goals  Progressing toward goals       SLP Education - 05/03/20 1225    Education Details  appropriate activitries on Talk Path    Person(s) Educated  Patient   Tammy, personal attendant   Comprehension  Verbal cues required;Need further instruction       SLP Short Term Goals - 05/03/20 1227      SLP SHORT TERM GOAL #1   Title  Pt will name 10 items for moderately complex category with occasional min A over 2 sessions    Time  2    Period  Weeks    Status  On-going      SLP SHORT TERM GOAL #2   Title  Pt will utilize compensations for aphasia in structured language tasks with occasional min A over 2 sessions    Time  2    Period  Weeks    Status  On-going      SLP SHORT TERM GOAL #3   Title  Pt will utilize compensations for aphasia during 10 minute simple conversation as needed with occasional min A over 2 sessions    Time  2    Period  Weeks    Status  On-going       SLP Long  Term Goals - 05/03/20 1227      SLP LONG TERM GOAL #1   Title  Pt will utilize compensations for aphasia duirng 25 minute moderately complex conversation with rare min A over 2 sessions    Time  6    Period  Weeks    Status  On-going      SLP LONG TERM GOAL #2   Title  Pt will call 3 family members using her smart phone with rare min A over 2 sessions    Time  6    Period  Weeks    Status  On-going      SLP LONG TERM GOAL #3   Title  Pt will ID and correct written errors at sentence level with occasional min A (texting, email or writing)    Time  6    Period  Weeks    Status  On-going       Plan - 05/03/20 1226    Clinical Impression Statement  Heather Nielsen cont to present today with moderate aphasia, most like transcortical motor. Receptive language is relatively intact, however compreehnsion of complex information such as medical or insurance information is impaired. Pt appeared more willing to attempt language tasks today, participating in [employer]. Her verbal error awareness is limited. See "skilled intervention" for details. Tammy, pt's attendant was instructed how to assist pt with Talk [employer] News via SLP modeling. I recommend cont'd skilled ST to maximize communication for safety and QOL and to reduce caregiver burdens. Cognitive goals may be added as indicated.    Speech Therapy Frequency  2x / week    Duration  --   8 weeks or 17 visits   Treatment/Interventions  Language facilitation;Environmental controls;SLP instruction and feedback;Compensatory strategies;Functional tasks;Cognitive reorganization;Compensatory techniques;Multimodal communcation approach;Internal/external aids;Patient/family education    Potential to Achieve Goals  Good       Patient will benefit from skilled therapeutic intervention in order to improve the following deficits and impairments:   Aphasia  Cognitive communication deficit    Problem List Patient Active Problem List   Diagnosis Date  Noted  . Hyperlipidemia 03/11/2020  . Aphasia   . Acute ischemic stroke (HCC) 03/03/2020  . OA (osteoarthritis) of hip 06/16/2015  . Osteoarthritis of right hip 12/03/2014  . Routine general medical examination at a health care facility 12/03/2014    Kamoria Lucien, Radene Journey MS, CCC-SLP 05/03/2020, 12:28 PM  [facility] [address] [place], [place], [address] Phone: [phone]   Fax:  [phone]   Name: ASTOU LADA MRN: [id number] Date of Birth: 01/19/1934

## 2020-05-05 ENCOUNTER — Ambulatory Visit: Payer: PPO | Admitting: Speech Pathology

## 2020-05-05 DIAGNOSIS — I639 Cerebral infarction, unspecified: Secondary | ICD-10-CM | POA: Diagnosis not present

## 2020-05-05 DIAGNOSIS — M6281 Muscle weakness (generalized): Secondary | ICD-10-CM | POA: Diagnosis not present

## 2020-05-05 DIAGNOSIS — W19XXXS Unspecified fall, sequela: Secondary | ICD-10-CM | POA: Diagnosis not present

## 2020-05-05 DIAGNOSIS — R278 Other lack of coordination: Secondary | ICD-10-CM | POA: Diagnosis not present

## 2020-05-07 DIAGNOSIS — H401132 Primary open-angle glaucoma, bilateral, moderate stage: Secondary | ICD-10-CM | POA: Diagnosis not present

## 2020-05-10 ENCOUNTER — Ambulatory Visit: Payer: PPO

## 2020-05-10 LAB — CUP PACEART REMOTE DEVICE CHECK
Date Time Interrogation Session: 20210523154358
Implantable Pulse Generator Implant Date: 20210319

## 2020-05-11 ENCOUNTER — Ambulatory Visit (INDEPENDENT_AMBULATORY_CARE_PROVIDER_SITE_OTHER): Payer: PPO | Admitting: *Deleted

## 2020-05-11 ENCOUNTER — Telehealth: Payer: Self-pay | Admitting: Internal Medicine

## 2020-05-11 DIAGNOSIS — I639 Cerebral infarction, unspecified: Secondary | ICD-10-CM | POA: Diagnosis not present

## 2020-05-11 NOTE — Telephone Encounter (Signed)
Form needs to be completed (Form was sealed)- placed in dr's folder.  Form needs to be completed and picked up by June 15th.  Call for pickup.

## 2020-05-11 NOTE — Progress Notes (Signed)
[facility] Summary Report / Loop Recorder 

## 2020-05-12 NOTE — Telephone Encounter (Signed)
Placed in Dr Hernandez's folder 

## 2020-05-19 ENCOUNTER — Encounter: Payer: PPO | Admitting: Speech Pathology

## 2020-05-20 ENCOUNTER — Encounter: Payer: PPO | Admitting: Speech Pathology

## 2020-05-20 NOTE — Telephone Encounter (Signed)
Left detailed message on machine for daughter that the form is ready for pick up

## 2020-05-24 ENCOUNTER — Encounter: Payer: PPO | Admitting: Speech Pathology

## 2020-05-26 ENCOUNTER — Encounter: Payer: PPO | Admitting: Speech Pathology

## 2020-05-27 DIAGNOSIS — M25562 Pain in left knee: Secondary | ICD-10-CM | POA: Diagnosis not present

## 2020-05-27 DIAGNOSIS — M13861 Other specified arthritis, right knee: Secondary | ICD-10-CM | POA: Diagnosis not present

## 2020-06-02 ENCOUNTER — Encounter: Payer: PPO | Admitting: Speech Pathology

## 2020-06-07 ENCOUNTER — Encounter: Payer: PPO | Admitting: Speech Pathology

## 2020-06-09 ENCOUNTER — Encounter: Payer: PPO | Admitting: Speech Pathology

## 2020-06-11 ENCOUNTER — Ambulatory Visit: Payer: PPO | Admitting: Internal Medicine

## 2020-06-14 ENCOUNTER — Ambulatory Visit (INDEPENDENT_AMBULATORY_CARE_PROVIDER_SITE_OTHER): Payer: PPO | Admitting: *Deleted

## 2020-06-14 DIAGNOSIS — I639 Cerebral infarction, unspecified: Secondary | ICD-10-CM | POA: Diagnosis not present

## 2020-06-14 LAB — CUP PACEART REMOTE DEVICE CHECK
Date Time Interrogation Session: 20210627230523
Implantable Pulse Generator Implant Date: 20210319

## 2020-06-14 NOTE — Progress Notes (Signed)
[facility] Summary Report / Loop Recorder 

## 2020-06-23 ENCOUNTER — Other Ambulatory Visit: Payer: Self-pay

## 2020-06-23 ENCOUNTER — Ambulatory Visit (INDEPENDENT_AMBULATORY_CARE_PROVIDER_SITE_OTHER): Payer: PPO | Admitting: Internal Medicine

## 2020-06-23 ENCOUNTER — Encounter: Payer: Self-pay | Admitting: Internal Medicine

## 2020-06-23 VITALS — BP 118/70 | HR 70 | Temp 98.0°F | Wt 94.9 lb

## 2020-06-23 DIAGNOSIS — I10 Essential (primary) hypertension: Secondary | ICD-10-CM | POA: Diagnosis not present

## 2020-06-23 DIAGNOSIS — I639 Cerebral infarction, unspecified: Secondary | ICD-10-CM | POA: Diagnosis not present

## 2020-06-23 DIAGNOSIS — I63412 Cerebral infarction due to embolism of left middle cerebral artery: Secondary | ICD-10-CM | POA: Diagnosis not present

## 2020-06-23 DIAGNOSIS — E785 Hyperlipidemia, unspecified: Secondary | ICD-10-CM | POA: Diagnosis not present

## 2020-06-23 DIAGNOSIS — Z8673 Personal history of transient ischemic attack (TIA), and cerebral infarction without residual deficits: Secondary | ICD-10-CM | POA: Insufficient documentation

## 2020-06-23 DIAGNOSIS — R4701 Aphasia: Secondary | ICD-10-CM

## 2020-06-23 NOTE — Patient Instructions (Signed)
-  Nice seeing you today!!  -Schedule a 6 month follow up. Please come in fasting to check your cholesterol.

## 2020-06-23 NOTE — Progress Notes (Signed)
Established Patient Office Visit     This visit occurred during the SARS-CoV-2 public health emergency.  Safety protocols were in place, including screening questions prior to the visit, additional usage of staff PPE, and extensive cleaning of exam room while observing appropriate contact time as indicated for disinfecting solutions.    CC/Reason for Visit: 70-month follow-up  HPI: Heather Nielsen is a 86 y.o. female who is coming in today for the above mentioned reasons. Past Medical History is significant for: Significant hyperlipidemia with an LDL cholesterol of 206 in March.  She is status post a left MCA CVA also in March that left her with significant aphasia.  She has a loop recorder in place.  She has now moved into an assisted living facility.  She has no acute complaints today.   Past Medical/Surgical History: Past Medical History:  Diagnosis Date  . Arthritis   . Complication of anesthesia   . PONV (postoperative nausea and vomiting)   . Stroke (HCC)   . Urinary incontinence     Past Surgical History:  Procedure Laterality Date  . KNEE SURGERY  2007   [facility]   . LOOP RECORDER INSERTION N/A 03/05/2020   Procedure: LOOP RECORDER INSERTION;  Surgeon: Hillis Range, MD;  Location: [facility];  Service: Cardiovascular;  Laterality: N/A;  . ROTATOR CUFF REPAIR Right 2006  . TOTAL HIP ARTHROPLASTY Right 06/16/2015   Procedure: RIGHT TOTAL HIP ARTHROPLASTY ANTERIOR APPROACH;  Surgeon: Ollen Gross, MD;  Location: [facility];  Service: Orthopedics;  Laterality: Right;  . TOTAL HIP ARTHROPLASTY Left 09/06/2016   Procedure: LEFT TOTAL HIP ARTHROPLASTY ANTERIOR APPROACH;  Surgeon: Ollen Gross, MD;  Location: [facility];  Service: Orthopedics;  Laterality: Left;    Social History:  reports that she quit smoking about 35 years ago. Her smoking use included cigarettes. She has a 6.25 pack-year smoking history. She has never used smokeless tobacco. She reports current alcohol  use. She reports that she does not use drugs.  Allergies: Allergies  Allergen Reactions  . Penicillins Other (See Comments)    Has patient had a PCN reaction causing immediate rash, facial/tongue/throat swelling, SOB or lightheadedness with hypotension:No Has patient had a PCN reaction causing severe rash involving mucus membranes or skin necrosis:No Has patient had a PCN reaction that required hospitalization:No Has patient had a PCN reaction occurring within the last 10 years:No If all of the above answers are "NO", then may proceed with Cephalosporin use. Urinary issues--pt.says "felt like razor blades in her bladder"  . Pneumococcal Vaccines Hives, Itching and Rash    Family History:  Family History  Problem Relation Age of Onset  . Heart disease Mother        died age 63  . CAD Brother 15     Current Outpatient Medications:  .  aspirin 325 MG tablet, Take 1 tablet (325 mg total) by mouth daily., Disp: 30 tablet, Rfl: 0 .  atorvastatin (LIPITOR) 80 MG tablet, Take 1 tablet (80 mg total) by mouth daily., Disp: 90 tablet, Rfl: 1 .  latanoprost (XALATAN) 0.005 % ophthalmic solution, Place 1 drop into the right eye daily., Disp: , Rfl:  .  lisinopril (ZESTRIL) 10 MG tablet, Take 1 tablet (10 mg total) by mouth daily., Disp: 90 tablet, Rfl: 1 .  clopidogrel (PLAVIX) 75 MG tablet, Take 1 tablet (75 mg total) by mouth daily. (Patient not taking: Reported on 06/23/2020), Disp: 90 tablet, Rfl: 1  Review of Systems:  Constitutional: Denies fever, chills, diaphoresis, appetite change and fatigue.  HEENT: Denies photophobia, eye pain, redness, hearing loss, ear pain, congestion, sore throat, rhinorrhea, sneezing, mouth sores, trouble swallowing, neck pain, neck stiffness and tinnitus.   Respiratory: Denies SOB, DOE, cough, chest tightness,  and wheezing.   Cardiovascular: Denies chest pain, palpitations and leg swelling.  Gastrointestinal: Denies nausea, vomiting, abdominal pain, diarrhea,  constipation, blood in stool and abdominal distention.  Genitourinary: Denies dysuria, urgency, frequency, hematuria, flank pain and difficulty urinating.  Endocrine: Denies: hot or cold intolerance, sweats, changes in hair or nails, polyuria, polydipsia. Musculoskeletal: Denies myalgias, back pain, joint swelling, arthralgias and gait problem.  Skin: Denies pallor, rash and wound.  Neurological: Denies dizziness, seizures, syncope, weakness, light-headedness, numbness and headaches.  Hematological: Denies adenopathy. Easy bruising, personal or family bleeding history  Psychiatric/Behavioral: Denies suicidal ideation, mood changes, confusion, nervousness, sleep disturbance and agitation    Physical Exam: Vitals:   06/23/20 1432  BP: 118/70  Pulse: 70  Temp: 98 F (36.7 C)  TempSrc: Temporal  SpO2: 97%  Weight: 94 lb 14.4 oz (43 kg)    Body mass index is 17.36 kg/m.   Constitutional: NAD, calm, comfortable Eyes: PERRL, lids and conjunctivae normal ENMT: Mucous membranes are moist.  Respiratory: clear to auscultation bilaterally, no wheezing, no crackles. Normal respiratory effort. No accessory muscle use.  Cardiovascular: Regular rate and rhythm, no murmurs / rubs / gallops. No extremity edema.  Psychiatric: Normal judgment and insight. Alert and oriented x 3. Normal mood.    Impression and Plan:  Hyperlipidemia, unspecified hyperlipidemia type -LDL was 206 in March. -Started on high intensity statin, Lipitor 80 mg. -Follow-up in 6 months.  At that time I will repeat her lipids.  Goal LDL is less than 70.  H/o CVA Aphasia -She still has significant aphasia and some mild confusion, she continue speech therapy. -She takes aspirin. -Has a loop recorder.  Hypertension -Well-controlled on lisinopril.   Patient Instructions  -Nice seeing you today!!  -Schedule a 6 month follow up. Please come in fasting to check your cholesterol.     Chaya Jan,  MD [facility]

## 2020-07-12 ENCOUNTER — Ambulatory Visit: Payer: PPO | Admitting: Neurology

## 2020-07-19 ENCOUNTER — Ambulatory Visit (INDEPENDENT_AMBULATORY_CARE_PROVIDER_SITE_OTHER): Payer: PPO | Admitting: *Deleted

## 2020-07-19 DIAGNOSIS — I63412 Cerebral infarction due to embolism of left middle cerebral artery: Secondary | ICD-10-CM | POA: Diagnosis not present

## 2020-07-20 LAB — CUP PACEART REMOTE DEVICE CHECK
Date Time Interrogation Session: 20210730230402
Implantable Pulse Generator Implant Date: 20210319

## 2020-07-20 NOTE — Progress Notes (Signed)
[facility] Summary Report / Loop Recorder 

## 2020-07-27 ENCOUNTER — Telehealth: Payer: Self-pay | Admitting: Internal Medicine

## 2020-07-27 NOTE — Telephone Encounter (Signed)
Pt daughter Lynnell Dike call and stated that they need the order sent on and you can call her at [phone].

## 2020-07-27 NOTE — Telephone Encounter (Signed)
Not sure what this is. Unless there was a new form placed in my folder today.

## 2020-07-27 NOTE — Telephone Encounter (Signed)
Daughter is looking for a form to be signed for Speech?

## 2020-07-29 NOTE — Telephone Encounter (Signed)
Form completed, faxed, and confirmed 

## 2020-08-05 DIAGNOSIS — R41841 Cognitive communication deficit: Secondary | ICD-10-CM | POA: Diagnosis not present

## 2020-08-05 DIAGNOSIS — R4701 Aphasia: Secondary | ICD-10-CM | POA: Diagnosis not present

## 2020-08-11 DIAGNOSIS — R4701 Aphasia: Secondary | ICD-10-CM | POA: Diagnosis not present

## 2020-08-11 DIAGNOSIS — R41841 Cognitive communication deficit: Secondary | ICD-10-CM | POA: Diagnosis not present

## 2020-08-18 DIAGNOSIS — R41841 Cognitive communication deficit: Secondary | ICD-10-CM | POA: Diagnosis not present

## 2020-08-18 DIAGNOSIS — R4701 Aphasia: Secondary | ICD-10-CM | POA: Diagnosis not present

## 2020-08-19 ENCOUNTER — Ambulatory Visit (INDEPENDENT_AMBULATORY_CARE_PROVIDER_SITE_OTHER): Payer: PPO | Admitting: *Deleted

## 2020-08-19 DIAGNOSIS — I63412 Cerebral infarction due to embolism of left middle cerebral artery: Secondary | ICD-10-CM

## 2020-08-19 LAB — CUP PACEART REMOTE DEVICE CHECK
Date Time Interrogation Session: 20210901230617
Implantable Pulse Generator Implant Date: 20210319

## 2020-08-20 NOTE — Progress Notes (Signed)
[facility] Summary Report / Loop Recorder 

## 2020-08-25 DIAGNOSIS — M25562 Pain in left knee: Secondary | ICD-10-CM | POA: Diagnosis not present

## 2020-08-25 DIAGNOSIS — R41841 Cognitive communication deficit: Secondary | ICD-10-CM | POA: Diagnosis not present

## 2020-08-25 DIAGNOSIS — R4701 Aphasia: Secondary | ICD-10-CM | POA: Diagnosis not present

## 2020-09-03 DIAGNOSIS — R4701 Aphasia: Secondary | ICD-10-CM | POA: Diagnosis not present

## 2020-09-03 DIAGNOSIS — R41841 Cognitive communication deficit: Secondary | ICD-10-CM | POA: Diagnosis not present

## 2020-09-21 ENCOUNTER — Ambulatory Visit (INDEPENDENT_AMBULATORY_CARE_PROVIDER_SITE_OTHER): Payer: PPO

## 2020-09-21 DIAGNOSIS — I63412 Cerebral infarction due to embolism of left middle cerebral artery: Secondary | ICD-10-CM | POA: Diagnosis not present

## 2020-09-21 LAB — CUP PACEART REMOTE DEVICE CHECK
Date Time Interrogation Session: 20211004230143
Implantable Pulse Generator Implant Date: 20210319

## 2020-09-23 ENCOUNTER — Other Ambulatory Visit: Payer: Self-pay | Admitting: Internal Medicine

## 2020-09-23 NOTE — Progress Notes (Signed)
[facility] Summary Report / Loop Recorder 

## 2020-10-24 LAB — CUP PACEART REMOTE DEVICE CHECK
Date Time Interrogation Session: 20211106230149
Implantable Pulse Generator Implant Date: 20210319

## 2020-10-25 ENCOUNTER — Ambulatory Visit (INDEPENDENT_AMBULATORY_CARE_PROVIDER_SITE_OTHER): Payer: PPO

## 2020-10-25 DIAGNOSIS — I63412 Cerebral infarction due to embolism of left middle cerebral artery: Secondary | ICD-10-CM

## 2020-10-25 NOTE — Progress Notes (Signed)
[facility] Summary Report / Loop Recorder 

## 2020-11-01 DIAGNOSIS — H401132 Primary open-angle glaucoma, bilateral, moderate stage: Secondary | ICD-10-CM | POA: Diagnosis not present

## 2020-11-21 ENCOUNTER — Emergency Department (HOSPITAL_COMMUNITY): Payer: PPO

## 2020-11-21 ENCOUNTER — Encounter (HOSPITAL_COMMUNITY): Payer: Self-pay | Admitting: *Deleted

## 2020-11-21 ENCOUNTER — Observation Stay (HOSPITAL_COMMUNITY)
Admission: EM | Admit: 2020-11-21 | Discharge: 2020-11-22 | Disposition: A | Discharge status: Home / Self Care | Payer: PPO | Attending: Internal Medicine | Admitting: Internal Medicine

## 2020-11-21 ENCOUNTER — Observation Stay (HOSPITAL_COMMUNITY): Payer: PPO

## 2020-11-21 ENCOUNTER — Other Ambulatory Visit: Payer: Self-pay

## 2020-11-21 DIAGNOSIS — R29818 Other symptoms and signs involving the nervous system: Secondary | ICD-10-CM | POA: Diagnosis present

## 2020-11-21 DIAGNOSIS — R197 Diarrhea, unspecified: Secondary | ICD-10-CM | POA: Diagnosis not present

## 2020-11-21 DIAGNOSIS — I672 Cerebral atherosclerosis: Secondary | ICD-10-CM | POA: Diagnosis not present

## 2020-11-21 DIAGNOSIS — I10 Essential (primary) hypertension: Secondary | ICD-10-CM | POA: Diagnosis present

## 2020-11-21 DIAGNOSIS — R4701 Aphasia: Secondary | ICD-10-CM | POA: Diagnosis present

## 2020-11-21 DIAGNOSIS — R4182 Altered mental status, unspecified: Secondary | ICD-10-CM

## 2020-11-21 DIAGNOSIS — Z7982 Long term (current) use of aspirin: Secondary | ICD-10-CM | POA: Insufficient documentation

## 2020-11-21 DIAGNOSIS — E785 Hyperlipidemia, unspecified: Secondary | ICD-10-CM | POA: Diagnosis not present

## 2020-11-21 DIAGNOSIS — R569 Unspecified convulsions: Secondary | ICD-10-CM | POA: Diagnosis not present

## 2020-11-21 DIAGNOSIS — Z79899 Other long term (current) drug therapy: Secondary | ICD-10-CM | POA: Diagnosis not present

## 2020-11-21 DIAGNOSIS — Z20822 Contact with and (suspected) exposure to covid-19: Secondary | ICD-10-CM | POA: Diagnosis not present

## 2020-11-21 DIAGNOSIS — J90 Pleural effusion, not elsewhere classified: Secondary | ICD-10-CM | POA: Diagnosis not present

## 2020-11-21 DIAGNOSIS — Z96643 Presence of artificial hip joint, bilateral: Secondary | ICD-10-CM | POA: Insufficient documentation

## 2020-11-21 DIAGNOSIS — I639 Cerebral infarction, unspecified: Principal | ICD-10-CM | POA: Insufficient documentation

## 2020-11-21 DIAGNOSIS — Z8673 Personal history of transient ischemic attack (TIA), and cerebral infarction without residual deficits: Secondary | ICD-10-CM

## 2020-11-21 DIAGNOSIS — Z87891 Personal history of nicotine dependence: Secondary | ICD-10-CM | POA: Insufficient documentation

## 2020-11-21 DIAGNOSIS — R4781 Slurred speech: Secondary | ICD-10-CM | POA: Diagnosis not present

## 2020-11-21 DIAGNOSIS — G319 Degenerative disease of nervous system, unspecified: Secondary | ICD-10-CM | POA: Diagnosis not present

## 2020-11-21 DIAGNOSIS — R29810 Facial weakness: Secondary | ICD-10-CM | POA: Diagnosis not present

## 2020-11-21 DIAGNOSIS — R531 Weakness: Secondary | ICD-10-CM | POA: Diagnosis not present

## 2020-11-21 LAB — APTT: aPTT: 24 seconds (ref 24–36)

## 2020-11-21 LAB — PROTIME-INR
INR: 1 (ref 0.8–1.2)
Prothrombin Time: 12.8 seconds (ref 11.4–15.2)

## 2020-11-21 LAB — I-STAT CHEM 8, ED
BUN: 16 mg/dL (ref 8–23)
Calcium, Ion: 1.17 mmol/L (ref 1.15–1.40)
Chloride: 99 mmol/L (ref 98–111)
Creatinine, Ser: 0.6 mg/dL (ref 0.44–1.00)
Glucose, Bld: 135 mg/dL — ABNORMAL HIGH (ref 70–99)
HCT: 38 % (ref 36.0–46.0)
Hemoglobin: 12.9 g/dL (ref 12.0–15.0)
Potassium: 3.9 mmol/L (ref 3.5–5.1)
Sodium: 135 mmol/L (ref 135–145)
TCO2: 24 mmol/L (ref 22–32)

## 2020-11-21 LAB — COMPREHENSIVE METABOLIC PANEL
ALT: 30 U/L (ref 0–44)
AST: 34 U/L (ref 15–41)
Albumin: 3.9 g/dL (ref 3.5–5.0)
Alkaline Phosphatase: 81 U/L (ref 38–126)
Anion gap: 11 (ref 5–15)
BUN: 14 mg/dL (ref 8–23)
CO2: 24 mmol/L (ref 22–32)
Calcium: 9 mg/dL (ref 8.9–10.3)
Chloride: 99 mmol/L (ref 98–111)
Creatinine, Ser: 0.67 mg/dL (ref 0.44–1.00)
GFR, Estimated: >60 mL/min (ref >60)
Glucose, Bld: 141 mg/dL — ABNORMAL HIGH (ref 70–99)
Potassium: 3.9 mmol/L (ref 3.5–5.1)
Sodium: 134 mmol/L — ABNORMAL LOW (ref 135–145)
Total Bilirubin: 1.1 mg/dL (ref 0.3–1.2)
Total Protein: 6.4 g/dL — ABNORMAL LOW (ref 6.5–8.1)

## 2020-11-21 LAB — CBC
HCT: 36.4 % (ref 36.0–46.0)
Hemoglobin: 12 g/dL (ref 12.0–15.0)
MCH: 30.7 pg (ref 26.0–34.0)
MCHC: 33 g/dL (ref 30.0–36.0)
MCV: 93.1 fL (ref 80.0–100.0)
Platelets: 226 10*3/uL (ref 150–400)
RBC: 3.91 MIL/uL (ref 3.87–5.11)
RDW: 12.9 % (ref 11.5–15.5)
WBC: 12.3 10*3/uL — ABNORMAL HIGH (ref 4.0–10.5)
nRBC: 0 % (ref 0.0–0.2)

## 2020-11-21 LAB — DIFFERENTIAL
Abs Immature Granulocytes: 0.05 10*3/uL (ref 0.00–0.07)
Basophils Absolute: 0 10*3/uL (ref 0.0–0.1)
Basophils Relative: 0 %
Eosinophils Absolute: 0.1 10*3/uL (ref 0.0–0.5)
Eosinophils Relative: 0 %
Immature Granulocytes: 0 %
Lymphocytes Relative: 5 %
Lymphs Abs: 0.7 10*3/uL (ref 0.7–4.0)
Monocytes Absolute: 0.8 10*3/uL (ref 0.1–1.0)
Monocytes Relative: 6 %
Neutro Abs: 10.7 10*3/uL — ABNORMAL HIGH (ref 1.7–7.7)
Neutrophils Relative %: 89 %

## 2020-11-21 LAB — RESP PANEL BY RT-PCR (FLU A&B, COVID) ARPGX2
Influenza A by PCR: NEGATIVE
Influenza B by PCR: NEGATIVE
SARS Coronavirus 2 by RT PCR: NEGATIVE

## 2020-11-21 LAB — CBG MONITORING, ED: Glucose-Capillary: 125 mg/dL — ABNORMAL HIGH (ref 70–99)

## 2020-11-21 IMAGING — DX DG CHEST 1V PORT
1 series · 1 of 1 positions shown · non-contrast
Comparison: [DATE]

CLINICAL DATA: Altered level of consciousness, seizures

EXAM:
PORTABLE CHEST 1 VIEW

[chest]
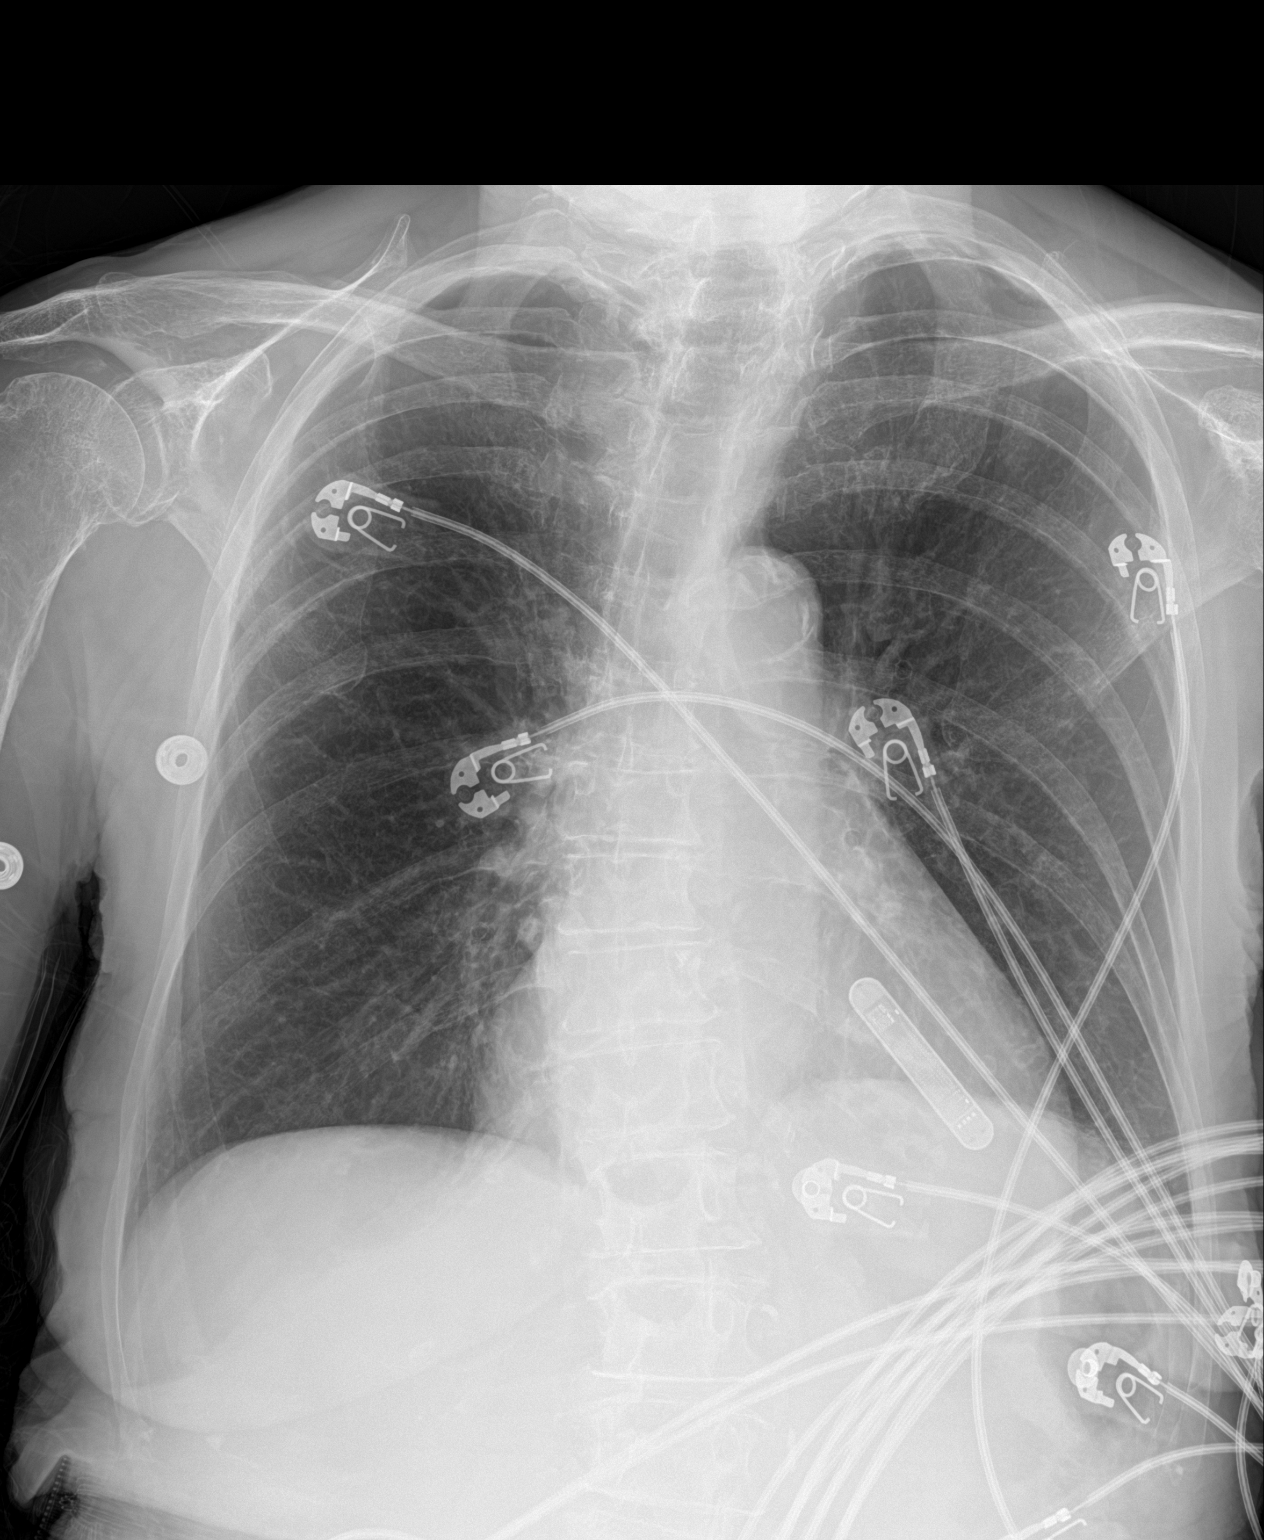

[1 of 1 positions shown; findings below may reference images not displayed]

FINDINGS: Single frontal view of the chest demonstrates an unremarkable
cardiac silhouette. Chronic interstitial changes without airspace
disease, effusion, or pneumothorax. No acute bony abnormalities.
IMPRESSION: 1. No acute intrathoracic process.  Stable exam.

## 2020-11-21 IMAGING — CT CT HEAD CODE STROKE
3 series · 15 of 47 positions shown, 18 images · non-contrast
Comparison: Prior study from [DATE].

CLINICAL DATA: Code stroke. Initial evaluation for acute
right-sided weakness.

EXAM:
CT HEAD WITHOUT CONTRAST
TECHNIQUE: Contiguous axial images were obtained from the base of the skull
through the vertex without intravenous contrast.

[Series 3: head 5.0 st · axial · 0.42mm/px · z∈[+1000,+1135]mm · 9 of 33 slices shown, 12 images]
[im 3/33  brain]
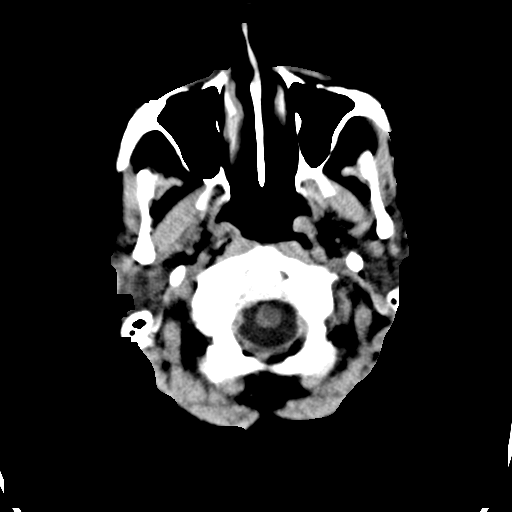
[im 3/33  bone]
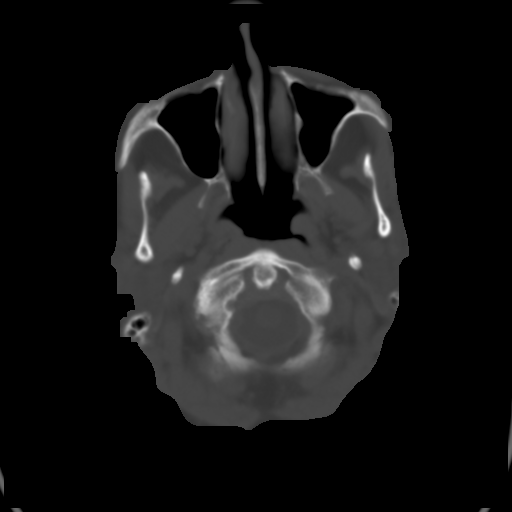
[im 6/33  brain]
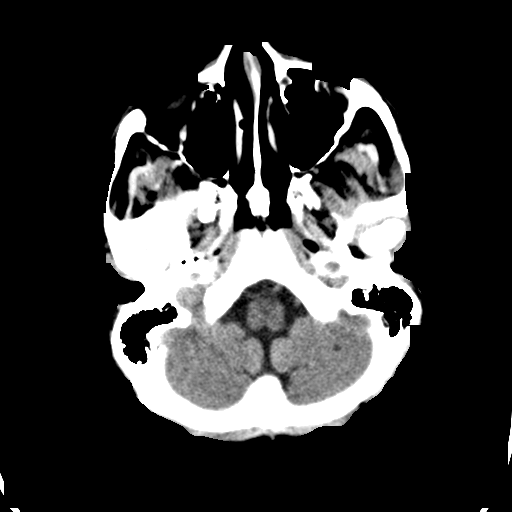
[im 9/33  brain]
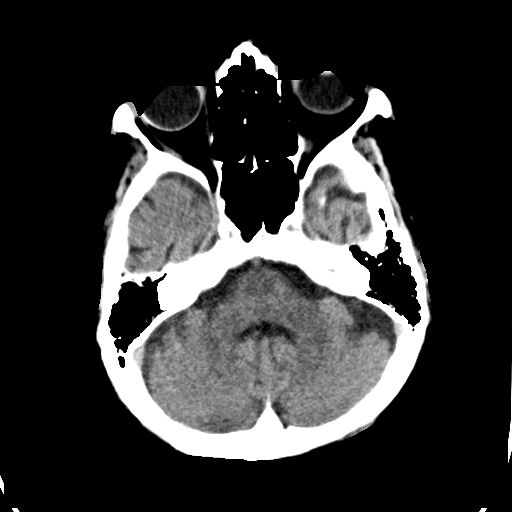
[im 13/33  brain]
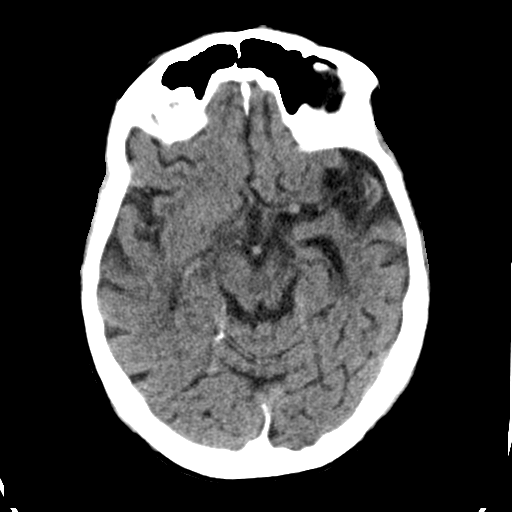
[im 17/33  brain]
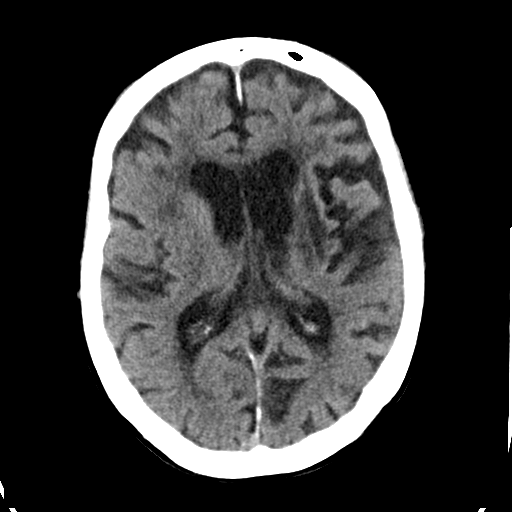
[im 17/33  bone]
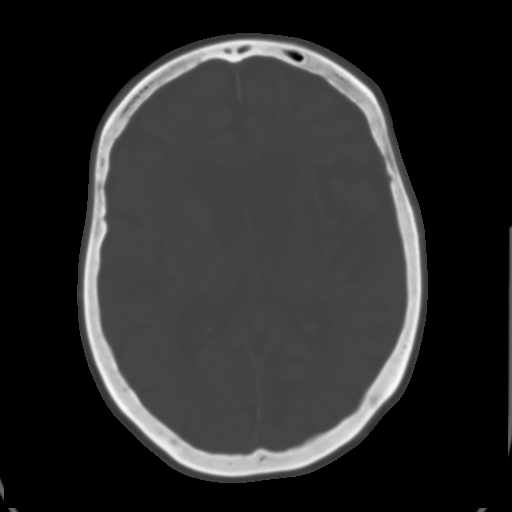
[im 20/33  brain]
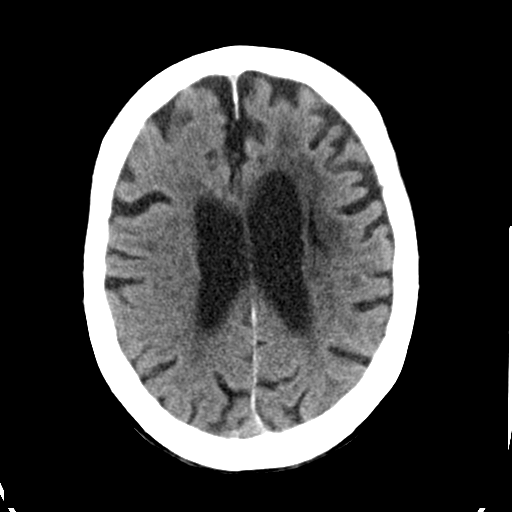
[im 24/33  brain]
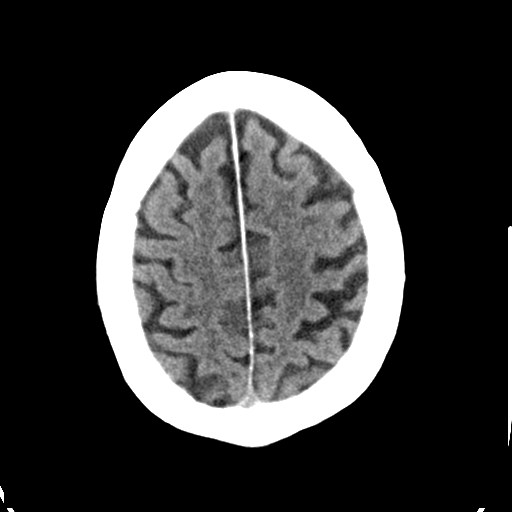
[im 27/33  brain]
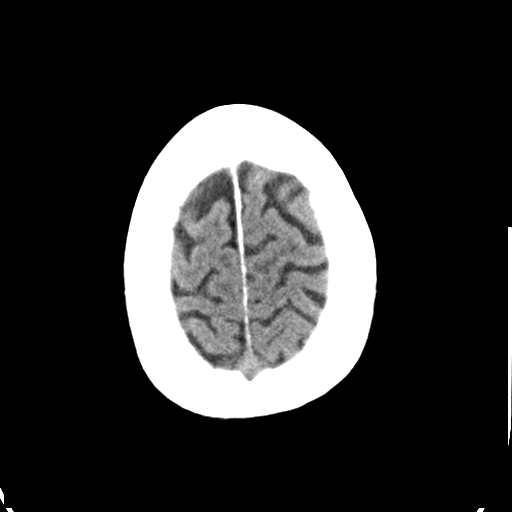
[im 30/33  brain]
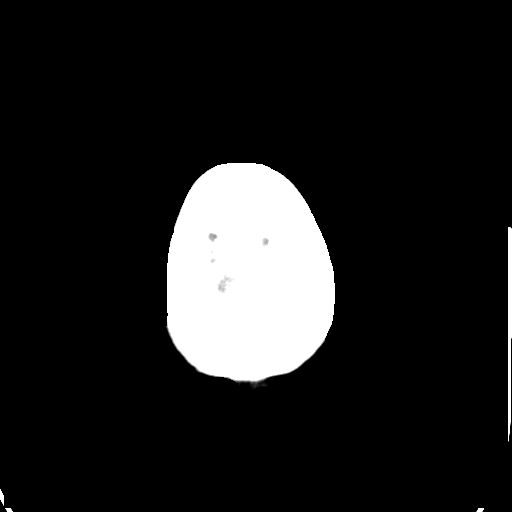
[im 30/33  bone]
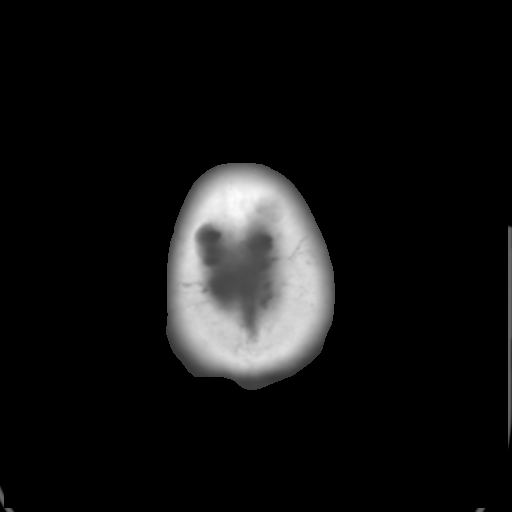

[Series 5: head 3.0 cor st · coronal · 0.32mm/px · 3 of 71 slices shown]
[im 24/71  brain]
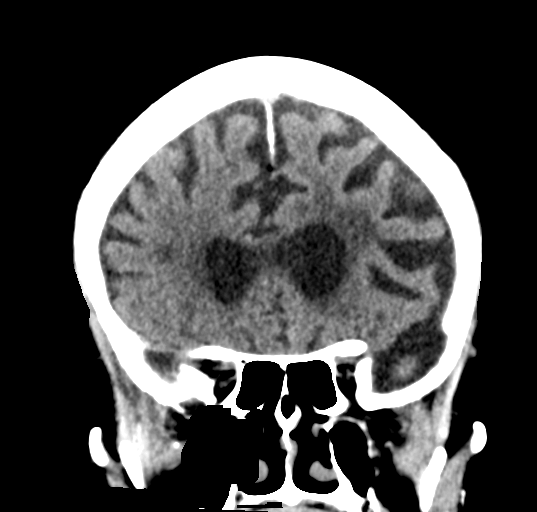
[im 32/71  brain]
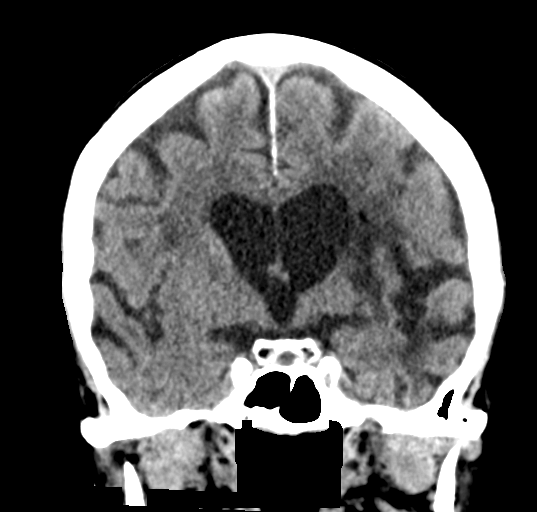
[im 39/71  brain]
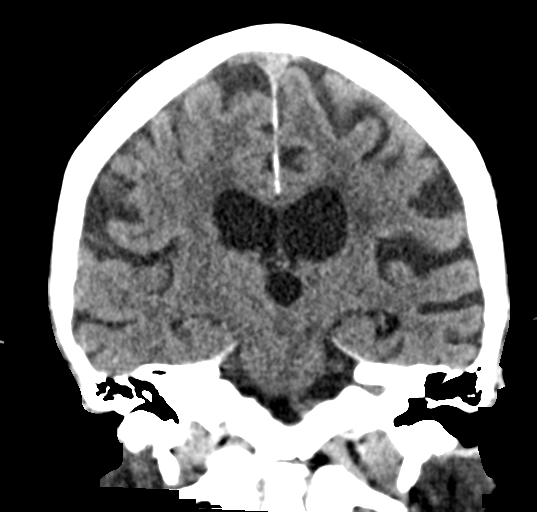

[Series 6: head 3.0 sag st · sagittal · 0.33mm/px · 3 of 58 slices shown]
[im 20/58  brain]
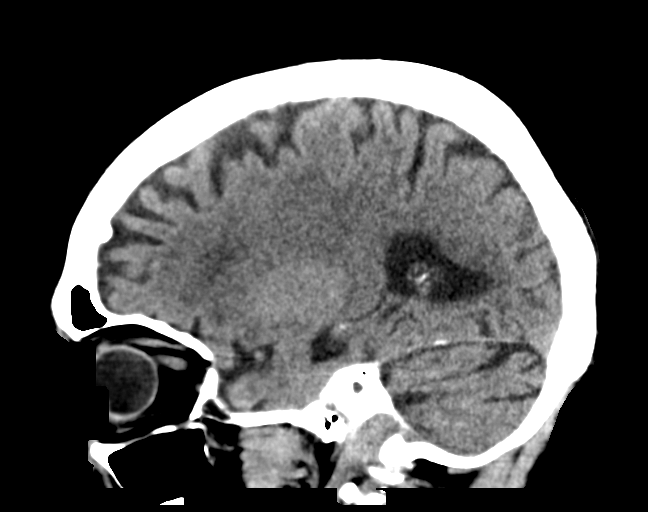
[im 29/58  brain]
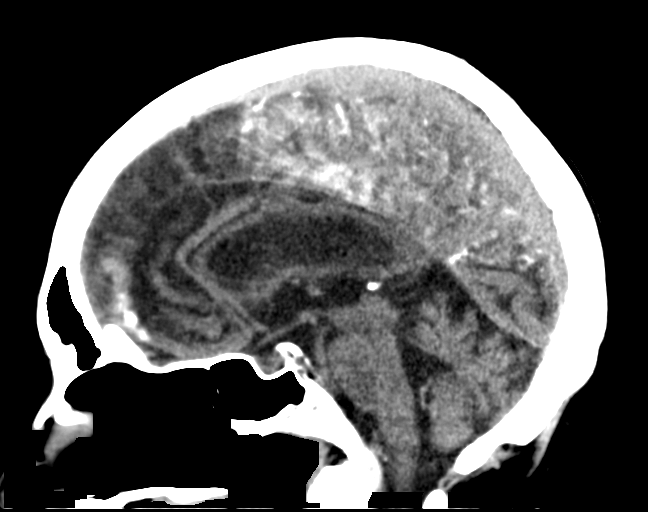
[im 39/58  brain]
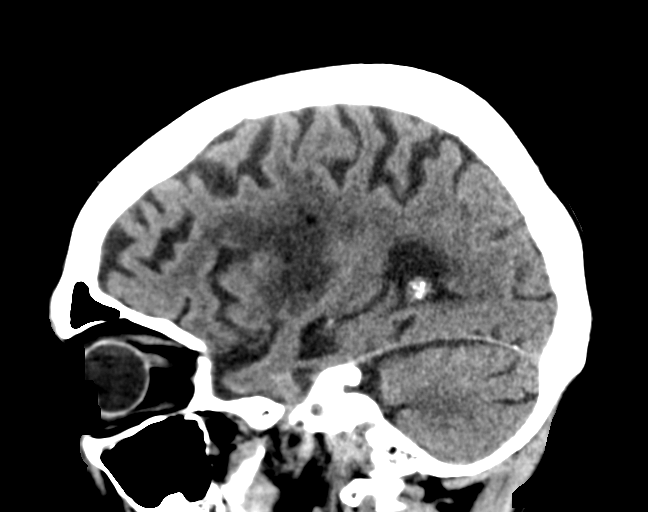

[15 of 47 positions shown; findings below may reference images not displayed]

FINDINGS: Brain: Generalized age-related cerebral atrophy with chronic small
vessel ischemic disease. Chronic left MCA territory infarcts
involving the left basal ganglia and adjacent left frontotemporal
region, corresponding with prior ischemic changes seen on prior
brain MRI. Remote lacunar infarct noted at the left thalamus.

No acute intracranial hemorrhage. No acute large vessel territory
infarct. No mass lesion, midline shift or mass effect. Mild
ventricular prominence related to global parenchymal volume loss
without hydrocephalus. No extra-axial fluid collection.

Vascular: No hyperdense vessel. Scattered vascular calcifications
noted within the carotid siphons.

Skull: Scalp soft tissues and calvarium within normal limits.

Sinuses/Orbits: Globes orbital soft tissues are normal. Paranasal
sinuses and mastoid air cells are clear.

Other: None.

ASPECTS (Alberta Stroke Program Early CT Score)

- Ganglionic level infarction (caudate, lentiform nuclei, internal
capsule, insula, M1-M3 cortex): 7

- Supraganglionic infarction (M4-M6 cortex): 3

Total score (0-10 with 10 being normal): 10
IMPRESSION: 1. No acute intracranial infarct or other abnormality.
2. ASPECTS is 10.
3. Chronic left MCA territory infarcts, with additional remote
lacunar infarct at the left thalamus.
4. Underlying atrophy with chronic small vessel ischemic disease.

These results were communicated to Dr. MINOR at [DATE] pmon
[DATE]by text page via the [employer] messaging system.

## 2020-11-21 IMAGING — MR MR HEAD W/O CM
13 of 15 series · 40 of 48 positions shown · non-contrast
Comparison: Prior CT from [DATE].

CLINICAL DATA: Initial evaluation for acute seizure.

EXAM:
MRI HEAD WITHOUT CONTRAST
TECHNIQUE: Multiplanar, multiecho pulse sequences of the brain and surrounding
structures were obtained without intravenous contrast.

[Series 5: DWI · axial · 3.0mm · 0.88mm/px · z∈[-106,+40]mm · 6 of 100 slices shown (1 of 4)]
[im 1/100]
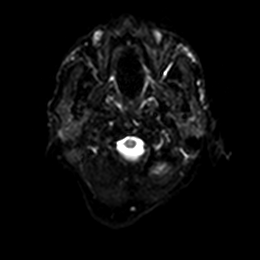
[im 20/100]
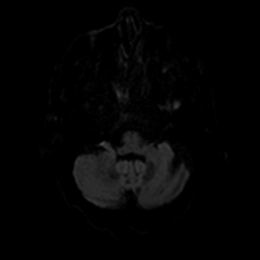
[im 40/100]
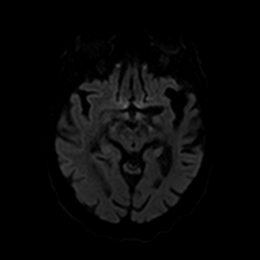
[im 60/100]
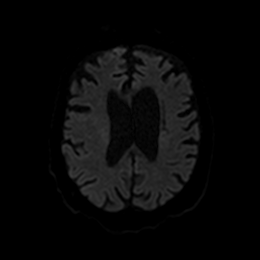
[im 80/100]
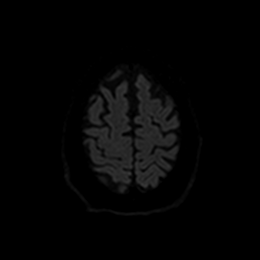
[im 100/100]
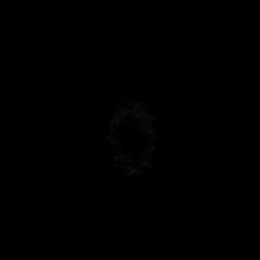

[Series 6: DWI · axial · 3.0mm · 0.88mm/px · z∈[-106,+40]mm · 4 of 50 slices shown (2 of 4)]
[im 1/50]
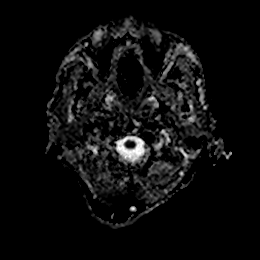
[im 17/50]
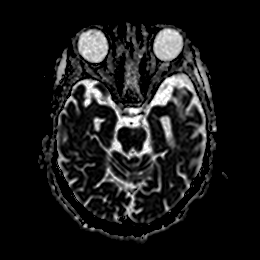
[im 33/50]
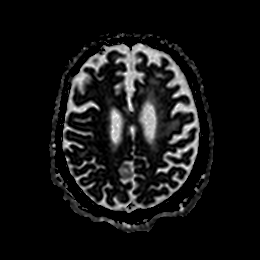
[im 50/50]
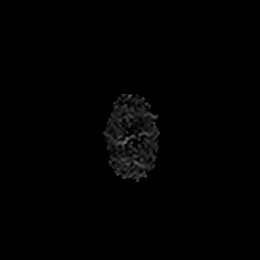

[Series 7: DWI · coronal · 4.0mm · 0.88mm/px · 5 of 68 slices shown (3 of 4)]
[im 1/68]
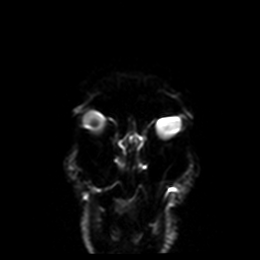
[im 17/68]
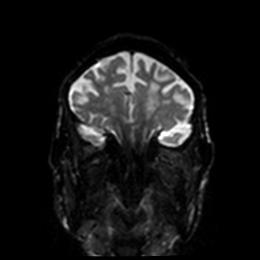
[im 34/68]
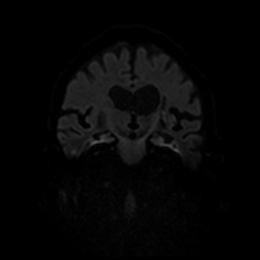
[im 51/68]
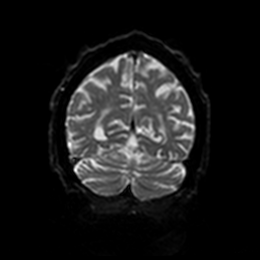
[im 68/68]
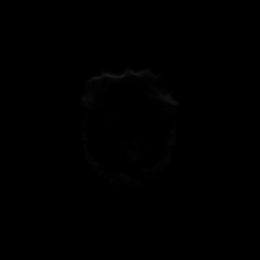

[Series 8: DWI · coronal · 4.0mm · 0.88mm/px · 2 of 34 slices shown (4 of 4)]
[im 1/34]
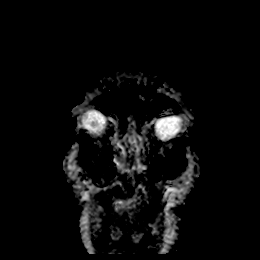
[im 34/34]
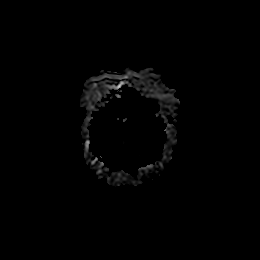

[Series 9: T1 · sagittal · 5.0mm · 0.75mm/px · 2 of 25 slices shown]
[im 1/25]
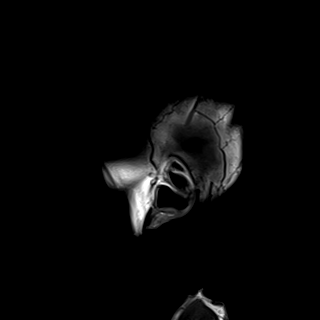
[im 25/25]
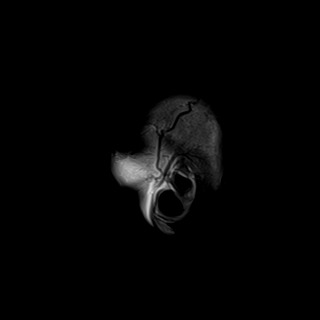

[Series 10: T2 · axial · 5.0mm · 0.72mm/px · z∈[-102,+42]mm · 2 of 25 slices shown (1 of 3)]
[im 1/25]
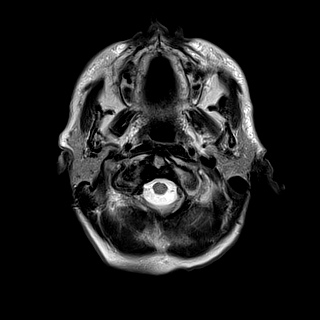
[im 25/25]
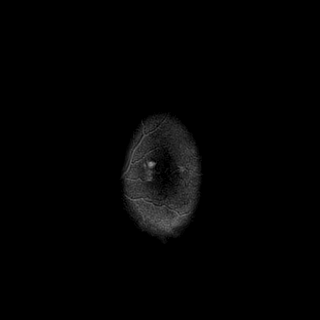

[Series 11: FLAIR · axial · 5.0mm · 0.45mm/px · z∈[-102,+42]mm · 2 of 25 slices shown (1 of 2)]
[im 1/25]
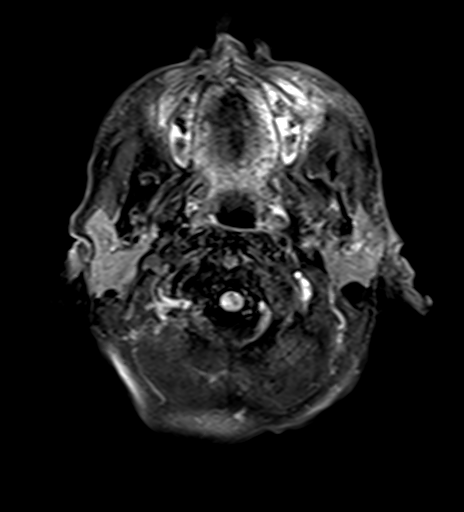
[im 25/25]
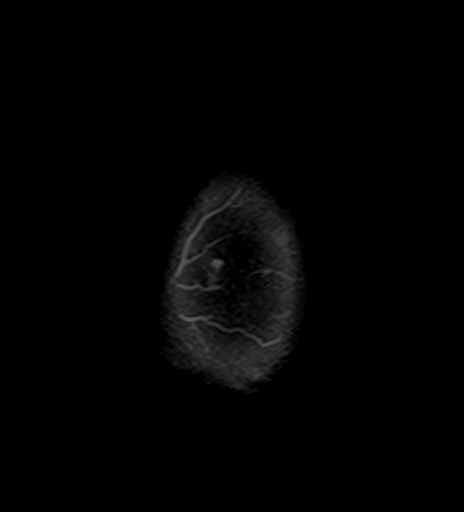

[Series 12: mag_images · axial · 3.0mm · 0.90mm/px · z∈[-106,+47]mm · 4 of 52 slices shown]
[im 1/52]
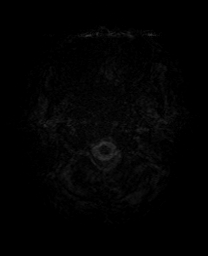
[im 18/52]
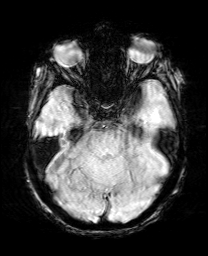
[im 35/52]
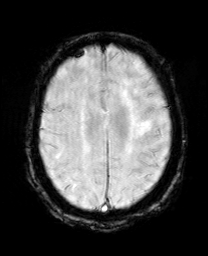
[im 52/52]
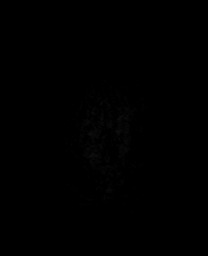

[Series 13: pha_images · axial · 3.0mm · 0.90mm/px · z∈[-106,+44]mm · 4 of 51 slices shown]
[im 1/51]
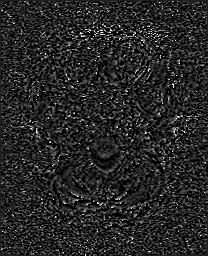
[im 17/51]
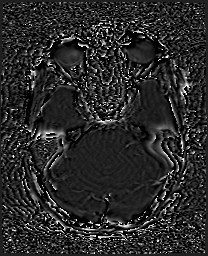
[im 34/51]
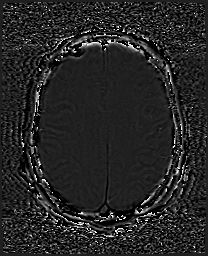
[im 51/51]
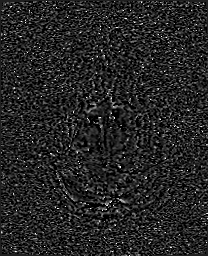

[Series 14: swi_images · axial · 3.0mm · 0.90mm/px · z∈[-106,-4]mm · 3 of 52 slices shown]
[im 1/52]
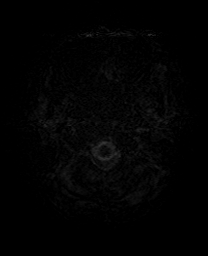
[im 18/52]
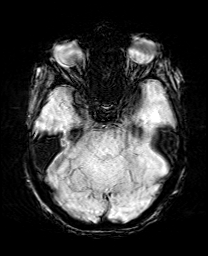
[im 35/52]
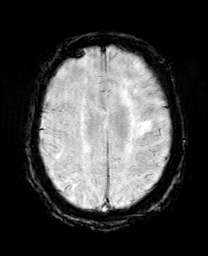

[Series 17: T2 · coronal · 3.0mm · 0.27mm/px · 2 of 32 slices shown (2 of 3)]
[im 1/32]
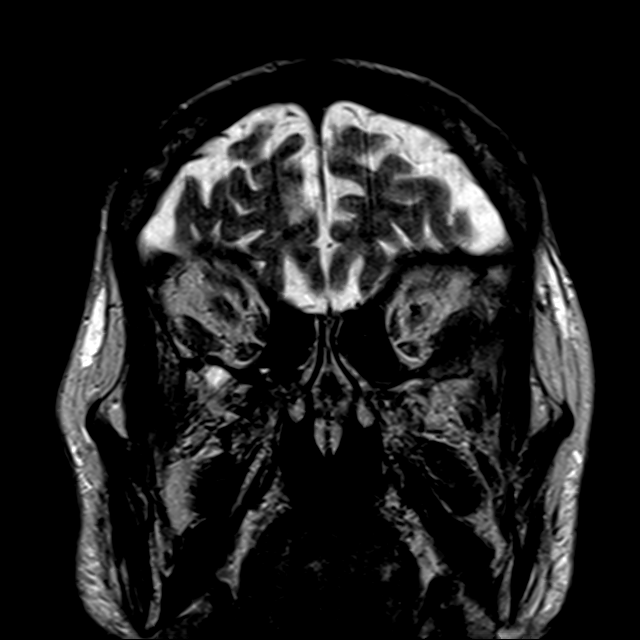
[im 32/32]
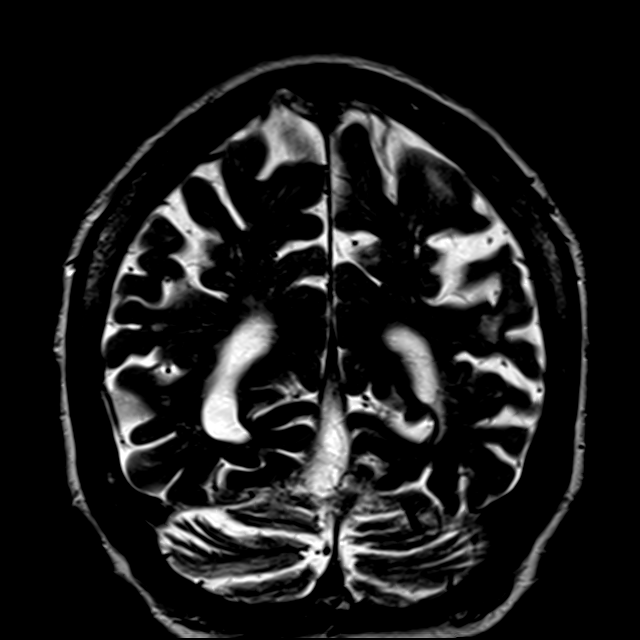

[Series 18: FLAIR · coronal · 3.0mm · 0.56mm/px · 2 of 32 slices shown (2 of 2)]
[im 1/32]
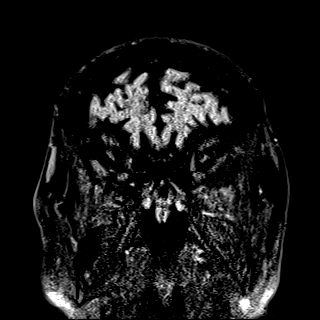
[im 32/32]
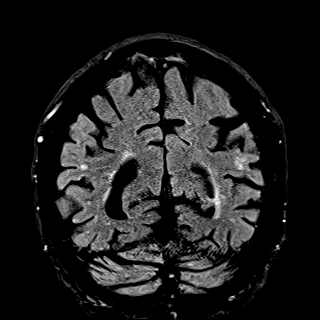

[Series 19: T2 · coronal · 5.0mm · 0.34mm/px · 2 of 29 slices shown (3 of 3)]
[im 1/29]
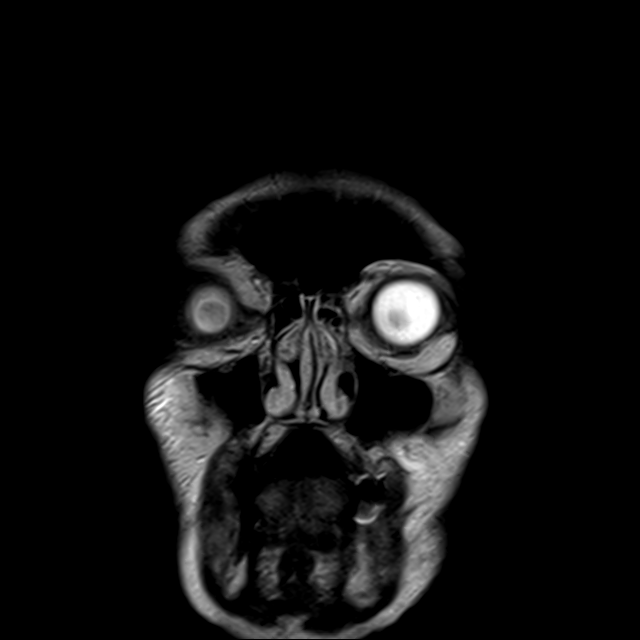
[im 29/29]
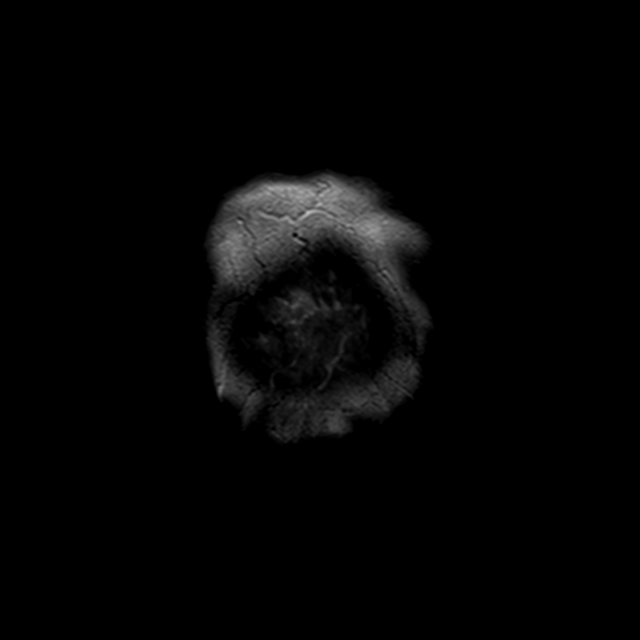

[40 of 48 positions shown; findings below may reference images not displayed]

FINDINGS: Brain: Diffuse prominence of the CSF containing spaces compatible
generalized age-related cerebral atrophy. Remote lacunar type
infarct noted involving the left basal ganglia/corona radiata.
Associated wallerian degeneration extends into the left cerebral
peduncle, left midbrain, with inferior extension into the left
ventrolateral brainstem. Additional area of encephalomalacia and
gliosis at the peripheral left temporal lobe also likely reflects
remote ischemic change. Small remote lacunar infarcts at the left
thalamus and left cerebellum.

No abnormal foci of restricted diffusion to suggest acute or
subacute ischemia or changes related to acute seizure. Gray-white
matter differentiation otherwise maintained. No other areas of
remote cortical infarction. Mild chronic hemosiderin staining noted
about the chronic left basal ganglia lacunar infarct. No other
evidence for acute or chronic intracranial hemorrhage.

8 mm meningioma overlies the left frontotemporal convexity without
associated mass effect (series 18, image 16). No other mass lesion,
mass effect, or midline shift. Diffuse ventricular prominence
related to global parenchymal volume loss without hydrocephalus. No
extra-axial fluid collection. Pituitary gland suprasellar region
within normal limits. Hippocampi symmetric and within normal limits
bilaterally.

Vascular: Attenuated flow voids within the left MCA branches as
compared to the right, similar to prior CTA. Major intracranial
vascular flow voids are otherwise maintained.

Skull and upper cervical spine: Degenerative thickening noted at the
tectorial membrane without significant stenosis. Craniocervical
junction otherwise unremarkable. Bone marrow signal intensity within
normal limits. No scalp soft tissue abnormality.

Sinuses/Orbits: Globes and orbital soft tissues within normal
limits. Paranasal sinuses are clear. No mastoid effusion. Inner ear
structures grossly normal.

Other: None.
IMPRESSION: 1. No acute intracranial abnormality.
2. Remote lacunar infarcts involving the left basal ganglia/corona
radiata, left thalamus, and left cerebellum, with additional remote
left temporal lobe infarct.
3. Attenuated flow voids within the left MCA branches as compared to
the right, consistent with previously identified left M1 occlusion.
Finding is grossly stable from prior CTA.
4. 8 mm meningioma overlying the left frontotemporal convexity
without associated mass effect.
5. Moderately advanced cerebral atrophy with chronic small vessel
ischemic disease.

## 2020-11-21 MED ORDER — SODIUM CHLORIDE 0.9% FLUSH
3.0000 mL | Freq: Two times a day (BID) | INTRAVENOUS | Status: DC
  Administered 2020-11-22: 3 mL via INTRAVENOUS

## 2020-11-21 MED ORDER — LEVETIRACETAM 750 MG PO TABS
750.0000 mg | ORAL_TABLET | Freq: Once | ORAL | Status: AC
  Administered 2020-11-22: 750 mg via ORAL
  Filled 2020-11-21: qty 1

## 2020-11-21 MED ORDER — ASPIRIN 325 MG PO TABS
325.0000 mg | ORAL_TABLET | Freq: Every day | ORAL | Status: DC
  Administered 2020-11-22: 325 mg via ORAL
  Filled 2020-11-21: qty 1

## 2020-11-21 MED ORDER — ENOXAPARIN SODIUM 40 MG/0.4ML ~~LOC~~ SOLN: 40.0000 mg | Freq: Every day | SUBCUTANEOUS | Status: DC

## 2020-11-21 MED ORDER — ACETAMINOPHEN 325 MG PO TABS: 650.0000 mg | ORAL_TABLET | Freq: Four times a day (QID) | ORAL | Status: DC | PRN

## 2020-11-21 MED ORDER — ATORVASTATIN CALCIUM 10 MG PO TABS
80.0000 mg | ORAL_TABLET | Freq: Every day | ORAL | Status: DC
  Administered 2020-11-22: 80 mg via ORAL
  Filled 2020-11-21: qty 8

## 2020-11-21 MED ORDER — LEVETIRACETAM 500 MG PO TABS
500.0000 mg | ORAL_TABLET | Freq: Two times a day (BID) | ORAL | Status: DC
  Administered 2020-11-22: 500 mg via ORAL
  Filled 2020-11-21: qty 1

## 2020-11-21 MED ORDER — SODIUM CHLORIDE 0.9% FLUSH
3.0000 mL | Freq: Once | INTRAVENOUS | Status: AC
Start: 2020-11-21 — End: 2020-11-22
  Administered 2020-11-22: 3 mL via INTRAVENOUS

## 2020-11-21 MED ORDER — ACETAMINOPHEN 650 MG RE SUPP: 650.0000 mg | Freq: Four times a day (QID) | RECTAL | Status: DC | PRN

## 2020-11-21 MED ORDER — LEVETIRACETAM 750 MG PO TABS
750.0000 mg | ORAL_TABLET | Freq: Two times a day (BID) | ORAL | Status: DC
  Filled 2020-11-21: qty 1

## 2020-11-21 NOTE — ED Provider Notes (Signed)
[facility] EMERGENCY DEPARTMENT Provider Note   CSN: [id number] Arrival date & time: 11/21/20  2123     History Chief Complaint  Patient presents with  . Code Stroke    Heather Nielsen is a 86 y.o. female.  Pt presents to the ED today as a code stroke.  The pt lives in independent living and there is not a clear LSN.  Family went to check on her today around 28 and she was having trouble speaking.  It is not clear why there was a delay, but EMS was not called until until closer to 2100.  EMS said she had some weakness in her right arm.  Pt is unable to give any hx as she is confused.  Pt met at the bridge by myself and Dr. Curly Shores (neurology).        Past Medical History:  Diagnosis Date  . Arthritis   . Complication of anesthesia   . CVA (cerebral vascular accident) ([place])   . Hypertension   . PONV (postoperative nausea and vomiting)   . Stroke ([place])   . Urinary incontinence     Patient Active Problem List   Diagnosis Date Noted  . CVA (cerebral vascular accident) ([place])   . Hypertension   . Hyperlipidemia 03/11/2020  . Aphasia   . Acute ischemic stroke ([place]) 03/03/2020  . OA (osteoarthritis) of hip 06/16/2015  . Osteoarthritis of right hip 12/03/2014  . Routine general medical examination at a health care facility 12/03/2014    Past Surgical History:  Procedure Laterality Date  . KNEE SURGERY  2007   menicus   . LOOP RECORDER INSERTION N/A 03/05/2020   Procedure: LOOP RECORDER INSERTION;  Surgeon: Thompson Grayer, MD;  Location: [facility];  Service: Cardiovascular;  Laterality: N/A;  . ROTATOR CUFF REPAIR Right 2006  . TOTAL HIP ARTHROPLASTY Right 06/16/2015   Procedure: RIGHT TOTAL HIP ARTHROPLASTY ANTERIOR APPROACH;  Surgeon: Gaynelle Arabian, MD;  Location: [facility];  Service: Orthopedics;  Laterality: Right;  . TOTAL HIP ARTHROPLASTY Left 09/06/2016   Procedure: LEFT TOTAL HIP ARTHROPLASTY ANTERIOR APPROACH;  Surgeon: Gaynelle Arabian, MD;   Location: [facility];  Service: Orthopedics;  Laterality: Left;     OB History   No obstetric history on file.     Family History  Problem Relation Age of Onset  . Heart disease Mother        died age 9  . CAD Brother 61    Social History   Tobacco Use  . Smoking status: Former Smoker    Packs/day: 0.25    Years: 25.00    Pack years: 6.25    Types: Cigarettes    Quit date: 12/18/1984    Years since quitting: 35.9  . Smokeless tobacco: Never Used  Substance Use Topics  . Alcohol use: Yes    Alcohol/week: 0.0 standard drinks    Comment: gin and tonic and glass of wine daily   . Drug use: No    Home Medications Prior to Admission medications   Medication Sig Start Date End Date Taking? Authorizing Provider  aspirin 325 MG tablet Take 1 tablet (325 mg total) by mouth daily. 03/07/20   Swayze, Ava, DO  atorvastatin (LIPITOR) 80 MG tablet Take 1 tablet (80 mg total) by mouth daily. 04/28/20   Isaac Bliss, Rayford Halsted, MD  latanoprost (XALATAN) 0.005 % ophthalmic solution Place 1 drop into the right eye daily.    [provider]  lisinopril (ZESTRIL)  10 MG tablet TAKE ONE TABLET BY MOUTH DAILY 09/24/20   Isaac Bliss, Rayford Halsted, MD    Allergies    Penicillins and Pneumococcal vaccines  Review of Systems   Review of Systems  Unable to perform ROS: Mental status change  All other systems reviewed and are negative.   Physical Exam Updated Vital Signs BP (!) 150/58   Pulse 72   Resp 17   SpO2 100%   Physical Exam Vitals and nursing note reviewed.  Constitutional:      Appearance: Normal appearance.  HENT:     Head: Normocephalic and atraumatic.     Right Ear: External ear normal.     Left Ear: External ear normal.     Nose: Nose normal.     Mouth/Throat:     Mouth: Mucous membranes are moist.     Pharynx: Oropharynx is clear.  Eyes:     Extraocular Movements: Extraocular movements intact.     Conjunctiva/sclera: Conjunctivae normal.     Pupils:  Pupils are equal, round, and reactive to light.  Cardiovascular:     Rate and Rhythm: Normal rate and regular rhythm.     Pulses: Normal pulses.     Heart sounds: Normal heart sounds.  Pulmonary:     Effort: Pulmonary effort is normal.     Breath sounds: Normal breath sounds.  Abdominal:     General: Abdomen is flat. Bowel sounds are normal.     Palpations: Abdomen is soft.  Musculoskeletal:        General: Normal range of motion.     Cervical back: Normal range of motion and neck supple.  Skin:    General: Skin is warm.     Capillary Refill: Capillary refill takes less than 2 seconds.  Neurological:     Mental Status: She is alert. She is disoriented.  Psychiatric:        Mood and Affect: Mood normal.        Behavior: Behavior normal.     ED Results / Procedures / Treatments   Labs (all labs ordered are listed, but only abnormal results are displayed) Labs Reviewed  CBC - Abnormal; Notable for the following components:      Result Value   WBC 12.3 (*)    All other components within normal limits  DIFFERENTIAL - Abnormal; Notable for the following components:   Neutro Abs 10.7 (*)    All other components within normal limits  COMPREHENSIVE METABOLIC PANEL - Abnormal; Notable for the following components:   Sodium 134 (*)    Glucose, Bld 141 (*)    Total Protein 6.4 (*)    All other components within normal limits  I-STAT CHEM 8, ED - Abnormal; Notable for the following components:   Glucose, Bld 135 (*)    All other components within normal limits  CBG MONITORING, ED - Abnormal; Notable for the following components:   Glucose-Capillary 125 (*)    All other components within normal limits  RESP PANEL BY RT-PCR (FLU A&B, COVID) ARPGX2  PROTIME-INR  APTT  URINALYSIS, ROUTINE W REFLEX MICROSCOPIC  RAPID URINE DRUG SCREEN, HOSP PERFORMED  MAGNESIUM  PHOSPHORUS    EKG EKG Interpretation  Date/Time:  Sunday November 21 2020 21:49:26 EST Ventricular Rate:  68 PR  Interval:    QRS Duration: 90 QT Interval:  439 QTC Calculation: 467 R Axis:   69 Text Interpretation: Sinus rhythm No significant change since last tracing Confirmed by Isla Pence [phone]) on 11/21/2020  10:05:08 PM   Radiology DG Chest Portable 1 View  Result Date: 11/21/2020 CLINICAL DATA:  Altered level of consciousness, seizures EXAM: PORTABLE CHEST 1 VIEW COMPARISON:  03/04/2020 FINDINGS: Single frontal view of the chest demonstrates an unremarkable cardiac silhouette. Chronic interstitial changes without airspace disease, effusion, or pneumothorax. No acute bony abnormalities. IMPRESSION: 1. No acute intrathoracic process.  Stable exam. Electronically Signed   By: Randa Ngo M.D.   On: 11/21/2020 22:10   CT HEAD CODE STROKE WO CONTRAST  Result Date: 11/21/2020 CLINICAL DATA:  Code stroke. Initial evaluation for acute right-sided weakness. EXAM: CT HEAD WITHOUT CONTRAST TECHNIQUE: Contiguous axial images were obtained from the base of the skull through the vertex without intravenous contrast. COMPARISON:  Prior study from 03/04/2020. FINDINGS: Brain: Generalized age-related cerebral atrophy with chronic small vessel ischemic disease. Chronic left MCA territory infarcts involving the left basal ganglia and adjacent left frontotemporal region, corresponding with prior ischemic changes seen on prior brain MRI. Remote lacunar infarct noted at the left thalamus. No acute intracranial hemorrhage. No acute large vessel territory infarct. No mass lesion, midline shift or mass effect. Mild ventricular prominence related to global parenchymal volume loss without hydrocephalus. No extra-axial fluid collection. Vascular: No hyperdense vessel. Scattered vascular calcifications noted within the carotid siphons. Skull: Scalp soft tissues and calvarium within normal limits. Sinuses/Orbits: Globes orbital soft tissues are normal. Paranasal sinuses and mastoid air cells are clear. Other: None. ASPECTS  [facility] Stroke Program Early CT Score) - Ganglionic level infarction (caudate, lentiform nuclei, internal capsule, insula, M1-M3 cortex): 7 - Supraganglionic infarction (M4-M6 cortex): 3 Total score (0-10 with 10 being normal): 10 IMPRESSION: 1. No acute intracranial infarct or other abnormality. 2. ASPECTS is 10. 3. Chronic left MCA territory infarcts, with additional remote lacunar infarct at the left thalamus. 4. Underlying atrophy with chronic small vessel ischemic disease. These results were communicated to Dr. Curly Shores at 9:42 pmon 12/5/2021by text page via the [facility] messaging system. Electronically Signed   By: Jeannine Boga M.D.   On: 11/21/2020 21:44    Procedures Procedures (including critical care time)  Medications Ordered in ED Medications  sodium chloride flush (NS) 0.9 % injection 3 mL (has no administration in time range)  levETIRAcetam (KEPPRA) tablet 750 mg (has no administration in time range)    ED Course  I have reviewed the triage vital signs and the nursing notes.  Pertinent labs & imaging results that were available during my care of the patient were reviewed by me and considered in my medical decision making (see chart for details).    MDM Rules/Calculators/A&P                          Pt d/w Dr. Curly Shores.  PT does not meet criteria for tpa as LSN is unclear.  Dr. Curly Shores requests a MRI and has ordered an EEG.  She suspects pt may be having seizures from her prior stroke.  She requests admission by the hospitalist service.  Pt d/w Dr. Trilby Drummer (triad) for admission.  CRITICAL CARE Performed by: Isla Pence   Total critical care time: 30 minutes  Critical care time was exclusive of separately billable procedures and treating other patients.  Critical care was necessary to treat or prevent imminent or life-threatening deterioration.  Critical care was time spent personally by me on the following activities: development of treatment plan with patient  and/or surrogate as well as nursing, discussions with consultants, evaluation of patient's response  to treatment, examination of patient, obtaining history from patient or surrogate, ordering and performing treatments and interventions, ordering and review of laboratory studies, ordering and review of radiographic studies, pulse oximetry and re-evaluation of patient's condition. Final Clinical Impression(s) / ED Diagnoses Final diagnoses:  Altered mental status, unspecified altered mental status type    Rx / DC Orders ED Discharge Orders    None       Isla Pence, MD 11/21/20 2311

## 2020-11-21 NOTE — ED Notes (Signed)
Pt taken to MRI; Kennon Rounds, daughter, contact info in chart to call if needed

## 2020-11-21 NOTE — Consult Note (Addendum)
Neurology Consultation Reason for Consult: Code stroke for aphasia and right hand weakness Requesting Physician: Jacalyn Lefevre   CC: Right leg shaking  History is obtained from: Patietnt, daughter and EMS  HPI: Heather Nielsen is a 86 y.o. woman with a past medical history significant for left MCA territory stroke (02/2020, residual moderate aphasia and mild right hand weakness), hypertension, remote tobacco abuse, arthritis, urinary incontinence.  Majority of the history is obtained from the patient's daughter Heather Nielsen.  Ms. Heather Nielsen reports that for the past 3 weeks the patient has had labile affect with easy crying/laughter.  She lives independently and has refused daily checks from her independent living facility, as she is fiercely independent.  She manages her own medications, but does have some difficulties keeping up with her housekeeping such as cooking and cleaning.  Typically her daughter check on her once or twice a week.  Ms. Thompson's sister checked on the patient 2 days ago and felt that her right hand looked funny (black and blue), unclear if the patient was having increased difficulty with her speech or increased weakness at that time.  Therefore Ms. Heather Nielsen went to check on the patient today.  She did note that the right hand was not moving as well as normally and she was a bit disoriented had difficulty naming her daughter (her aphasia is not typically so severe); in addition she seemed even more emotionally labile than she has been in the past few weeks.  However she has been having waxing and waning speech issues, and daughter notes she refused speech therapy.  She called her family for help around 8:30 PM, which prompted the daughter to activate EMS.  On EMS arrival she was noted to be on the toilet with a significant amount of diarrhea in the toilet but that she had also soiled herself.  Family has not noted any episodes of tongue biting, but they note that she did have  another episode of soiling herself 2 months ago.  Again at that time there was significant amount of stool in the toilet but also on patient's clothes.  They deny any recent signs or symptoms of infection that the patient has reported to them with that they have noticed.  Ms. Heather Nielsen does report she is concerned her mother may not be taking her medications as prescribed, given that she lives independently and is generally reluctant to participate with medical care.  At this time, patient denies any pain to me, but is able to report that she has had some right leg shaking.  She appears to report that she is also been having some trouble with the right hand but does not endorse right hand shaking.  History from the patient is significantly limited by her aphasia.  LKW: days to weeks, 1 PM on 12/5 at best  tPA given?: No, due to out of the window Premorbid modified rankin scale: 2-3     2 - Slight disability. Able to look after own affairs without assistance, but unable to carry out all previous activities.     3 - Moderate disability. Requires some help, but able to walk unassisted.  ROS: Unable to obtain due to altered mental status, limited ROS from family as above   Past Medical History:  Diagnosis Date  . Arthritis   . Complication of anesthesia   . CVA (cerebral vascular accident) ([facility])   . Hypertension   . PONV (postoperative nausea and vomiting)   . Stroke ([facility])   .  Urinary incontinence    Past Surgical History:  Procedure Laterality Date  . KNEE SURGERY  2007   menicus   . LOOP RECORDER INSERTION N/A 03/05/2020   Procedure: LOOP RECORDER INSERTION;  Surgeon: Hillis Range, MD;  Location: [facility];  Service: Cardiovascular;  Laterality: N/A;  . ROTATOR CUFF REPAIR Right 2006  . TOTAL HIP ARTHROPLASTY Right 06/16/2015   Procedure: RIGHT TOTAL HIP ARTHROPLASTY ANTERIOR APPROACH;  Surgeon: Ollen Gross, MD;  Location: [facility];  Service: Orthopedics;  Laterality: Right;  .  TOTAL HIP ARTHROPLASTY Left 09/06/2016   Procedure: LEFT TOTAL HIP ARTHROPLASTY ANTERIOR APPROACH;  Surgeon: Ollen Gross, MD;  Location: [facility];  Service: Orthopedics;  Laterality: Left;   Current Outpatient Medications  Medication Instructions  . aspirin 325 mg, Oral, Daily  . atorvastatin (LIPITOR) 80 mg, Oral, Daily  . latanoprost (XALATAN) 0.005 % ophthalmic solution 1 drop, Right Eye, Daily  . lisinopril (ZESTRIL) 10 MG tablet TAKE ONE TABLET BY MOUTH DAILY     Family History  Problem Relation Age of Onset  . Heart disease Mother        died age 60  . CAD Brother 20   Social History:  reports that she quit smoking about 35 years ago. Her smoking use included cigarettes. She has a 6.25 pack-year smoking history. She has never used smokeless tobacco. She reports current alcohol use. She reports that she does not use drugs.  Exam: Current vital signs: BP (!) 150/58   Pulse 72   Resp 17   SpO2 100%  Vital signs in last 24 hours: Pulse Rate:  [69-72] 72 (12/05 2230) Resp:  [17] 17 (12/05 2230) BP: (137-150)/(58-65) 150/58 (12/05 2230) SpO2:  [97 %-100 %] 100 % (12/05 2230)   Physical Exam  Constitutional: Appears well-developed and well-nourished.  Psych: Labile affect Eyes: No scleral injection HENT: No OP obstruction, no evidence of tongue bite MSK: no joint deformities.  Cardiovascular: Normal rate and regular rhythm.  Respiratory: Effort normal, non-labored breathing GI: Soft.  No distension. There is no tenderness.  Skin: Warm and dry, no large abrasions on visible skin  Neuro: Mental Status: Patient is awake, alert, but cannot report her name or the month correctly. Patient is able to give very limited history as above, and appears to have a productive more than receptive aphasia, without neglect Cranial Nerves: II: Visual Fields are notable for possible RUQ field cut (patient would not report number of fingers without fixating to that quadrant on multiple  attempts to test). Pupils are equal, round, and reactive to light.  III,IV, VI: EOMI without ptosis  V: Facial sensation is symmetric to temperature VII: Facial movement is notable for a mild right facial droop that improved on subsequent evaluation.  VIII: hearing is intact to voice X: Uvula elevates symmetrically XI: Shoulder shrug is symmetric. XII: tongue is midline without atrophy or fasciculations.  Motor: On initial evaluation she did have a right upper extremity drift.  Confrontational testing was limited due to her aphasia.  On repeat examinations, her ability to move her right hand and fingers was improving. Sensory: Possibly reduced in the RUE, patient had difficulty articulating completely. Not totally absent.  Reflexes: Increased (3+) on right biceps and brachioradialis compared to the left (2+) Cerebellar: Limited by aphasia  NIHSS total 7 Score breakdown:  2 points for level of consciousness questions, 1 point for possible right upper quadrant field loss, 1 point for mild right nasolabial fold flattening, 2 points for  severe aphasia, 1 point for mild dysarthria  I have reviewed labs in epic and the results pertinent to this consultation are: Creatinine of 0.6 Glucose 135 Leukocytosis 12.3 Electrolytes within normal range, magnesium pending  I have reviewed the images obtained: Head CT without acute intracranial process  Impression: This is an 86 year old woman with a prior left MCA stroke in March (cryptogenic), presenting with worsening left MCA symptoms.  Differential is post stroke epilepsy versus recrudescence in the setting of infection.  Will obtain repeat MRI and EEG to complete seizure work-up.  Infectious work-up as below.  Suspect her waxing and waning speech and prior episode of fecal incontinence were also secondary to focal seizures potentially with secondary generalization.  Discussed medication and side effects with daughter Heather Nielsen.  Recommendations: - MRI brain w/o contrast, seizure protocol  - Routine EEG - Keppra 750 mg once, then 500 mg BID starting 12/6 - Screening for potential triggers: UA, U tox, chest x-ray, CBC, CMP, magnesium, phosphorus -Continue to monitor for signs and symptoms of infection - Appreciate social work support and primary team's attention to family's concerns about patient being able to live independently and be compliant with her medications - Seizure Precautions: Per [employer] statutes, patients with seizures are not allowed to drive until  they have been seizure-free for six months. Use caution when using heavy equipment or power tools. Avoid working on ladders or at heights. Take showers instead of baths. Ensure the water temperature is not too high on the home water heater. Do not go swimming alone. When caring for infants or small children, sit down when holding, feeding, or changing them to minimize risk of injury to the child in the event you have a seizure.  To reduce risk of seizures, maintain good sleep hygiene avoid alcohol and illicit drug use, take all anti-seizure medications as prescribed.   Brooke Dare MD-PhD [facility] [phone]

## 2020-11-21 NOTE — H&P (Signed)
History and Physical   Heather Nielsen YSA:[id number] DOB: 25-Mar-1934 DOA: 11/21/2020  PCP: Philip Aspen, Limmie Patricia, MD   Patient coming from: Independent living  Chief Complaint: Abnormal speech, right hand weakness  HPI: Heather Nielsen is a 86 y.o. female with medical history significant of CVA, HLD, HTN, OA presenting with speech deficits and right hand weakness.  Patient initially presented as a code stroke due to aphasia and right hand weakness.  This was canceled after neurology evaluation.  Patient lives at independent living and as above had a prior stroke with right-sided weakness and aphasia as her deficits.  Her aphasia had improved but never returned to baseline and she has chronic waxing and waning aphasia now per daughter.  She had no clear last seen normal per report, but upon interview of patient and her family it sounds like she was without deficits at 1 PM when the daughter went to visit her, but she was more emotional than normal at that time.  Per patient report it was later in the evening sometime between 6 and 8 PM when she began to have worsening speech difficulties and right-sided weakness she was also noted to have an episode of stool incontinence at that time.  As above this aphasia and right-sided weakness are similar to her deficits from her previous stroke.  She denies fever, cough, chest pain, shortness of breath, abdominal pain.  ED Course: Vital signs in the ED significant for blood pressure in the 140 systolic.  Lab work showed BMP with glucose 141, LFTs with protein of 6.4, CBC with WBC 12.3 (89% neutrophils).  PT PTT and INR within normal limits.  Respiratory panel for flu and Covid pending, urinalysis pending.  Chest x-ray was without acute abnormality, CT head was without acute abnormality.  Patient was seen by neurology in the ED and code stroke was canceled patient is suspected to have seizures related to her prior stroke instead of new stroke.  Review of Systems:  As per HPI otherwise all other systems reviewed and are negative.  Past Medical History:  Diagnosis Date  . Arthritis   . Complication of anesthesia   . CVA (cerebral vascular accident) (HCC)   . Hypertension   . PONV (postoperative nausea and vomiting)   . Stroke (HCC)   . Urinary incontinence     Past Surgical History:  Procedure Laterality Date  . KNEE SURGERY  2007   [place]   . LOOP RECORDER INSERTION N/A 03/05/2020   Procedure: LOOP RECORDER INSERTION;  Surgeon: Hillis Range, MD;  Location: [facility];  Service: Cardiovascular;  Laterality: N/A;  . ROTATOR CUFF REPAIR Right 2006  . TOTAL HIP ARTHROPLASTY Right 06/16/2015   Procedure: RIGHT TOTAL HIP ARTHROPLASTY ANTERIOR APPROACH;  Surgeon: Ollen Gross, MD;  Location: [facility];  Service: Orthopedics;  Laterality: Right;  . TOTAL HIP ARTHROPLASTY Left 09/06/2016   Procedure: LEFT TOTAL HIP ARTHROPLASTY ANTERIOR APPROACH;  Surgeon: Ollen Gross, MD;  Location: [facility];  Service: Orthopedics;  Laterality: Left;    Social History  reports that she quit smoking about 35 years ago. Her smoking use included cigarettes. She has a 6.25 pack-year smoking history. She has never used smokeless tobacco. She reports current alcohol use. She reports that she does not use drugs.  Allergies  Allergen Reactions  . Penicillins Other (See Comments)    Has patient had a PCN reaction causing immediate rash, facial/tongue/throat swelling, SOB or lightheadedness with hypotension:No Has patient had a PCN reaction  causing severe rash involving mucus membranes or skin necrosis:No Has patient had a PCN reaction that required hospitalization:No Has patient had a PCN reaction occurring within the last 10 years:No If all of the above answers are "NO", then may proceed with Cephalosporin use. Urinary issues--pt.says "felt like razor blades in her bladder"  . Pneumococcal Vaccines Hives, Itching and Rash    Family History  Problem Relation Age  of Onset  . Heart disease Mother        died age 84  . CAD Brother 1165  Reviewed on admission  Prior to Admission medications   Medication Sig Start Date End Date Taking? Authorizing Provider  aspirin 325 MG tablet Take 1 tablet (325 mg total) by mouth daily. 03/07/20   Swayze, Ava, DO  atorvastatin (LIPITOR) 80 MG tablet Take 1 tablet (80 mg total) by mouth daily. 04/28/20   Philip AspenHernandez Acosta, Limmie PatriciaEstela Y, MD  latanoprost (XALATAN) 0.005 % ophthalmic solution Place 1 drop into the right eye daily.    [provider]  lisinopril (ZESTRIL) 10 MG tablet TAKE ONE TABLET BY MOUTH DAILY 09/24/20   Philip AspenHernandez Acosta, Limmie PatriciaEstela Y, MD    Physical Exam: Vitals:   11/21/20 2230 11/21/20 2245 11/21/20 2300 11/21/20 2315  BP: (!) 150/58 139/64 (!) 157/66 (!) 148/71  Pulse: 72 71 72 71  Resp: 17 17 18 18   SpO2: 100% 98% 98% 100%   Physical Exam Constitutional:      General: She is not in acute distress.    Appearance: Normal appearance.     Comments: Then, elderly female  HENT:     Head: Normocephalic and atraumatic.     Mouth/Throat:     Mouth: Mucous membranes are moist.     Pharynx: Oropharynx is clear.  Eyes:     Extraocular Movements: Extraocular movements intact.     Pupils: Pupils are equal, round, and reactive to light.  Cardiovascular:     Rate and Rhythm: Normal rate and regular rhythm.     Pulses: Normal pulses.     Heart sounds: Normal heart sounds.  Pulmonary:     Effort: Pulmonary effort is normal. No respiratory distress.     Breath sounds: Normal breath sounds.  Abdominal:     General: Bowel sounds are normal. There is no distension.     Palpations: Abdomen is soft.     Tenderness: There is no abdominal tenderness.  Musculoskeletal:        General: No swelling or deformity.  Skin:    General: Skin is warm and dry.  Neurological:     Comments: Mental Status: Patient is awake, alert, oriented x2-3 (aphasia limiting response to time) Aphasia noted, improving per  family report Cranial Nerves: II: Pupils equal, round, and reactive to light.  III,IV, VI: EOMI without ptosis or diploplia.  V: Facial sensation is symmetric tolight touch VII: Facial movement is symmetric.  VIII: hearing is intact to voice X: Uvula elevates symmetrically XI: Shoulder shrug is symmetric. XII: tongue is midline without atrophy or fasciculations.  Motor: Good effort thorughout, at Least 4-5/5 bilateral UE, 4-5/5 bilateral lower extremitiy Sensory: Sensation is grossly intact bilateral UEs & LEs Cerebellar: Finger-Nose intact bilat, some difficulty with the instructions initially    Labs on Admission: I have personally reviewed following labs and imaging studies  CBC: Recent Labs  Lab 11/21/20 2129 11/21/20 2143  WBC 12.3*  --   NEUTROABS 10.7*  --   HGB 12.0 12.9  HCT 36.4 38.0  MCV  93.1  --   PLT 226  --     Basic Metabolic Panel: Recent Labs  Lab 11/21/20 2129 11/21/20 2143  NA 134* 135  K 3.9 3.9  CL 99 99  CO2 24  --   GLUCOSE 141* 135*  BUN 14 16  CREATININE 0.67 0.60  CALCIUM 9.0  --     GFR: CrCl cannot be calculated (Unknown ideal weight.).  Liver Function Tests: Recent Labs  Lab 11/21/20 2129  AST 34  ALT 30  ALKPHOS 81  BILITOT 1.1  PROT 6.4*  ALBUMIN 3.9    Urine analysis:    Component Value Date/Time   COLORURINE YELLOW 09/04/2016 1348   APPEARANCEUR CLOUDY (A) 09/04/2016 1348   LABSPEC 1.028 09/04/2016 1348   PHURINE 6.0 09/04/2016 1348   GLUCOSEU NEGATIVE 09/04/2016 1348   HGBUR SMALL (A) 09/04/2016 1348   BILIRUBINUR NEGATIVE 09/04/2016 1348   KETONESUR NEGATIVE 09/04/2016 1348   PROTEINUR NEGATIVE 09/04/2016 1348   UROBILINOGEN 1.0 06/10/2015 1000   NITRITE NEGATIVE 09/04/2016 1348   LEUKOCYTESUR NEGATIVE 09/04/2016 1348    Radiological Exams on Admission: DG Chest Portable 1 View  Result Date: 11/21/2020 CLINICAL DATA:  Altered level of consciousness, seizures EXAM: PORTABLE CHEST 1 VIEW COMPARISON:   03/04/2020 FINDINGS: Single frontal view of the chest demonstrates an unremarkable cardiac silhouette. Chronic interstitial changes without airspace disease, effusion, or pneumothorax. No acute bony abnormalities. IMPRESSION: 1. No acute intrathoracic process.  Stable exam. Electronically Signed   By: Sharlet Salina M.D.   On: 11/21/2020 22:10   CT HEAD CODE STROKE WO CONTRAST  Result Date: 11/21/2020 CLINICAL DATA:  Code stroke. Initial evaluation for acute right-sided weakness. EXAM: CT HEAD WITHOUT CONTRAST TECHNIQUE: Contiguous axial images were obtained from the base of the skull through the vertex without intravenous contrast. COMPARISON:  Prior study from 03/04/2020. FINDINGS: Brain: Generalized age-related cerebral atrophy with chronic small vessel ischemic disease. Chronic left MCA territory infarcts involving the left basal ganglia and adjacent left frontotemporal region, corresponding with prior ischemic changes seen on prior brain MRI. Remote lacunar infarct noted at the left thalamus. No acute intracranial hemorrhage. No acute large vessel territory infarct. No mass lesion, midline shift or mass effect. Mild ventricular prominence related to global parenchymal volume loss without hydrocephalus. No extra-axial fluid collection. Vascular: No hyperdense vessel. Scattered vascular calcifications noted within the carotid siphons. Skull: Scalp soft tissues and calvarium within normal limits. Sinuses/Orbits: Globes orbital soft tissues are normal. Paranasal sinuses and mastoid air cells are clear. Other: None. ASPECTS [facility] Stroke Program Early CT Score) - Ganglionic level infarction (caudate, lentiform nuclei, internal capsule, insula, M1-M3 cortex): 7 - Supraganglionic infarction (M4-M6 cortex): 3 Total score (0-10 with 10 being normal): 10 IMPRESSION: 1. No acute intracranial infarct or other abnormality. 2. ASPECTS is 10. 3. Chronic left MCA territory infarcts, with additional remote lacunar infarct  at the left thalamus. 4. Underlying atrophy with chronic small vessel ischemic disease. These results were communicated to Dr. Iver Nestle at 9:42 pmon 12/5/2021by text page via the [facility] messaging system. Electronically Signed   By: Rise Mu M.D.   On: 11/21/2020 21:44    EKG: Independently reviewed.  Sinus rhythm, 60 bpm.  Similar to previous.  Assessment/Plan Principal Problem:   Acute focal neurologic deficit with partial resolution Active Problems:   Aphasia   Hyperlipidemia   History of CVA (cerebrovascular accident)   Hypertension  History of CVA Seizures versus TIA/CVA Acute focal neurologic deficit partial resolution > Patient with worsening  of aphasia and some right-sided weakness similar to previous stroke along with an episode of fecal incontinence as per HPI > Neurology has seen patient in ED and suspect this is more likely seizures from her previous stroke as opposed to recurrent ischemia > We will check for possible seizure triggers with urine studies and lab work > Will initiate stroke work-up if MRI does end up showing a new stroke. - Appreciate neurology's assistance with this patient - MR brain without contrast - EEG - Seizure precautions - Keppra load followed by 500 mg twice daily - Continue home atorvastatin and aspirin - Add on magnesium and phosphorus - Urinalysis and U tox  Leukocytosis - Checking UA as above  Hyperlipidemia -Continue home atorvastatin  Hypertension - Holding home lisinopril pending results of MRI  DVT prophylaxis: Lovenox  Code Status:   Partial, no CPR, intubation okay. Family Communication:  Discussed with daughter at bedside  Disposition Plan:   Patient is from:  Independent living facility  Anticipated DC to:  Independent living facility  Anticipated DC date:  Pending clinical course  Anticipated DC barriers: None  Consults called:  Neurology, Dr. Iver Nestle, consulted in ED  Admission status:  Observation,  telemetry  Severity of Illness: The appropriate patient status for this patient is OBSERVATION. Observation status is judged to be reasonable and necessary in order to provide the required intensity of service to ensure the patient's safety. The patient's presenting symptoms, physical exam findings, and initial radiographic and laboratory data in the context of their medical condition is felt to place them at decreased risk for further clinical deterioration. Furthermore, it is anticipated that the patient will be medically stable for discharge from the hospital within 2 midnights of admission. The following factors support the patient status of observation.   " The patient's presenting symptoms include aphasia, right-sided weakness. " The physical exam findings include aphasia. " The initial radiographic and laboratory data are stable at this time, MRI pending, EEG pending.   Synetta Fail MD [facility]  How to contact the [facility] Attending or Consulting provider 7A - 7P or covering provider during after hours 7P -7A, for this patient?   1. Check the care team in [facility] and look for a) attending/consulting [facility] provider listed and b) the [facility] team listed 2. Log into [email or web address] and use [facility]'s universal password to access. If you do not have the password, please contact the hospital operator. 3. Locate the [facility] provider you are looking for under [facility] and page to a number that you can be directly reached. 4. If you still have difficulty reaching the provider, please page the [facility] (Director on Call) for the Hospitalists listed on amion for assistance.  11/21/2020, 11:51 PM

## 2020-11-21 NOTE — ED Triage Notes (Signed)
Pt from [facility] by EMS for Code Stroke. EMS reported LKW appropriately 1pm. Family was visiting pt between 1p-3p and noticed pt was not at her baseline. EMS found pt on the commode, covered in diarrhea. Pt was aphasic with decreased sensory to R hand and arm. On arrival Pt remains aphasic, decreased sensory to R side

## 2020-11-22 ENCOUNTER — Other Ambulatory Visit: Payer: Self-pay

## 2020-11-22 ENCOUNTER — Observation Stay (HOSPITAL_COMMUNITY): Payer: PPO

## 2020-11-22 DIAGNOSIS — R569 Unspecified convulsions: Secondary | ICD-10-CM | POA: Diagnosis not present

## 2020-11-22 DIAGNOSIS — I10 Essential (primary) hypertension: Secondary | ICD-10-CM | POA: Diagnosis not present

## 2020-11-22 DIAGNOSIS — R4182 Altered mental status, unspecified: Secondary | ICD-10-CM | POA: Diagnosis not present

## 2020-11-22 DIAGNOSIS — R29818 Other symptoms and signs involving the nervous system: Secondary | ICD-10-CM | POA: Diagnosis not present

## 2020-11-22 LAB — COMPREHENSIVE METABOLIC PANEL
ALT: 26 U/L (ref 0–44)
AST: 31 U/L (ref 15–41)
Albumin: 3.6 g/dL (ref 3.5–5.0)
Alkaline Phosphatase: 74 U/L (ref 38–126)
Anion gap: 9 (ref 5–15)
BUN: 10 mg/dL (ref 8–23)
CO2: 25 mmol/L (ref 22–32)
Calcium: 8.7 mg/dL — ABNORMAL LOW (ref 8.9–10.3)
Chloride: 99 mmol/L (ref 98–111)
Creatinine, Ser: 0.63 mg/dL (ref 0.44–1.00)
GFR, Estimated: >60 mL/min (ref >60)
Glucose, Bld: 93 mg/dL (ref 70–99)
Potassium: 3.8 mmol/L (ref 3.5–5.1)
Sodium: 133 mmol/L — ABNORMAL LOW (ref 135–145)
Total Bilirubin: 0.7 mg/dL (ref 0.3–1.2)
Total Protein: 5.9 g/dL — ABNORMAL LOW (ref 6.5–8.1)

## 2020-11-22 LAB — CBC
HCT: 34.6 % — ABNORMAL LOW (ref 36.0–46.0)
Hemoglobin: 11.4 g/dL — ABNORMAL LOW (ref 12.0–15.0)
MCH: 31.1 pg (ref 26.0–34.0)
MCHC: 32.9 g/dL (ref 30.0–36.0)
MCV: 94.3 fL (ref 80.0–100.0)
Platelets: 222 10*3/uL (ref 150–400)
RBC: 3.67 MIL/uL — ABNORMAL LOW (ref 3.87–5.11)
RDW: 13 % (ref 11.5–15.5)
WBC: 9.2 10*3/uL (ref 4.0–10.5)
nRBC: 0 % (ref 0.0–0.2)

## 2020-11-22 LAB — MAGNESIUM: Magnesium: 1.8 mg/dL (ref 1.7–2.4)

## 2020-11-22 LAB — PHOSPHORUS: Phosphorus: 3.7 mg/dL (ref 2.5–4.6)

## 2020-11-22 MED ORDER — LEVETIRACETAM 500 MG PO TABS
500.0000 mg | ORAL_TABLET | Freq: Two times a day (BID) | ORAL | 0 refills | Status: DC
Start: 2020-11-22 — End: 2020-12-28

## 2020-11-22 NOTE — ED Notes (Signed)
Lunch Tray Ordered @ 1039. 

## 2020-11-22 NOTE — Discharge Summary (Signed)
Physician Discharge Summary  Heather Nielsen KZS:[id number] DOB: [birth year] DOA: 11/21/2020  PCP: Heather Nielsen  Admit date: 11/21/2020 Discharge date: 11/22/2020  Admitted From: Home Disposition: Home  Recommendations for Outpatient Follow-up:  1. Follow up with PCP in 1-2 weeks 2. Please obtain BMP/CBC in one week  Home Health: None Equipment/Devices: None  Discharge Condition: Stable CODE STATUS: DNR Diet recommendation: As tolerated  Brief/Interim Summary: Heather Nielsen a 86 y.o.femalewith medical history significant ofCVA, HLD, HTN,OApresenting with speech deficits and right hand weakness. Patient initially presented as a code stroke due to aphasia andright hand weakness. This was canceled after neurology evaluation. Patient lives at independent living and as above had a prior stroke with right-sided weakness andaphasiaas her deficits. Her aphasia had improved but never returned to baseline and she has chronicwaxing and waningaphasia now per daughter. She had no clear last seen normal per report, but upon interview of patient and her family it sounds like she was without deficits at 1 PM when the daughter went to visit her,but she was more emotional than normal at that time. Per patient report it was later in the evening sometime between 6 and 8 PM when she began to have worsening speech difficulties and right-sided weakness she was also noted to have an episode of stool incontinence at that time. As above this aphasia and right-sided weakness are similar to her deficits from her previous stroke. She denies fever, cough, chest pain, shortness of breath, abdominal pain. Vital signs in the ED significant for blood pressure in the 140 systolic. Lab work showed BMP with glucose 141, LFTs with protein of 6.4, CBC with WBC 12.3 (89% neutrophils). PT PTT and INR within normal limits. Respiratory panel for flu and Covid pending, urinalysis pending. Chest x-ray  was without acute abnormality, CT head was without acute abnormality. Patient was seen by neurology in the ED and code stroke was canceled patient is suspected to have seizures related to her prior stroke instead of new stroke.  Patient admitted as above with acute mental status changes concerning for new onset seizure likely provoked in the setting of previous strokes.  Patient has been initiated on Keppra 500 twice daily with close follow-up with neurology.  Patient otherwise completely asymptomatic at this point and stable and agreeable for discharge, daughter at bedside agrees.  Discharge Diagnoses:  Principal Problem:   Acute focal neurologic deficit with partial resolution Active Problems:   Aphasia   Hyperlipidemia   History of CVA (cerebrovascular accident)   Hypertension    Discharge Instructions   Allergies as of 11/22/2020      Reactions   Penicillins Other (See Comments)   Has patient had a PCN reaction causing immediate rash, facial/tongue/throat swelling, SOB or lightheadedness with hypotension:No Has patient had a PCN reaction causing severe rash involving mucus membranes or skin necrosis:No Has patient had a PCN reaction that required hospitalization:No Has patient had a PCN reaction occurring within the last 10 years:No If all of the above answers are "NO", then may proceed with Cephalosporin use. Urinary issues--pt.says "felt like razor blades in her bladder"   Pneumococcal Vaccines Hives, Itching, Rash      Medication List    STOP taking these medications   aspirin 325 MG tablet     TAKE these medications   atorvastatin 80 MG tablet Commonly known as: LIPITOR Take 1 tablet (80 mg total) by mouth daily. What changed: when to take this   clopidogrel 75 MG tablet Commonly  known as: PLAVIX Take 75 mg by mouth daily.   levETIRAcetam 500 MG tablet Commonly known as: KEPPRA Take 1 tablet (500 mg total) by mouth 2 (two) times daily.   lisinopril 10 MG  tablet Commonly known as: ZESTRIL TAKE ONE TABLET BY MOUTH DAILY       Allergies  Allergen Reactions  . Penicillins Other (See Comments)    Has patient had a PCN reaction causing immediate rash, facial/tongue/throat swelling, SOB or lightheadedness with hypotension:No Has patient had a PCN reaction causing severe rash involving mucus membranes or skin necrosis:No Has patient had a PCN reaction that required hospitalization:No Has patient had a PCN reaction occurring within the last 10 years:No If all of the above answers are "NO", then may proceed with Cephalosporin use. Urinary issues--pt.says "felt like razor blades in her bladder"  . Pneumococcal Vaccines Hives, Itching and Rash    Consultations:  Neurology   Procedures/Studies: EEG  Result Date: 11/22/2020 Heather Quest, Nielsen     11/22/2020 11:20 AM Patient Name: Heather Nielsen MRN: [id number] Epilepsy Attending: Charlsie Nielsen Referring Physician/Provider: Dr Heather Nielsen Date: 11/22/2020 Duration: 23.38 mins Patient history: 86 year old woman with a prior left MCA stroke in March (cryptogenic), presenting with worsening left MCA symptoms. EEG to evaluate for seizure. Level of alertness: Awake, drowsy, sleep, comatose, lethargic AEDs during EEG study: LEV Technical aspects: This EEG study was done with scalp electrodes positioned according to the 10-20 International system of electrode placement. Electrical activity was acquired at a sampling rate of 500Hz  and reviewed with a high frequency filter of 70Hz  and a low frequency filter of 1Hz . EEG data were recorded continuously and digitally stored. Description: The posterior dominant rhythm consists of 8 Hz activity of moderate voltage (25-35 uV) seen predominantly in posterior head regions, asymmetric ( L<R) and reactive to eye opening and eye closing. EEG showed continuous 3 to 6 Hz theta-delta slowing in left hemisphere, maximal left temporal region.  Hyperventilation and photic  stimulation were not performed.   ABNORMALITY -Continuous slow, lateralized left hemisphere, maximal left temporal region - Background asymmetry, left<right IMPRESSION: This study is suggestive of cortical dysfunction in left hemisphere, maximal left temporal region consistent with underlying infarct. No seizures or epileptiform discharges were seen throughout the recording.   MR BRAIN WO CONTRAST  Result Date: 11/22/2020 CLINICAL DATA:  Initial evaluation for acute seizure. EXAM: MRI HEAD WITHOUT CONTRAST TECHNIQUE: Multiplanar, multiecho pulse sequences of the brain and surrounding structures were obtained without intravenous contrast. COMPARISON:  Prior CT from 11/21/2020. FINDINGS: Brain: Diffuse prominence of the CSF containing spaces compatible generalized age-related cerebral atrophy. Remote lacunar type infarct noted involving the left basal ganglia/corona radiata. Associated wallerian degeneration extends into the left cerebral peduncle, left midbrain, with inferior extension into the left ventrolateral brainstem. Additional area of encephalomalacia and gliosis at the peripheral left temporal lobe also likely reflects remote ischemic change. Small remote lacunar infarcts at the left thalamus and left cerebellum. No abnormal foci of restricted diffusion to suggest acute or subacute ischemia or changes related to acute seizure. Gray-white matter differentiation otherwise maintained. No other areas of remote cortical infarction. Mild chronic hemosiderin staining noted about the chronic left basal ganglia lacunar infarct. No other evidence for acute or chronic intracranial hemorrhage. 8 mm meningioma overlies the left frontotemporal convexity without associated mass effect (series 18, image 16). No other mass lesion, mass effect, or midline shift. Diffuse ventricular prominence related to global parenchymal volume loss without hydrocephalus. No  extra-axial fluid collection. Pituitary gland  suprasellar region within normal limits. Hippocampi symmetric and within normal limits bilaterally. Vascular: Attenuated flow voids within the left MCA branches as compared to the right, similar to prior CTA. Major intracranial vascular flow voids are otherwise maintained. Skull and upper cervical spine: Degenerative thickening noted at the tectorial membrane without significant stenosis. Craniocervical junction otherwise unremarkable. Bone marrow signal intensity within normal limits. No scalp soft tissue abnormality. Sinuses/Orbits: Globes and orbital soft tissues within normal limits. Paranasal sinuses are clear. No mastoid effusion. Inner ear structures grossly normal. Other: None. IMPRESSION: 1. No acute intracranial abnormality. 2. Remote lacunar infarcts involving the left basal ganglia/corona radiata, left thalamus, and left cerebellum, with additional remote left temporal lobe infarct. 3. Attenuated flow voids within the left MCA branches as compared to the right, consistent with previously identified left M1 occlusion. Finding is grossly stable from prior CTA. 4. 8 mm meningioma overlying the left frontotemporal convexity without associated mass effect. 5. Moderately advanced cerebral atrophy with chronic small vessel ischemic disease. Electronically Signed   By: Rise MuBenjamin  McClintock M.D.   On: 11/22/2020 01:23   DG Chest Portable 1 View  Result Date: 11/21/2020 CLINICAL DATA:  Altered level of consciousness, seizures EXAM: PORTABLE CHEST 1 VIEW COMPARISON:  03/04/2020 FINDINGS: Single frontal view of the chest demonstrates an unremarkable cardiac silhouette. Chronic interstitial changes without airspace disease, effusion, or pneumothorax. No acute bony abnormalities. IMPRESSION: 1. No acute intrathoracic process.  Stable exam. Electronically Signed   By: Sharlet SalinaMichael  Brown M.D.   On: 11/21/2020 22:10   CUP PACEART REMOTE DEVICE CHECK  Result Date: 10/24/2020 ILR summary report received. Battery status  OK. Normal device function. No new symptom, tachy, brady, or pause episodes. No new AF episodes. Monthly summary reports and ROV/PRN  CT HEAD CODE STROKE WO CONTRAST  Result Date: 11/21/2020 CLINICAL DATA:  Code stroke. Initial evaluation for acute right-sided weakness. EXAM: CT HEAD WITHOUT CONTRAST TECHNIQUE: Contiguous axial images were obtained from the base of the skull through the vertex without intravenous contrast. COMPARISON:  Prior study from 03/04/2020. FINDINGS: Brain: Generalized age-related cerebral atrophy with chronic small vessel ischemic disease. Chronic left MCA territory infarcts involving the left basal ganglia and adjacent left frontotemporal region, corresponding with prior ischemic changes seen on prior brain MRI. Remote lacunar infarct noted at the left thalamus. No acute intracranial hemorrhage. No acute large vessel territory infarct. No mass lesion, midline shift or mass effect. Mild ventricular prominence related to global parenchymal volume loss without hydrocephalus. No extra-axial fluid collection. Vascular: No hyperdense vessel. Scattered vascular calcifications noted within the carotid siphons. Skull: Scalp soft tissues and calvarium within normal limits. Sinuses/Orbits: Globes orbital soft tissues are normal. Paranasal sinuses and mastoid air cells are clear. Other: None. ASPECTS [facility]([place] Stroke Program Early CT Score) - Ganglionic level infarction (caudate, lentiform nuclei, internal capsule, insula, M1-M3 cortex): 7 - Supraganglionic infarction (M4-M6 cortex): 3 Total score (0-10 with 10 being normal): 10 IMPRESSION: 1. No acute intracranial infarct or other abnormality. 2. ASPECTS is 10. 3. Chronic left MCA territory infarcts, with additional remote lacunar infarct at the left thalamus. 4. Underlying atrophy with chronic small vessel ischemic disease. These results were communicated to Dr. Iver NestleBhagat at 9:42 pmon 12/5/2021by text page via the [facility] messaging system.  Electronically Signed   By: Rise MuBenjamin  McClintock M.D.   On: 11/21/2020 21:44     Subjective: No acute issues or events overnight   Discharge Exam: Vitals:   11/22/20 0800 11/22/20 1000  BP: (!) 128/101 (!) 158/69  Pulse: 66 (!) 59  Resp: (!) 21 15  Temp:    SpO2: 100% 99%   Vitals:   11/22/20 0545 11/22/20 0733 11/22/20 0800 11/22/20 1000  BP: (!) 143/63 (!) 150/62 (!) 128/101 (!) 158/69  Pulse: 65 (!) 54 66 (!) 59  Resp: 18 12 (!) 21 15  Temp:      TempSrc:      SpO2: 99% 97% 100% 99%    General: Pt is alert, awake, not in acute distress Cardiovascular: RRR, S1/S2 +, no rubs, no gallops Respiratory: CTA bilaterally, no wheezing, no rhonchi Abdominal: Soft, NT, ND, bowel sounds + Extremities: no edema, no cyanosis    The results of significant diagnostics from this hospitalization (including imaging, microbiology, ancillary and laboratory) are listed below for reference.     Microbiology: Recent Results (from the past 240 hour(s))  Resp Panel by RT-PCR (Flu A&B, Covid) Nasopharyngeal Swab     Status: None   Collection Time: 11/21/20  9:50 PM   Specimen: Nasopharyngeal Swab; Nasopharyngeal(NP) swabs in vial transport medium  Result Value Ref Range Status   SARS Coronavirus 2 by RT PCR NEGATIVE NEGATIVE Final    Comment: (NOTE) SARS-CoV-2 target nucleic acids are NOT DETECTED.  The SARS-CoV-2 RNA is generally detectable in upper respiratory specimens during the acute phase of infection. The lowest concentration of SARS-CoV-2 viral copies this assay can detect is 138 copies/mL. A negative result does not preclude SARS-Cov-2 infection and should not be used as the sole basis for treatment or other patient management decisions. A negative result may occur with  improper specimen collection/handling, submission of specimen other than nasopharyngeal swab, presence of viral mutation(s) within the areas targeted by this assay, and inadequate number of viral copies(<138  copies/mL). A negative result must be combined with clinical observations, patient history, and epidemiological information. The expected result is Negative.  Fact Sheet for Patients:  BloggerCourse.com  Fact Sheet for Healthcare Providers:  SeriousBroker.it  This test is no t yet approved or cleared by the [place] FDA and  has been authorized for detection and/or diagnosis of SARS-CoV-2 by FDA under an Emergency Use Authorization (EUA). This EUA will remain  in effect (meaning this test can be used) for the duration of the COVID-19 declaration under Section 564(b)(1) of the Act, 21 U.S.C.section 360bbb-3(b)(1), unless the authorization is terminated  or revoked sooner.       Influenza A by PCR NEGATIVE NEGATIVE Final   Influenza B by PCR NEGATIVE NEGATIVE Final    Comment: (NOTE) The Xpert Xpress SARS-CoV-2/FLU/RSV plus assay is intended as an aid in the diagnosis of influenza from Nasopharyngeal swab specimens and should not be used as a sole basis for treatment. Nasal washings and aspirates are unacceptable for Xpert Xpress SARS-CoV-2/FLU/RSV testing.  Fact Sheet for Patients: BloggerCourse.com  Fact Sheet for Healthcare Providers: SeriousBroker.it  This test is not yet approved or cleared by the [place] FDA and has been authorized for detection and/or diagnosis of SARS-CoV-2 by FDA under an Emergency Use Authorization (EUA). This EUA will remain in effect (meaning this test can be used) for the duration of the COVID-19 declaration under Section 564(b)(1) of the Act, 21 U.S.C. section 360bbb-3(b)(1), unless the authorization is terminated or revoked.  Performed at [facility], [address], [place], [place] [address]      Labs: BNP (last 3 results) No results for input(s): BNP in the last 8760 hours. Basic Metabolic Panel:  Recent Labs  Lab  11/21/20 2129 11/21/20 2143 11/22/20 0726  NA 134* 135 133*  K 3.9 3.9 3.8  CL 99 99 99  CO2 24  --  25  GLUCOSE 141* 135* 93  BUN 14 16 10   CREATININE 0.67 0.60 0.63  CALCIUM 9.0  --  8.7*  MG  --   --  1.8  PHOS  --   --  3.7   Liver Function Tests: Recent Labs  Lab 11/21/20 2129 11/22/20 0726  AST 34 31  ALT 30 26  ALKPHOS 81 74  BILITOT 1.1 0.7  PROT 6.4* 5.9*  ALBUMIN 3.9 3.6   No results for input(s): LIPASE, AMYLASE in the last 168 hours. No results for input(s): AMMONIA in the last 168 hours. CBC: Recent Labs  Lab 11/21/20 2129 11/21/20 2143 11/22/20 0726  WBC 12.3*  --  9.2  NEUTROABS 10.7*  --   --   HGB 12.0 12.9 11.4*  HCT 36.4 38.0 34.6*  MCV 93.1  --  94.3  PLT 226  --  222   Cardiac Enzymes: No results for input(s): CKTOTAL, CKMB, CKMBINDEX, TROPONINI in the last 168 hours. BNP: Invalid input(s): POCBNP CBG: Recent Labs  Lab 11/21/20 2133  GLUCAP 125*   D-Dimer No results for input(s): DDIMER in the last 72 hours. Hgb A1c No results for input(s): HGBA1C in the last 72 hours. Lipid Profile No results for input(s): CHOL, HDL, LDLCALC, TRIG, CHOLHDL, LDLDIRECT in the last 72 hours. Thyroid function studies No results for input(s): TSH, T4TOTAL, T3FREE, THYROIDAB in the last 72 hours.  Invalid input(s): FREET3 Anemia work up No results for input(s): VITAMINB12, FOLATE, FERRITIN, TIBC, IRON, RETICCTPCT in the last 72 hours. Urinalysis    Component Value Date/Time   COLORURINE YELLOW 09/04/2016 1348   APPEARANCEUR CLOUDY (A) 09/04/2016 1348   LABSPEC 1.028 09/04/2016 1348   PHURINE 6.0 09/04/2016 1348   GLUCOSEU NEGATIVE 09/04/2016 1348   HGBUR SMALL (A) 09/04/2016 1348   BILIRUBINUR NEGATIVE 09/04/2016 1348   KETONESUR NEGATIVE 09/04/2016 1348   PROTEINUR NEGATIVE 09/04/2016 1348   UROBILINOGEN 1.0 06/10/2015 1000   NITRITE NEGATIVE 09/04/2016 1348   LEUKOCYTESUR NEGATIVE 09/04/2016 1348   Sepsis Labs Invalid input(s):  PROCALCITONIN,  WBC,  LACTICIDVEN Microbiology Recent Results (from the past 240 hour(s))  Resp Panel by RT-PCR (Flu A&B, Covid) Nasopharyngeal Swab     Status: None   Collection Time: 11/21/20  9:50 PM   Specimen: Nasopharyngeal Swab; Nasopharyngeal(NP) swabs in vial transport medium  Result Value Ref Range Status   SARS Coronavirus 2 by RT PCR NEGATIVE NEGATIVE Final    Comment: (NOTE) SARS-CoV-2 target nucleic acids are NOT DETECTED.  The SARS-CoV-2 RNA is generally detectable in upper respiratory specimens during the acute phase of infection. The lowest concentration of SARS-CoV-2 viral copies this assay can detect is 138 copies/mL. A negative result does not preclude SARS-Cov-2 infection and should not be used as the sole basis for treatment or other patient management decisions. A negative result may occur with  improper specimen collection/handling, submission of specimen other than nasopharyngeal swab, presence of viral mutation(s) within the areas targeted by this assay, and inadequate number of viral copies(<138 copies/mL). A negative result must be combined with clinical observations, patient history, and epidemiological information. The expected result is Negative.  Fact Sheet for Patients:  14/05/21  Fact Sheet for Healthcare Providers:  BloggerCourse.com  This test is no t yet approved or cleared by the SeriousBroker.it and  has been authorized for detection and/or diagnosis of SARS-CoV-2 by FDA under an Emergency Use Authorization (EUA). This EUA will remain  in effect (meaning this test can be used) for the duration of the COVID-19 declaration under Section 564(b)(1) of the Act, 21 U.S.C.section 360bbb-3(b)(1), unless the authorization is terminated  or revoked sooner.       Influenza A by PCR NEGATIVE NEGATIVE Final   Influenza B by PCR NEGATIVE NEGATIVE Final    Comment: (NOTE) The Xpert Xpress  SARS-CoV-2/FLU/RSV plus assay is intended as an aid in the diagnosis of influenza from Nasopharyngeal swab specimens and should not be used as a sole basis for treatment. Nasal washings and aspirates are unacceptable for Xpert Xpress SARS-CoV-2/FLU/RSV testing.  Fact Sheet for Patients: BloggerCourse.com  Fact Sheet for Healthcare Providers: SeriousBroker.it  This test is not yet approved or cleared by the [place] FDA and has been authorized for detection and/or diagnosis of SARS-CoV-2 by FDA under an Emergency Use Authorization (EUA). This EUA will remain in effect (meaning this test can be used) for the duration of the COVID-19 declaration under Section 564(b)(1) of the Act, 21 U.S.C. section 360bbb-3(b)(1), unless the authorization is terminated or revoked.  Performed at [facility], [address] [place], [place] [address]      Time coordinating discharge: Over 30 minutes  SIGNED:   Azucena Fallen, DO [facility] 11/22/2020, 12:42 PM Pager   If 7PM-7AM, please contact night-coverage [email or web address]

## 2020-11-22 NOTE — Procedures (Addendum)
Patient Name: Heather Nielsen  MRN: [id number]  Epilepsy Attending: Charlsie Quest  Referring Physician/Provider: Dr Beola Cord Date: 11/22/2020 Duration: 23.38 mins  Patient history: 86 year old woman with a prior left MCA stroke in March (cryptogenic), presenting with worsening left MCA symptoms. EEG to evaluate for seizure.  Level of alertness: Awake, drowsy, sleep, comatose, lethargic  AEDs during EEG study: LEV  Technical aspects: This EEG study was done with scalp electrodes positioned according to the 10-20 International system of electrode placement. Electrical activity was acquired at a sampling rate of 500Hz  and reviewed with a high frequency filter of 70Hz  and a low frequency filter of 1Hz . EEG data were recorded continuously and digitally stored.   Description: The posterior dominant rhythm consists of 8 Hz activity of moderate voltage (25-35 uV) seen predominantly in posterior head regions, asymmetric ( L<R) and reactive to eye opening and eye closing. EEG showed continuous 3 to 6 Hz theta-delta slowing in left hemisphere, maximal left temporal region.  Hyperventilation and photic stimulation were not performed.     ABNORMALITY -Continuous slow, lateralized left hemisphere, maximal left temporal region - Background asymmetry, left<right  IMPRESSION: This study is suggestive of cortical dysfunction in left hemisphere, maximal left temporal region consistent with underlying infarct. No seizures or epileptiform discharges were seen throughout the recording.  Heather Nielsen 

## 2020-11-22 NOTE — Progress Notes (Signed)
EEG completed, results pending. 

## 2020-11-22 NOTE — Code Documentation (Addendum)
Stroke Response Nurse Documentation Code Documentation  Heather Nielsen is a 86 y.o. female arriving to [facility] ED via [facility] on 12/5 with past medical hx of Left MCA CVA, HTN. Code stroke was activated by EMS. Patient from [facility] where she was LKW at unknown and now complaining of increased right sided weakness and more aphasia . On aspirin 325 mg daily. Stroke team at the bedside on patient arrival. Labs drawn and patient cleared for CT by Dr. Particia Nearing. Patient to CT with team. NIHSS 7, see documentation for details and code stroke times. Patient with disoriented, not following commands, right decreased sensation, Global aphasia  and dysarthria  on exam. The following imaging was completed:  CT/MRI. Patient is not a candidate for tPA due to Out of Window. Bedside handoff with ED RN [place].    Rose Fillers  Rapid Response RN

## 2020-11-25 ENCOUNTER — Encounter: Payer: Self-pay | Admitting: Internal Medicine

## 2020-11-27 LAB — CUP PACEART REMOTE DEVICE CHECK
Date Time Interrogation Session: 20211210003133
Implantable Pulse Generator Implant Date: 20210319

## 2020-11-29 ENCOUNTER — Ambulatory Visit (INDEPENDENT_AMBULATORY_CARE_PROVIDER_SITE_OTHER): Payer: PPO

## 2020-11-29 DIAGNOSIS — I63412 Cerebral infarction due to embolism of left middle cerebral artery: Secondary | ICD-10-CM

## 2020-12-09 NOTE — Progress Notes (Signed)
[facility] Summary Report / Loop Recorder 

## 2020-12-19 ENCOUNTER — Other Ambulatory Visit: Payer: Self-pay | Admitting: Internal Medicine

## 2020-12-20 NOTE — Telephone Encounter (Signed)
Pts daughter is calling in needing a refill on Rx's atorvastatin (LIPITOR) 80 MG, clopidogrel (PLAVIX) 75 MG, levetiracetam (KEPPRA) 500 MG and lisinopril (ZESTRIL) 10 MG   Pharm:  [facility] [address]

## 2020-12-28 ENCOUNTER — Ambulatory Visit (INDEPENDENT_AMBULATORY_CARE_PROVIDER_SITE_OTHER): Payer: PPO | Admitting: Internal Medicine

## 2020-12-28 ENCOUNTER — Encounter: Payer: Self-pay | Admitting: Internal Medicine

## 2020-12-28 ENCOUNTER — Other Ambulatory Visit: Payer: Self-pay

## 2020-12-28 VITALS — BP 160/70 | HR 91 | Temp 98.1°F | Wt 101.4 lb

## 2020-12-28 DIAGNOSIS — Z09 Encounter for follow-up examination after completed treatment for conditions other than malignant neoplasm: Secondary | ICD-10-CM

## 2020-12-28 DIAGNOSIS — E785 Hyperlipidemia, unspecified: Secondary | ICD-10-CM | POA: Diagnosis not present

## 2020-12-28 DIAGNOSIS — I10 Essential (primary) hypertension: Secondary | ICD-10-CM

## 2020-12-28 DIAGNOSIS — Z8673 Personal history of transient ischemic attack (TIA), and cerebral infarction without residual deficits: Secondary | ICD-10-CM

## 2020-12-28 MED ORDER — LEVETIRACETAM 500 MG PO TABS: 500.0000 mg | ORAL_TABLET | Freq: Two times a day (BID) | ORAL | 0 refills | Status: DC

## 2020-12-28 NOTE — Patient Instructions (Signed)
-  Nice seeing you today!!  -Keep me apprised of blood pressure measurements and medication compliance.  -Schedule follow up in 3 months.

## 2020-12-28 NOTE — Progress Notes (Signed)
Established Patient Office Visit     This visit occurred during the SARS-CoV-2 public health emergency.  Safety protocols were in place, including screening questions prior to the visit, additional usage of staff PPE, and extensive cleaning of exam room while observing appropriate contact time as indicated for disinfecting solutions.    CC/Reason for Visit: Hospital follow-up, follow-up chronic medical conditions  HPI: Heather Nielsen is a 87 y.o. female who is coming in today for the above mentioned reasons. Past Medical History is significant for: Hyperlipidemia, status post left MCA CVA in March 2021 with significant expressive aphasia.  She also has a history of hypertension.  Blood pressure has been uncontrolled recently.  Daughter states she has missed multiple doses of medications from her pillbox.  Patient is adamant that she does not want assistance with medication.  She was hospitalized overnight in early December due to worsening right sided weakness.  Stroke work-up was negative.  Since then she has been on Keppra for possible seizure.  She has her first follow-up with neurology scheduled for tomorrow.  Patient would like to come off the Keppra if possible.   Past Medical/Surgical History: Past Medical History:  Diagnosis Date  . Arthritis   . Complication of anesthesia   . CVA (cerebral vascular accident) (HCC)   . Hypertension   . PONV (postoperative nausea and vomiting)   . Stroke (HCC)   . Urinary incontinence     Past Surgical History:  Procedure Laterality Date  . KNEE SURGERY  2007   menicus   . LOOP RECORDER INSERTION N/A 03/05/2020   Procedure: LOOP RECORDER INSERTION;  Surgeon: Hillis Range, MD;  Location: [facility];  Service: Cardiovascular;  Laterality: N/A;  . ROTATOR CUFF REPAIR Right 2006  . TOTAL HIP ARTHROPLASTY Right 06/16/2015   Procedure: RIGHT TOTAL HIP ARTHROPLASTY ANTERIOR APPROACH;  Surgeon: Ollen Gross, MD;  Location: [facility];   Service: Orthopedics;  Laterality: Right;  . TOTAL HIP ARTHROPLASTY Left 09/06/2016   Procedure: LEFT TOTAL HIP ARTHROPLASTY ANTERIOR APPROACH;  Surgeon: Ollen Gross, MD;  Location: [facility];  Service: Orthopedics;  Laterality: Left;    Social History:  reports that she quit smoking about 36 years ago. Her smoking use included cigarettes. She has a 6.25 pack-year smoking history. She has never used smokeless tobacco. She reports current alcohol use. She reports that she does not use drugs.  Allergies: Allergies  Allergen Reactions  . Penicillins Other (See Comments)    Has patient had a PCN reaction causing immediate rash, facial/tongue/throat swelling, SOB or lightheadedness with hypotension:No Has patient had a PCN reaction causing severe rash involving mucus membranes or skin necrosis:No Has patient had a PCN reaction that required hospitalization:No Has patient had a PCN reaction occurring within the last 10 years:No If all of the above answers are "NO", then may proceed with Cephalosporin use. Urinary issues--pt.says "felt like razor blades in her bladder"  . Pneumococcal Vaccines Hives, Itching and Rash    Family History:  Family History  Problem Relation Age of Onset  . Heart disease Mother        died age 31  . CAD Brother 33     Current Outpatient Medications:  .  atorvastatin (LIPITOR) 80 MG tablet, TAKE ONE TABLET BY MOUTH DAILY, Disp: 90 tablet, Rfl: 1 .  clopidogrel (PLAVIX) 75 MG tablet, TAKE ONE TABLET BY MOUTH DAILY, Disp: 90 tablet, Rfl: 1 .  lisinopril (ZESTRIL) 10 MG tablet, TAKE ONE TABLET  BY MOUTH DAILY (Patient taking differently: Take 10 mg by mouth daily.), Disp: 90 tablet, Rfl: 1 .  levETIRAcetam (KEPPRA) 500 MG tablet, Take 1 tablet (500 mg total) by mouth 2 (two) times daily., Disp: 6 tablet, Rfl: 0  Review of Systems:  Constitutional: Denies fever, chills, diaphoresis, appetite change and fatigue.  HEENT: Denies photophobia, eye pain, redness, hearing  loss, ear pain, congestion, sore throat, rhinorrhea, sneezing, mouth sores, trouble swallowing, neck pain, neck stiffness and tinnitus.   Respiratory: Denies SOB, DOE, cough, chest tightness,  and wheezing.   Cardiovascular: Denies chest pain, palpitations and leg swelling.  Gastrointestinal: Denies nausea, vomiting, abdominal pain, diarrhea, constipation, blood in stool and abdominal distention.  Genitourinary: Denies dysuria, urgency, frequency, hematuria, flank pain and difficulty urinating.  Endocrine: Denies: hot or cold intolerance, sweats, changes in hair or nails, polyuria, polydipsia. Musculoskeletal: Denies myalgias, back pain, joint swelling, arthralgias and gait problem.  Skin: Denies pallor, rash and wound.  Neurological: Denies dizziness, seizures, syncope, weakness, light-headedness, numbness and headaches.  Hematological: Denies adenopathy. Easy bruising, personal or family bleeding history  Psychiatric/Behavioral: Denies suicidal ideation, mood changes, confusion, nervousness, sleep disturbance and agitation    Physical Exam: Vitals:   12/28/20 1352  BP: (!) 160/70  Pulse: 91  Temp: 98.1 F (36.7 C)  TempSrc: Oral  SpO2: 97%  Weight: 101 lb 6.4 oz (46 kg)    Body mass index is 18.55 kg/m.   Constitutional: NAD, calm, comfortable Eyes: PERRL, lids and conjunctivae normal ENMT: Mucous membranes are moist.  Respiratory: clear to auscultation bilaterally, no wheezing, no crackles. Normal respiratory effort. No accessory muscle use.  Cardiovascular: Regular rate and rhythm, no murmurs / rubs / gallops. No extremity edema.  Abdomen: no tenderness, no masses palpated. No hepatosplenomegaly. Bowel sounds positive.  Psychiatric: Normal judgment and insight. Alert and oriented x 3.  Mood is tearful at times, significant expressive aphasia.   Impression and Plan:  Hospital discharge follow-up  History of CVA (cerebrovascular accident) - Plan: levETIRAcetam (KEPPRA)  500 MG tablet  Primary hypertension  Hyperlipidemia, unspecified hyperlipidemia type  [facility] charts have been reviewed.  It was thought that she might have had a stroke but brain imaging was negative.  She was discharged home on Keppra for possibility of seizure.  She would like to come off medication if possible.  Patient has follow-up with neurology tomorrow, I have told her that they would be the ones to make that decision. -Patient has been hypertensive recently and this is confirmed in office visit today.  Daughter states that she has missed a few doses of blood pressure medication from her pillbox.  Despite living at an assisted living facility, patient is adamant that she can do her own medications and declines medication assistance.  We will continue to monitor the situation.  If blood pressure remains elevated and multiple medication doses are missed we may need to institute medication help from her ALF.    Patient Instructions  -Nice seeing you today!!  -Keep me apprised of blood pressure measurements and medication compliance.  -Schedule follow up in 3 months.     Chaya Jan, MD [facility]

## 2020-12-29 ENCOUNTER — Encounter: Payer: Self-pay | Admitting: Internal Medicine

## 2020-12-29 DIAGNOSIS — Z8673 Personal history of transient ischemic attack (TIA), and cerebral infarction without residual deficits: Secondary | ICD-10-CM

## 2020-12-29 DIAGNOSIS — M1712 Unilateral primary osteoarthritis, left knee: Secondary | ICD-10-CM | POA: Diagnosis not present

## 2020-12-29 DIAGNOSIS — I639 Cerebral infarction, unspecified: Secondary | ICD-10-CM

## 2020-12-29 DIAGNOSIS — M170 Bilateral primary osteoarthritis of knee: Secondary | ICD-10-CM | POA: Diagnosis not present

## 2020-12-30 MED ORDER — LEVETIRACETAM 500 MG PO TABS: 500.0000 mg | ORAL_TABLET | Freq: Two times a day (BID) | ORAL | 2 refills | Status: DC

## 2021-01-03 ENCOUNTER — Ambulatory Visit (INDEPENDENT_AMBULATORY_CARE_PROVIDER_SITE_OTHER): Payer: PPO

## 2021-01-03 DIAGNOSIS — I639 Cerebral infarction, unspecified: Secondary | ICD-10-CM | POA: Diagnosis not present

## 2021-01-03 LAB — CUP PACEART REMOTE DEVICE CHECK
Date Time Interrogation Session: 20220111230158
Implantable Pulse Generator Implant Date: 20210319

## 2021-01-15 NOTE — Progress Notes (Signed)
[facility] Summary Report / Loop Recorder 

## 2021-02-02 LAB — CUP PACEART REMOTE DEVICE CHECK
Date Time Interrogation Session: 20220213230414
Implantable Pulse Generator Implant Date: 20210319

## 2021-02-07 ENCOUNTER — Ambulatory Visit (INDEPENDENT_AMBULATORY_CARE_PROVIDER_SITE_OTHER): Payer: PPO

## 2021-02-07 DIAGNOSIS — Z20828 Contact with and (suspected) exposure to other viral communicable diseases: Secondary | ICD-10-CM | POA: Diagnosis not present

## 2021-02-07 DIAGNOSIS — I639 Cerebral infarction, unspecified: Secondary | ICD-10-CM

## 2021-02-07 DIAGNOSIS — Z1159 Encounter for screening for other viral diseases: Secondary | ICD-10-CM | POA: Diagnosis not present

## 2021-02-08 DIAGNOSIS — M7122 Synovial cyst of popliteal space [Baker], left knee: Secondary | ICD-10-CM | POA: Diagnosis not present

## 2021-02-10 NOTE — Progress Notes (Signed)
[facility] Summary Report / Loop Recorder 

## 2021-02-18 DIAGNOSIS — M25562 Pain in left knee: Secondary | ICD-10-CM | POA: Diagnosis not present

## 2021-02-18 DIAGNOSIS — M1712 Unilateral primary osteoarthritis, left knee: Secondary | ICD-10-CM | POA: Diagnosis not present

## 2021-02-21 DIAGNOSIS — Z20828 Contact with and (suspected) exposure to other viral communicable diseases: Secondary | ICD-10-CM | POA: Diagnosis not present

## 2021-02-21 DIAGNOSIS — Z1159 Encounter for screening for other viral diseases: Secondary | ICD-10-CM | POA: Diagnosis not present

## 2021-02-25 ENCOUNTER — Other Ambulatory Visit: Payer: Self-pay | Admitting: Internal Medicine

## 2021-02-25 DIAGNOSIS — M1712 Unilateral primary osteoarthritis, left knee: Secondary | ICD-10-CM | POA: Diagnosis not present

## 2021-02-28 DIAGNOSIS — Z20828 Contact with and (suspected) exposure to other viral communicable diseases: Secondary | ICD-10-CM | POA: Diagnosis not present

## 2021-02-28 DIAGNOSIS — Z1159 Encounter for screening for other viral diseases: Secondary | ICD-10-CM | POA: Diagnosis not present

## 2021-03-04 DIAGNOSIS — M1712 Unilateral primary osteoarthritis, left knee: Secondary | ICD-10-CM | POA: Diagnosis not present

## 2021-03-07 DIAGNOSIS — Z1159 Encounter for screening for other viral diseases: Secondary | ICD-10-CM | POA: Diagnosis not present

## 2021-03-07 DIAGNOSIS — Z20828 Contact with and (suspected) exposure to other viral communicable diseases: Secondary | ICD-10-CM | POA: Diagnosis not present

## 2021-03-12 LAB — CUP PACEART REMOTE DEVICE CHECK
Date Time Interrogation Session: 20220318230511
Implantable Pulse Generator Implant Date: 20210319

## 2021-03-14 ENCOUNTER — Ambulatory Visit (INDEPENDENT_AMBULATORY_CARE_PROVIDER_SITE_OTHER): Payer: PPO

## 2021-03-14 DIAGNOSIS — Z20828 Contact with and (suspected) exposure to other viral communicable diseases: Secondary | ICD-10-CM | POA: Diagnosis not present

## 2021-03-14 DIAGNOSIS — I639 Cerebral infarction, unspecified: Secondary | ICD-10-CM | POA: Diagnosis not present

## 2021-03-14 DIAGNOSIS — Z1159 Encounter for screening for other viral diseases: Secondary | ICD-10-CM | POA: Diagnosis not present

## 2021-03-21 DIAGNOSIS — Z1159 Encounter for screening for other viral diseases: Secondary | ICD-10-CM | POA: Diagnosis not present

## 2021-03-21 DIAGNOSIS — Z20828 Contact with and (suspected) exposure to other viral communicable diseases: Secondary | ICD-10-CM | POA: Diagnosis not present

## 2021-03-22 ENCOUNTER — Encounter: Payer: Self-pay | Admitting: Neurology

## 2021-03-22 ENCOUNTER — Other Ambulatory Visit: Payer: Self-pay

## 2021-03-22 ENCOUNTER — Ambulatory Visit: Payer: PPO | Admitting: Neurology

## 2021-03-22 VITALS — BP 166/77 | HR 90 | Ht 60.6 in | Wt 101.2 lb

## 2021-03-22 DIAGNOSIS — G40009 Localization-related (focal) (partial) idiopathic epilepsy and epileptic syndromes with seizures of localized onset, not intractable, without status epilepticus: Secondary | ICD-10-CM | POA: Diagnosis not present

## 2021-03-22 DIAGNOSIS — I69320 Aphasia following cerebral infarction: Secondary | ICD-10-CM | POA: Diagnosis not present

## 2021-03-22 DIAGNOSIS — Z8673 Personal history of transient ischemic attack (TIA), and cerebral infarction without residual deficits: Secondary | ICD-10-CM

## 2021-03-22 MED ORDER — LEVETIRACETAM 500 MG PO TABS: 500.0000 mg | ORAL_TABLET | Freq: Two times a day (BID) | ORAL | 2 refills | Status: DC

## 2021-03-22 NOTE — Progress Notes (Signed)
[facility] [address] [place]. [place] [address] [phone]       OFFICE CONSULT NOTE  Ms. Heather Nielsen Date of Birth:  1934-01-03 Medical Record Number:  [id number]   Referring MD: Peggye Pitt  Reason for Referral: Seizure HPI: Ms. Heather Nielsen is a 87 year old pleasant Caucasian lady seen for consultation visit today for seizures.  History is obtained from the patient and her daughter as well as review of electronic medical records and personally reviewed imaging films in PACS.  She has past medical history of hypertension, hyperlipidemia, arthritis and cryptogenic left MCA infarct on 03/04/2020 with residual expressive aphasia.  She was last seen by me for follow-up further on 04/06/2020 and still had significant aphasia.  She has been living at independent living at [facility] but follow-up 3 to 4 weeks prior to 11/21/2020 when she was seen at the hospital she was having some increasing episodes of labile affect with easy crying and laughter and refusal daily checks.  Patient then was noted as having sudden onset of speech difficulty and disorientation.  The daughter called 911 and when they arrived they found the patient to have stool incontinence and significant amount of diarrhea in the toilet and she had soiled herself.  There was no reported tongue biting there was no witnessed seizure activity but patient was quite confused and disoriented.  There is no signs of any recent infection.  Patient had somewhat similar episode a few weeks ago when she had soiled herself and was confused and disoriented after that.  Patient had an EEG done which showed continuous 3 to 6 Hz Teater delta range slowing in the left hemisphere maximal in the left temporal region.  MRI scan of the brain showed no acute abnormality but did show remote infarcts in left basal ganglia/corona radiata, left thalamus and left cerebellum with left temporal lobe as well.  There was 8 mm meningioma noted in the  left frontotemporal convexity without mass-effect.  Comprehensive metabolic panel was significant only for slightly low sodium of 133.  CBC magnesium and phosphorus are normal.  UA was not checked.  Covid test was negative.  Patient started on Keppra for presumed seizures and has done well since then.  She is tolerating finder milligram twice daily fairly well without any behavioral side effects no dizziness.  She has had no further episodes of incontinence or confusion disorientation.  She remains on Plavix for stroke prevention and is tolerating well without bruising or bleeding.  Blood pressure has been high in the 150s is usually today it is 166/77.  She is tolerating Lipitor well without muscle aches and pains but not sure when last lipid profile was checked.  There is no prior history of seizures, significant head injury with loss of consciousness.  Patient still fiercely independent and tries to take her own blood pressure and take her own medicines.  She was given a prescription for Keppra but she ran out of it 2 days ago.  ROS:   14 system review of systems is positive for speech difficulties, confusion, disorientation, incontinence of stool, emotional lability all other systems negative  PMH:  Past Medical History:  Diagnosis Date  . Arthritis   . Complication of anesthesia   . CVA (cerebral vascular accident) (HCC)   . Hypertension   . PONV (postoperative nausea and vomiting)   . Stroke (HCC)   . Urinary incontinence     Social History:  Social History   Socioeconomic History  . Marital  status: Widowed    Spouse name: Not on file  . Number of children: 5  . Years of education: Not on file  . Highest education level: Not on file  Occupational History  . Not on file  Tobacco Use  . Smoking status: Former Smoker    Packs/day: 0.25    Years: 25.00    Pack years: 6.25    Types: Cigarettes    Quit date: 12/18/1984    Years since quitting: 36.2  . Smokeless tobacco: Never Used   Substance and Sexual Activity  . Alcohol use: Yes    Alcohol/week: 0.0 standard drinks    Comment: gin and tonic and glass of wine daily   . Drug use: No  . Sexual activity: Not on file  Other Topics Concern  . Not on file  Social History Narrative   Lives alone   Right Handed   Drinks 1-2 cups caffeine daily   Social Determinants of Health   Financial Resource Strain: Not on file  Food Insecurity: Not on file  Transportation Needs: Not on file  Physical Activity: Not on file  Stress: Not on file  Social Connections: Not on file  Intimate Partner Violence: Not on file    Medications:   Current Outpatient Medications on File Prior to Visit  Medication Sig Dispense Refill  . atorvastatin (LIPITOR) 80 MG tablet TAKE ONE TABLET BY MOUTH DAILY 90 tablet 1  . clopidogrel (PLAVIX) 75 MG tablet TAKE ONE TABLET BY MOUTH DAILY 90 tablet 1  . lisinopril (ZESTRIL) 10 MG tablet TAKE ONE TABLET BY MOUTH DAILY 90 tablet 1   No current facility-administered medications on file prior to visit.    Allergies:   Allergies  Allergen Reactions  . Penicillins Other (See Comments)    Has patient had a PCN reaction causing immediate rash, facial/tongue/throat swelling, SOB or lightheadedness with hypotension:No Has patient had a PCN reaction causing severe rash involving mucus membranes or skin necrosis:No Has patient had a PCN reaction that required hospitalization:No Has patient had a PCN reaction occurring within the last 10 years:No If all of the above answers are "NO", then may proceed with Cephalosporin use. Urinary issues--pt.says "felt like razor blades in her bladder"  . Pneumococcal Vaccines Hives, Itching and Rash    Physical Exam General: Frail elderly petite Caucasian lady, seated, in no evident distress Head: head normocephalic and atraumatic.   Neck: supple with no carotid or supraclavicular bruits Cardiovascular: regular rate and rhythm, no murmurs Musculoskeletal: no  deformity mild kyphoscoliosis Skin:  no rash/petichiae Vascular:  Normal pulses all extremities  Neurologic Exam Mental Status: Awake and fully alert. Oriented to place and time. Recent and remote memory diminished. Attention span, concentration and fund of knowledge diminished mood and affect slightly inappropriate with mild pseudobulbar affect..  Expressive aphasia with nonfluent speech with occasional paraphasic errors and word finding difficulties.  Comprehension seems better preserved.  Difficulty with naming but able to repeat quite well. Cranial Nerves: Fundoscopic exam reveals sharp disc margins. Pupils equal, briskly reactive to light. Extraocular movements full without nystagmus. Visual fields full to confrontation. Hearing intact. Facial sensation intact. Face, tongue, palate moves normally and symmetrically.  Motor: Normal bulk and tone. Normal strength in all tested extremity muscles.  Diminished fine finger movements on the right.  Orbits left over right upper extremity. Sensory.: intact to touch , pinprick , position and vibratory sensation.  Coordination: Rapid alternating movements normal in all extremities. Finger-to-nose and heel-to-shin performed accurately bilaterally. Gait  and Station: Arises from chair without difficulty. Stance is normal. Gait demonstrates normal stride length and balance . Able to heel, toe and tandem walk with mild difficulty.  Reflexes: 1+ and symmetric. Toes downgoing.       ASSESSMENT: 56 old patient with 2 episodes of worsening aphasia confusion disorientation and stool incontinence possibly unwitnessed seizures with postictal confusion symptomatic seizures from her remote left MCA infarct.  History of cryptogenic left MCA infarct with residual aphasia in March 2021.  Vascular risk factors of hypertension and hyperlipidemia.  She also has mild pseudobulbar affect but not severe enough to justify medications at the present time.    PLAN: I had a  long discussion with the patient and her daughter regarding her 2 episodes of confusion and disorientation and worsening aphasia in the setting of incontinence likely representing unwitnessed seizures with post ictal confusion which is likely symptomatic seizures from her remote stroke.  Agree with continuing Keppra 500 mg twice daily which she seems to be tolerating well and then gave her refills.  Continue Plavix for stroke prevention with aggressive risk factor modification strict control of hypertension blood pressure goal below 130/90 and advised him to keep a blood pressure log and see primary care physician for medication adjustment.  Strict control of lipids with LDL goal below 70 mg percent and diabetes with hemoglobin A1c goal below 6.5%.  Check lipid profile and hemoglobin A1c today as well as screening carotid ultrasound.  Check EEG for silent seizures.  She was also advised to follow-up with primary care physician for her stool incontinence.  Patient will return for follow-up in the future in 6 months or call earlier if necessary.  Greater than 50% time during this 45-minute visit was spent in counseling and coordination of care about her episode of possible seizures and remote stroke and answering questions. Delia Heady, MD Note: This document was prepared with digital dictation and possible smart phrase technology. Any transcriptional errors that result from this process are unintentional.

## 2021-03-22 NOTE — Patient Instructions (Signed)
I had a long discussion with the patient and her daughter regarding her 2 episodes of confusion and disorientation and worsening aphasia in the setting of incontinence likely representing unwitnessed seizures with post ictal confusion which is likely symptomatic seizures from her remote stroke.  Agree with continuing Keppra 500 mg twice daily which she seems to be tolerating well and then gave her refills.  Continue Plavix for stroke prevention with aggressive risk factor modification strict control of hypertension blood pressure goal below 130/90 and advised him to keep a blood pressure log and see primary care physician for medication adjustment.  Strict control of lipids with LDL goal below 70 mg percent and diabetes with hemoglobin A1c goal below 6.5%.  Check lipid profile and hemoglobin A1c today as well as screening carotid ultrasound.  Check EEG for silent seizures.  She was also advised to follow-up with primary care physician for her stool incontinence.  Patient will return for follow-up in the future in 6 months or call earlier if necessary.  Epilepsy Epilepsy is a condition in which a person has repeated seizures over time. A seizure is a sudden burst of abnormal electrical and chemical activity in the brain. Seizures can cause a change in attention, behavior, or ability to remain awake and alert (altered mental status). Epilepsy increases a person's risk of falls, accidents, and injury. It can also lead to:  Depression.  Poor memory.  Sudden unexplained death in epilepsy (SUDEP). This is rare, and its cause is not known. Most people with epilepsy lead normal lives. What are the causes? This condition may be caused by:  A head injury or injury that happens at birth.  A high fever during childhood.  A stroke.  Bleeding into or around the brain.  Certain medicines and drugs.  Having too little oxygen for a long period of time.  Abnormal brain development.  Certain conditions.  These may include: ? Brain infection. ? Brain tumor. ? Conditions that are passed from parent to child (are hereditary). Many times, the cause of this condition is not known. What are the signs or symptoms? Symptoms of a seizure vary greatly from person to person. They may include:  Uncontrollable shaking (convulsions) with fast, jerking movements of the arms or legs.  Stiffening of the body.  Breathing problems.  Confusion, staring, or unresponsiveness.  Head nodding, eye blinking or fluttering, or rapid eye movements.  Drooling, grunting, or making clicking sounds with your mouth.  Loss of bladder control and bowel control. Some people have symptoms right before a seizure happens (aura) and right after a seizure happens. Symptoms of an aura include:  Fear or anxiety.  Nausea.  Vertigo. This is a feeling like: ? You are moving when you are not. ? Your surroundings are moving when they are not.  Dj vu. This is a feeling of having seen or heard something before.  Odd tastes or smells.  Changes in vision, such as seeing flashing lights or spots. Symptoms that follow a seizure include:  Confusion.  Sleepiness.  Headache.  Sore muscles. How is this diagnosed? This condition is diagnosed based on:  Your symptoms.  Your medical history.  A physical exam.  A neurological exam. This includes checking your strength, reflexes, coordination, and sensations.  Tests. These may include: ? A painless test that records your brain waves (electroencephalogram, orEEG). ? MRI. ? CT scan. ? A test of your spinal fluid (lumbar puncture, or spinal tap). ? Blood tests to check for signs of  infection or abnormal blood chemistry. How is this treated? Treatment can control seizures. Some types of epilepsy will need lifelong treatment, and some types go away in time. This condition may be treated with:  Medicines to control seizures and prevent future seizures.  A vagus  nerve stimulator. This is a device that is implanted in the chest. The device sends electrical impulses to the vagus nerve and to the brain to prevent seizures. This treatment may be recommended if medicines do not help.  Brain surgery. There are several kinds of surgeries that may be done to stop seizures from happening or to reduce how often seizures happen.  Blood tests. You may need to have blood tests regularly to check that you are getting the right amount of medicine.  The ketogenic diet. This diet involves foods that are low in carbohydrates and high in fat. When this condition has been diagnosed, it is important to begin treatment as soon as possible. For some people, epilepsy goes away in time. Follow these instructions at home: Medicines  Take over-the-counter and prescription medicines only as told by your health care provider.  Avoid any substances that may prevent your medicine from working properly, such as alcohol. Activity  Get enough rest. Lack of sleep can make seizures more likely to happen.  Follow instructions from your health care provider about driving, swimming, and doing any other activities that would be dangerous if you had a seizure. If you live in the [place] check with your local department of motor vehicles [facility]) to find out about local driving laws. Each state has specific rules about when you can legally start driving again. Educating others  Teach friends and family what to do if you have a seizure. They should: ? Help you get down to the ground to prevent a fall. ? Cushion your head and body. ? Loosen any tight clothing around your neck. ? Turn you on your side. If vomiting occurs, this helps keep your airway clear. ? Not hold you down. Holding you down will not stop the seizure. ? Not put anything in your mouth. ? Stay with you until you recover. ? Know whether or not you need emergency care.   General instructions  Avoid anything that has ever  triggered a seizure for you.  Keep a seizure diary. Record what you remember about each seizure, especially anything that might have triggered the seizure.  Keep all follow-up visits. This is important. Where to find more information  Epilepsy Foundation: epilepsy.com  International League Against Epilepsy: ilae.org Contact a health care provider if:  Your seizure pattern changes.  You continue to have seizures with treatment.  You have symptoms of an infection or illness. Either of these might increase your risk of having a seizure.  You are unable to take your medicine. Get help right away if:  You have: ? A seizure that does not stop after 5 minutes. ? Several seizures in a row without a complete recovery between seizures. ? A seizure that makes it harder to breathe. ? A seizure that leaves you unable to speak or use a part of your body.  You did not wake up right away after a seizure.  You injure yourself during a seizure.  You have confusion or pain right after a seizure. These symptoms may represent a serious problem that is an emergency. Do not wait to see if the symptoms will go away. Get medical help right away. Call your local emergency services (911 in  the [place] Do not drive yourself to the hospital. If you ever feel like you may hurt yourself or others, or have thoughts about taking your own life, get help right away. Go to your nearest emergency department or:  Call your local emergency services (911 in the [place]  Call a suicide crisis helpline, such as the National Suicide Prevention Lifeline at 920-157-3730. This is open 24 hours a day in the [place]  Text the Crisis Text Line at (303) 064-3716 (in the [place] Summary  Epilepsy is a condition in which a person has repeated seizures over time. Some types of epilepsy will need lifelong treatment, and some types go away in time.  Seizures can cause many symptoms, such as brief staring and uncontrollable shaking or fast  movements of the arms or legs.  Treatment can control seizures. Take over-the-counter and prescription medicines only as told by your health care provider.  Follow instructions from your health care provider about driving, swimming, and doing any other activities that would be dangerous if you had a seizure.  Teach friends and family what to do if you have a seizure. This information is not intended to replace advice given to you by your health care provider. Make sure you discuss any questions you have with your health care provider. Document Revised: 06/07/2020 Document Reviewed: 06/07/2020 Elsevier Patient Education  2021 [employer].

## 2021-03-23 ENCOUNTER — Telehealth: Payer: Self-pay | Admitting: *Deleted

## 2021-03-23 LAB — LIPID PANEL
Chol/HDL Ratio: 2.1 ratio (ref 0.0–4.4)
Cholesterol, Total: 239 mg/dL — ABNORMAL HIGH (ref 100–199)
HDL: 113 mg/dL (ref >39)
LDL Chol Calc (NIH): 119 mg/dL — ABNORMAL HIGH (ref 0–99)
Triglycerides: 45 mg/dL (ref 0–149)
VLDL Cholesterol Cal: 7 mg/dL (ref 5–40)

## 2021-03-23 LAB — HEMOGLOBIN A1C
Est. average glucose Bld gHb Est-mCnc: 120 mg/dL
Hgb A1c MFr Bld: 5.8 % — ABNORMAL HIGH (ref 4.8–5.6)

## 2021-03-23 NOTE — Telephone Encounter (Signed)
LVM for daughter Kennon Rounds, on [place] requesting call back for results.

## 2021-03-23 NOTE — Progress Notes (Signed)
Kindly inform the patient that cholesterol profile is still not satisfactory with bad cholesterol and total cholesterol being high despite maximum dose of Lipitor and if she is willing I could recommend switching to new treatment in the prescription.form of injections which she has to take twice a month.  If she is willing I will prescribe Praluent or Repatha.  Screening test for diabetes was satisfactory

## 2021-03-23 NOTE — Progress Notes (Signed)
[facility] Summary Report / Loop Recorder 

## 2021-03-24 NOTE — Telephone Encounter (Signed)
Pt, or patient's daughter is active on MyChart, last login being 03/21/21, so I have sent them a message with lab results and asked that they respond to let us know whether or not they would like to switch to the cholesterol injection meds.

## 2021-03-27 ENCOUNTER — Encounter: Payer: Self-pay | Admitting: Internal Medicine

## 2021-03-28 DIAGNOSIS — Z1159 Encounter for screening for other viral diseases: Secondary | ICD-10-CM | POA: Diagnosis not present

## 2021-03-28 DIAGNOSIS — Z20828 Contact with and (suspected) exposure to other viral communicable diseases: Secondary | ICD-10-CM | POA: Diagnosis not present

## 2021-04-04 DIAGNOSIS — Z1159 Encounter for screening for other viral diseases: Secondary | ICD-10-CM | POA: Diagnosis not present

## 2021-04-04 DIAGNOSIS — Z20828 Contact with and (suspected) exposure to other viral communicable diseases: Secondary | ICD-10-CM | POA: Diagnosis not present

## 2021-04-06 ENCOUNTER — Ambulatory Visit (HOSPITAL_COMMUNITY)
Admission: RE | Admit: 2021-04-06 | Discharge: 2021-04-06 | Disposition: A | Discharge status: Home / Self Care | Payer: PPO | Source: Ambulatory Visit | Attending: Neurology | Admitting: Neurology

## 2021-04-06 ENCOUNTER — Other Ambulatory Visit: Payer: Self-pay

## 2021-04-06 DIAGNOSIS — I69320 Aphasia following cerebral infarction: Secondary | ICD-10-CM | POA: Insufficient documentation

## 2021-04-06 NOTE — Progress Notes (Signed)
Carotid artery duplex completed. Refer to "CV Proc" under chart review to view preliminary results.  04/06/2021 1:28 PM Eula Fried., MHA, RVT, RDCS, RDMS

## 2021-04-11 DIAGNOSIS — Z1159 Encounter for screening for other viral diseases: Secondary | ICD-10-CM | POA: Diagnosis not present

## 2021-04-11 DIAGNOSIS — Z20828 Contact with and (suspected) exposure to other viral communicable diseases: Secondary | ICD-10-CM | POA: Diagnosis not present

## 2021-04-13 ENCOUNTER — Ambulatory Visit: Payer: PPO | Admitting: Neurology

## 2021-04-13 DIAGNOSIS — G40009 Localization-related (focal) (partial) idiopathic epilepsy and epileptic syndromes with seizures of localized onset, not intractable, without status epilepticus: Secondary | ICD-10-CM

## 2021-04-18 ENCOUNTER — Other Ambulatory Visit: Payer: Self-pay | Admitting: Neurology

## 2021-04-18 ENCOUNTER — Ambulatory Visit (INDEPENDENT_AMBULATORY_CARE_PROVIDER_SITE_OTHER): Payer: PPO

## 2021-04-18 DIAGNOSIS — I639 Cerebral infarction, unspecified: Secondary | ICD-10-CM

## 2021-04-18 DIAGNOSIS — Z20828 Contact with and (suspected) exposure to other viral communicable diseases: Secondary | ICD-10-CM | POA: Diagnosis not present

## 2021-04-18 DIAGNOSIS — Z1159 Encounter for screening for other viral diseases: Secondary | ICD-10-CM | POA: Diagnosis not present

## 2021-04-18 LAB — CUP PACEART REMOTE DEVICE CHECK
Date Time Interrogation Session: 20220430230114
Implantable Pulse Generator Implant Date: 20210319

## 2021-04-18 MED ORDER — REPATHA SURECLICK 140 MG/ML ~~LOC~~ SOAJ: 140.0000 mg | SUBCUTANEOUS | 5 refills | Status: DC

## 2021-04-19 NOTE — Telephone Encounter (Signed)
PA for Repatha sent to [facility] [id number] Awaiting for determinatin from [facility]

## 2021-04-22 NOTE — Progress Notes (Signed)
Kindly inform the patient that EEG study was abnormal showing mild irritability of the brain on the left side which may increase chances of seizures however no definite seizure activity was noted

## 2021-04-22 NOTE — Progress Notes (Signed)
Kindly inform the patient that carotid ultrasound study did not show any significant narrowing of either carotid arteries in the neck

## 2021-04-25 ENCOUNTER — Telehealth: Payer: Self-pay | Admitting: Neurology

## 2021-04-25 ENCOUNTER — Encounter: Payer: Self-pay | Admitting: Neurology

## 2021-04-25 DIAGNOSIS — Z20828 Contact with and (suspected) exposure to other viral communicable diseases: Secondary | ICD-10-CM | POA: Diagnosis not present

## 2021-04-25 DIAGNOSIS — Z1159 Encounter for screening for other viral diseases: Secondary | ICD-10-CM | POA: Diagnosis not present

## 2021-04-25 NOTE — Telephone Encounter (Signed)
Called the daughter for the pt (on DPR) left a detailed message advising the EEG showed mild irritabillity on the brain on the left side which can increase risk for seizures but did not indicate definite seizure activity.  Also advised the Vascular US that was completed of the neck showed no narrowing on the carotid arteries and no blockages.  Informed in the VM I would also send a mychart message and if they have any questions they can call back.    Vascular US: Kindly inform the patient that carotid ultrasound study did not show any significant narrowing of either carotid arteries in the neck

## 2021-04-25 NOTE — Telephone Encounter (Signed)
-----   Message from Micki Riley, MD sent at 04/22/2021  9:52 AM EDT ----- Joneen Roach inform the patient that EEG study was abnormal showing mild irritability of the brain on the left side which may increase chances of seizures however no definite seizure activity was noted

## 2021-04-25 NOTE — Telephone Encounter (Signed)
PA Case: [id number], Status: Approved, Coverage Starts on: 04/19/2021 12:00:00 AM, Coverage Ends on: 07/18/2021 12:00:00 AM.

## 2021-04-26 ENCOUNTER — Telehealth: Payer: Self-pay | Admitting: Emergency Medicine

## 2021-04-26 NOTE — Telephone Encounter (Signed)
Patient sent mychart message regarding EEG results.

## 2021-05-05 DIAGNOSIS — Z1159 Encounter for screening for other viral diseases: Secondary | ICD-10-CM | POA: Diagnosis not present

## 2021-05-05 DIAGNOSIS — Z20828 Contact with and (suspected) exposure to other viral communicable diseases: Secondary | ICD-10-CM | POA: Diagnosis not present

## 2021-05-05 NOTE — Progress Notes (Signed)
[facility] Summary Report / Loop Recorder 

## 2021-05-06 DIAGNOSIS — H524 Presbyopia: Secondary | ICD-10-CM | POA: Diagnosis not present

## 2021-05-06 DIAGNOSIS — H401132 Primary open-angle glaucoma, bilateral, moderate stage: Secondary | ICD-10-CM | POA: Diagnosis not present

## 2021-05-06 DIAGNOSIS — H25013 Cortical age-related cataract, bilateral: Secondary | ICD-10-CM | POA: Diagnosis not present

## 2021-05-09 DIAGNOSIS — Z1159 Encounter for screening for other viral diseases: Secondary | ICD-10-CM | POA: Diagnosis not present

## 2021-05-09 DIAGNOSIS — Z20828 Contact with and (suspected) exposure to other viral communicable diseases: Secondary | ICD-10-CM | POA: Diagnosis not present

## 2021-05-19 DIAGNOSIS — M1712 Unilateral primary osteoarthritis, left knee: Secondary | ICD-10-CM | POA: Diagnosis not present

## 2021-05-22 LAB — CUP PACEART REMOTE DEVICE CHECK
Date Time Interrogation Session: 20220602230449
Implantable Pulse Generator Implant Date: 20210319

## 2021-05-23 ENCOUNTER — Ambulatory Visit (INDEPENDENT_AMBULATORY_CARE_PROVIDER_SITE_OTHER): Payer: PPO

## 2021-05-23 DIAGNOSIS — I63412 Cerebral infarction due to embolism of left middle cerebral artery: Secondary | ICD-10-CM

## 2021-05-23 DIAGNOSIS — Z1159 Encounter for screening for other viral diseases: Secondary | ICD-10-CM | POA: Diagnosis not present

## 2021-05-23 DIAGNOSIS — Z20828 Contact with and (suspected) exposure to other viral communicable diseases: Secondary | ICD-10-CM | POA: Diagnosis not present

## 2021-05-26 DIAGNOSIS — Z1159 Encounter for screening for other viral diseases: Secondary | ICD-10-CM | POA: Diagnosis not present

## 2021-05-26 DIAGNOSIS — Z20828 Contact with and (suspected) exposure to other viral communicable diseases: Secondary | ICD-10-CM | POA: Diagnosis not present

## 2021-05-30 ENCOUNTER — Other Ambulatory Visit: Payer: Self-pay

## 2021-05-30 DIAGNOSIS — Z20828 Contact with and (suspected) exposure to other viral communicable diseases: Secondary | ICD-10-CM | POA: Diagnosis not present

## 2021-05-30 DIAGNOSIS — Z1159 Encounter for screening for other viral diseases: Secondary | ICD-10-CM | POA: Diagnosis not present

## 2021-05-31 ENCOUNTER — Ambulatory Visit (INDEPENDENT_AMBULATORY_CARE_PROVIDER_SITE_OTHER): Payer: PPO | Admitting: Internal Medicine

## 2021-05-31 ENCOUNTER — Encounter: Payer: Self-pay | Admitting: Internal Medicine

## 2021-05-31 VITALS — BP 132/70 | HR 94 | Temp 98.1°F | Wt 94.9 lb

## 2021-05-31 DIAGNOSIS — R1032 Left lower quadrant pain: Secondary | ICD-10-CM

## 2021-05-31 DIAGNOSIS — R252 Cramp and spasm: Secondary | ICD-10-CM | POA: Diagnosis not present

## 2021-05-31 DIAGNOSIS — R1031 Right lower quadrant pain: Secondary | ICD-10-CM

## 2021-05-31 DIAGNOSIS — D692 Other nonthrombocytopenic purpura: Secondary | ICD-10-CM | POA: Diagnosis not present

## 2021-05-31 NOTE — Progress Notes (Signed)
Acute office Visit     This visit occurred during the SARS-CoV-2 public health emergency.  Safety protocols were in place, including screening questions prior to the visit, additional usage of staff PPE, and extensive cleaning of exam room while observing appropriate contact time as indicated for disinfecting solutions.    CC/Reason for Visit: Discuss acute concerns  HPI: Heather Nielsen is a 87 y.o. female who is coming in today for the above mentioned reasons.  She is here today with her daughter to discuss 2 acute issues:  1.  She has been having bilateral leg cramps at nighttime.  Daughter states she believes she is dehydrated, has not been drinking enough fluids especially with the heat wave we are currently having.  Cramps get better after standing.  She never gets classic claudication symptoms.  2.  She has been having bilateral lower quadrant pain, not severe, she is not constipated.  Daughter has noticed some increasing flatus.  Past Medical/Surgical History: Past Medical History:  Diagnosis Date   Arthritis    Complication of anesthesia    CVA (cerebral vascular accident) (HCC)    Hypertension    PONV (postoperative nausea and vomiting)    Stroke [facility])    Urinary incontinence     Past Surgical History:  Procedure Laterality Date   KNEE SURGERY  2007   menicus    LOOP RECORDER INSERTION N/A 03/05/2020   Procedure: LOOP RECORDER INSERTION;  Surgeon: Hillis Range, MD;  Location: [facility] INVASIVE CV LAB;  Service: Cardiovascular;  Laterality: N/A;   ROTATOR CUFF REPAIR Right 2006   TOTAL HIP ARTHROPLASTY Right 06/16/2015   Procedure: RIGHT TOTAL HIP ARTHROPLASTY ANTERIOR APPROACH;  Surgeon: Ollen Gross, MD;  Location: [facility] ORS;  Service: Orthopedics;  Laterality: Right;   TOTAL HIP ARTHROPLASTY Left 09/06/2016   Procedure: LEFT TOTAL HIP ARTHROPLASTY ANTERIOR APPROACH;  Surgeon: Ollen Gross, MD;  Location: [facility] ORS;  Service: Orthopedics;  Laterality: Left;    Social  History:  reports that she quit smoking about 36 years ago. Her smoking use included cigarettes. She has a 6.25 pack-year smoking history. She has never used smokeless tobacco. She reports current alcohol use. She reports that she does not use drugs.  Allergies: Allergies  Allergen Reactions   Penicillins Other (See Comments)    Has patient had a PCN reaction causing immediate rash, facial/tongue/throat swelling, SOB or lightheadedness with hypotension:No Has patient had a PCN reaction causing severe rash involving mucus membranes or skin necrosis:No Has patient had a PCN reaction that required hospitalization:No Has patient had a PCN reaction occurring within the last 10 years:No If all of the above answers are "NO", then may proceed with Cephalosporin use. Urinary issues--pt.says "felt like razor blades in her bladder"   Pneumococcal Vaccines Hives, Itching and Rash    Family History:  Family History  Problem Relation Age of Onset   Heart disease Mother        died age 65   CAD Brother 48     Current Outpatient Medications:    atorvastatin (LIPITOR) 80 MG tablet, TAKE ONE TABLET BY MOUTH DAILY, Disp: 90 tablet, Rfl: 1   clopidogrel (PLAVIX) 75 MG tablet, TAKE ONE TABLET BY MOUTH DAILY, Disp: 90 tablet, Rfl: 1   Evolocumab (REPATHA SURECLICK) 140 MG/ML SOAJ, Inject 140 mg into the skin every 14 (fourteen) days., Disp: 2 mL, Rfl: 5   levETIRAcetam (KEPPRA) 500 MG tablet, Take 1 tablet (500 mg total) by mouth 2 (two) times  daily., Disp: 180 tablet, Rfl: 2   lisinopril (ZESTRIL) 10 MG tablet, TAKE ONE TABLET BY MOUTH DAILY, Disp: 90 tablet, Rfl: 1  Review of Systems:  Constitutional: Denies fever, chills, diaphoresis, appetite change and fatigue.  HEENT: Denies photophobia, eye pain, redness, hearing loss, ear pain, congestion, sore throat, rhinorrhea, sneezing, mouth sores, trouble swallowing, neck pain, neck stiffness and tinnitus.   Respiratory: Denies SOB, DOE, cough, chest  tightness,  and wheezing.   Cardiovascular: Denies chest pain, palpitations and leg swelling.  Gastrointestinal: Denies nausea, vomiting, diarrhea, constipation, blood in stool. Genitourinary: Denies dysuria, urgency, frequency, hematuria, flank pain and difficulty urinating.  Endocrine: Denies: hot or cold intolerance, sweats, changes in hair or nails, polyuria, polydipsia. Musculoskeletal: Denies myalgias, back pain, joint swelling, arthralgias and gait problem.  Skin: Denies pallor, rash and wound.  Neurological: Denies dizziness, seizures, syncope, weakness, light-headedness, numbness and headaches.  Hematological: Denies adenopathy. Easy bruising, personal or family bleeding history  Psychiatric/Behavioral: Denies suicidal ideation, mood changes, confusion, nervousness, sleep disturbance and agitation    Physical Exam: Vitals:   05/31/21 1504  BP: 132/70  Pulse: 94  Temp: 98.1 F (36.7 C)  TempSrc: Oral  SpO2: 97%  Weight: 94 lb 14.4 oz (43 kg)    Body mass index is 18.17 kg/m.   Constitutional: NAD, calm, comfortable Eyes: PERRL, lids and conjunctivae normal ENMT: Mucous membranes are moist.  Respiratory: clear to auscultation bilaterally, no wheezing, no crackles. Normal respiratory effort. No accessory muscle use.  Cardiovascular: Regular rate and rhythm, no murmurs / rubs / gallops. No extremity edema. 2+ pedal pulses. Abdomen: no tenderness, no masses palpated. No hepatosplenomegaly. Bowel sounds positive.  Musculoskeletal: no clubbing / cyanosis. No joint deformity upper and lower extremities. Good ROM, no contractures. Normal muscle tone.  Psychiatric: Normal judgment and insight. Alert and oriented x 3. Normal mood.    Impression and Plan:  Bilateral leg cramps  - Plan: Comprehensive metabolic panel, Vitamin B12, VITAMIN D 25 Hydroxy (Vit-D Deficiency, Fractures), Magnesium -Suspect due to dehydration and possibly electrolyte shifting.  Abdominal pain,  bilateral lower quadrant -Likely due to gaseous distention, have advised use of as needed simethicone.  Senile purpura (HCC) -Likely due to age and presence of clopidogrel.      Chaya Jan, MD [facility]

## 2021-06-01 ENCOUNTER — Other Ambulatory Visit: Payer: Self-pay | Admitting: Internal Medicine

## 2021-06-01 ENCOUNTER — Encounter: Payer: Self-pay | Admitting: Internal Medicine

## 2021-06-01 DIAGNOSIS — E559 Vitamin D deficiency, unspecified: Secondary | ICD-10-CM

## 2021-06-01 DIAGNOSIS — E538 Deficiency of other specified B group vitamins: Secondary | ICD-10-CM | POA: Insufficient documentation

## 2021-06-01 LAB — VITAMIN D 25 HYDROXY (VIT D DEFICIENCY, FRACTURES): VITD: 30.51 ng/mL (ref 30.00–100.00)

## 2021-06-01 LAB — VITAMIN B12: Vitamin B-12: 298 pg/mL (ref 211–911)

## 2021-06-01 LAB — COMPREHENSIVE METABOLIC PANEL
ALT: 26 U/L (ref 0–35)
AST: 28 U/L (ref 0–37)
Albumin: 4.5 g/dL (ref 3.5–5.2)
Alkaline Phosphatase: 70 U/L (ref 39–117)
BUN: 20 mg/dL (ref 6–23)
CO2: 25 mEq/L (ref 19–32)
Calcium: 9.4 mg/dL (ref 8.4–10.5)
Chloride: 98 mEq/L (ref 96–112)
Creatinine, Ser: 0.76 mg/dL (ref 0.40–1.20)
GFR: 71.53 mL/min (ref >60.00)
Glucose, Bld: 99 mg/dL (ref 70–99)
Potassium: 4.1 mEq/L (ref 3.5–5.1)
Sodium: 132 mEq/L — ABNORMAL LOW (ref 135–145)
Total Bilirubin: 0.8 mg/dL (ref 0.2–1.2)
Total Protein: 7.1 g/dL (ref 6.0–8.3)

## 2021-06-01 LAB — MAGNESIUM: Magnesium: 1.9 mg/dL (ref 1.5–2.5)

## 2021-06-01 MED ORDER — VITAMIN D (ERGOCALCIFEROL) 1.25 MG (50000 UNIT) PO CAPS: 50000.0000 [IU] | ORAL_CAPSULE | ORAL | 0 refills | Status: AC

## 2021-06-01 MED ORDER — VITAMIN B-12 1000 MCG PO TABS: 1000.0000 ug | ORAL_TABLET | Freq: Every day | ORAL | Status: DC

## 2021-06-06 DIAGNOSIS — Z1159 Encounter for screening for other viral diseases: Secondary | ICD-10-CM | POA: Diagnosis not present

## 2021-06-06 DIAGNOSIS — Z20828 Contact with and (suspected) exposure to other viral communicable diseases: Secondary | ICD-10-CM | POA: Diagnosis not present

## 2021-06-13 DIAGNOSIS — Z20828 Contact with and (suspected) exposure to other viral communicable diseases: Secondary | ICD-10-CM | POA: Diagnosis not present

## 2021-06-13 DIAGNOSIS — Z1159 Encounter for screening for other viral diseases: Secondary | ICD-10-CM | POA: Diagnosis not present

## 2021-06-13 NOTE — Progress Notes (Signed)
[facility] Summary Report / Loop Recorder 

## 2021-06-15 ENCOUNTER — Other Ambulatory Visit: Payer: Self-pay | Admitting: Internal Medicine

## 2021-06-20 DIAGNOSIS — Z1159 Encounter for screening for other viral diseases: Secondary | ICD-10-CM | POA: Diagnosis not present

## 2021-06-20 DIAGNOSIS — Z20828 Contact with and (suspected) exposure to other viral communicable diseases: Secondary | ICD-10-CM | POA: Diagnosis not present

## 2021-06-26 LAB — CUP PACEART REMOTE DEVICE CHECK
Date Time Interrogation Session: 20220705230909
Implantable Pulse Generator Implant Date: 20210319

## 2021-06-27 ENCOUNTER — Ambulatory Visit (INDEPENDENT_AMBULATORY_CARE_PROVIDER_SITE_OTHER): Payer: PPO

## 2021-06-27 DIAGNOSIS — I639 Cerebral infarction, unspecified: Secondary | ICD-10-CM

## 2021-06-30 DIAGNOSIS — Z1159 Encounter for screening for other viral diseases: Secondary | ICD-10-CM | POA: Diagnosis not present

## 2021-06-30 DIAGNOSIS — Z20828 Contact with and (suspected) exposure to other viral communicable diseases: Secondary | ICD-10-CM | POA: Diagnosis not present

## 2021-07-11 DIAGNOSIS — Z1159 Encounter for screening for other viral diseases: Secondary | ICD-10-CM | POA: Diagnosis not present

## 2021-07-11 DIAGNOSIS — Z20828 Contact with and (suspected) exposure to other viral communicable diseases: Secondary | ICD-10-CM | POA: Diagnosis not present

## 2021-07-18 DIAGNOSIS — Z20828 Contact with and (suspected) exposure to other viral communicable diseases: Secondary | ICD-10-CM | POA: Diagnosis not present

## 2021-07-18 DIAGNOSIS — Z1159 Encounter for screening for other viral diseases: Secondary | ICD-10-CM | POA: Diagnosis not present

## 2021-07-18 NOTE — Progress Notes (Signed)
[facility] Summary Report / Loop Recorder 

## 2021-07-20 NOTE — Telephone Encounter (Signed)
PA Case: [id number], Status: Approved, Coverage Starts on: 07/20/2021 12:00:00 AM, Coverage Ends on: 07/20/2022 12:00:00 AM.

## 2021-07-20 NOTE — Telephone Encounter (Signed)
PA for Repatha completed on [facility] [id number] Awaiting determination from [employer]

## 2021-08-01 ENCOUNTER — Ambulatory Visit (INDEPENDENT_AMBULATORY_CARE_PROVIDER_SITE_OTHER): Payer: PPO

## 2021-08-01 DIAGNOSIS — I639 Cerebral infarction, unspecified: Secondary | ICD-10-CM

## 2021-08-01 LAB — CUP PACEART REMOTE DEVICE CHECK
Date Time Interrogation Session: 20220807230309
Implantable Pulse Generator Implant Date: 20210319

## 2021-08-10 DIAGNOSIS — M25562 Pain in left knee: Secondary | ICD-10-CM | POA: Diagnosis not present

## 2021-08-19 NOTE — Progress Notes (Signed)
[facility] Summary Report / Loop Recorder 

## 2021-08-24 ENCOUNTER — Telehealth: Payer: Self-pay | Admitting: Internal Medicine

## 2021-08-24 NOTE — Telephone Encounter (Signed)
A form was dropped off on behalf of the patient that is needing to be filled out by Dr. Ardyth Harps.   The form should be faxed to [employer] at [phone].   Form will be placed in folder.  Please advise.

## 2021-08-26 NOTE — Telephone Encounter (Signed)
Placed in Dr Hernandez's folder 

## 2021-08-26 NOTE — Telephone Encounter (Signed)
Form faxed and confirmed

## 2021-08-29 ENCOUNTER — Other Ambulatory Visit: Payer: Self-pay | Admitting: Internal Medicine

## 2021-08-29 DIAGNOSIS — E559 Vitamin D deficiency, unspecified: Secondary | ICD-10-CM

## 2021-08-30 ENCOUNTER — Other Ambulatory Visit: Payer: Self-pay | Admitting: Internal Medicine

## 2021-08-30 DIAGNOSIS — E559 Vitamin D deficiency, unspecified: Secondary | ICD-10-CM

## 2021-09-01 ENCOUNTER — Other Ambulatory Visit: Payer: Self-pay | Admitting: Internal Medicine

## 2021-09-01 DIAGNOSIS — E559 Vitamin D deficiency, unspecified: Secondary | ICD-10-CM

## 2021-09-01 LAB — CUP PACEART REMOTE DEVICE CHECK
Date Time Interrogation Session: 20220909230514
Implantable Pulse Generator Implant Date: 20210319

## 2021-09-05 ENCOUNTER — Ambulatory Visit (INDEPENDENT_AMBULATORY_CARE_PROVIDER_SITE_OTHER): Payer: PPO

## 2021-09-05 DIAGNOSIS — I639 Cerebral infarction, unspecified: Secondary | ICD-10-CM | POA: Diagnosis not present

## 2021-09-08 NOTE — Progress Notes (Signed)
[facility] Summary Report / Loop Recorder 

## 2021-09-12 ENCOUNTER — Other Ambulatory Visit: Payer: Self-pay | Admitting: Internal Medicine

## 2021-09-21 ENCOUNTER — Ambulatory Visit: Payer: PPO | Admitting: Neurology

## 2021-09-21 ENCOUNTER — Other Ambulatory Visit: Payer: Self-pay

## 2021-09-21 VITALS — BP 180/69 | HR 74 | Ht 62.0 in | Wt 96.0 lb

## 2021-09-21 DIAGNOSIS — G40009 Localization-related (focal) (partial) idiopathic epilepsy and epileptic syndromes with seizures of localized onset, not intractable, without status epilepticus: Secondary | ICD-10-CM | POA: Diagnosis not present

## 2021-09-21 DIAGNOSIS — E7800 Pure hypercholesterolemia, unspecified: Secondary | ICD-10-CM

## 2021-09-21 DIAGNOSIS — Z8673 Personal history of transient ischemic attack (TIA), and cerebral infarction without residual deficits: Secondary | ICD-10-CM | POA: Diagnosis not present

## 2021-09-21 NOTE — Progress Notes (Signed)
[facility] [address] [place]. [place] [address] [phone]       OFFICE FOLLOW UPVISIT NOTE  [name] Date of Birth:  [birth year] Medical Record Number:  [id number]   Referring MD: Peggye Pitt  Reason for Referral: Seizure HPI: Initial visit 03/22/2021 [name] is a 87 year old pleasant Caucasian lady seen for consultation visit today for seizures.  History is obtained from the patient and her daughter as well as review of electronic medical records and personally reviewed imaging films in PACS.  She has past medical history of hypertension, hyperlipidemia, arthritis and cryptogenic left MCA infarct on 03/04/2020 with residual expressive aphasia.  She was last seen by me for follow-up further on 04/06/2020 and still had significant aphasia.  She has been living at independent living at [facility] but follow-up 3 to 4 weeks prior to 11/21/2020 when she was seen at the hospital she was having some increasing episodes of labile affect with easy crying and laughter and refusal daily checks.  Patient then was noted as having sudden onset of speech difficulty and disorientation.  The daughter called 911 and when they arrived they found the patient to have stool incontinence and significant amount of diarrhea in the toilet and she had soiled herself.  There was no reported tongue biting there was no witnessed seizure activity but patient was quite confused and disoriented.  There is no signs of any recent infection.  Patient had somewhat similar episode a few weeks ago when she had soiled herself and was confused and disoriented after that.  Patient had an EEG done which showed continuous 3 to 6 Hz Teater delta range slowing in the left hemisphere maximal in the left temporal region.  MRI scan of the brain showed no acute abnormality but did show remote infarcts in left basal ganglia/corona radiata, left thalamus and left cerebellum with left temporal lobe as well.  There  was 8 mm meningioma noted in the left frontotemporal convexity without mass-effect.  Comprehensive metabolic panel was significant only for slightly low sodium of 133.  CBC magnesium and phosphorus are normal.  UA was not checked.  Covid test was negative.  Patient started on Keppra for presumed seizures and has done well since then.  She is tolerating finder milligram twice daily fairly well without any behavioral side effects no dizziness.  She has had no further episodes of incontinence or confusion disorientation.  She remains on Plavix for stroke prevention and is tolerating well without bruising or bleeding.  Blood pressure has been high in the 150s is usually today it is 166/77.  She is tolerating Lipitor well without muscle aches and pains but not sure when last lipid profile was checked.  There is no prior history of seizures, significant head injury with loss of consciousness.  Patient still fiercely independent and tries to take her own blood pressure and take her own medicines.  She was given a prescription for Keppra but she ran out of it 2 days ago. Update 09/21/2021: She returns for follow-up after last visit 6 months ago.  She is accompanied by her daughter.  She states she is doing well.  She has had no seizure or seizure-like episodes of confusion or disorientation.  She is tolerating Keppra well without any side effects.  She had lab work done on 03/22/2021 which showed hemoglobin A1c 5.8.  Total cholesterol was 239, LDL cholesterol was elevated at 1 1 9  mg percent and HDL was also 1 1 3  mg  percent.  EEG on 04/19/2021 showed focal left temporal slowing but no definite epileptiform activity.  She has since been started on Repatha injections by me and seems to be tolerating it well without any side effects.  She continues to have occasional word finding difficulties and word hesitancy but is learned to talk slowly.  She has moved and is now staying at [facility] independent living and likes the place  much better than the older 1.  She has no new complaints.  She is tolerating Plavix well but does have minor bruising.  She is still on Lipitor 80 mg but she has not had any follow-up lipid profile checked to see if we can reduce this.  She continues to have frequent urinary incontinence but has not yet discussed this with primary care physician. ROS:   14 system review of systems is positive for incontinence speech difficulties, confusion, disorientation, incontinence of stool, emotional lability all other systems negative  PMH:  Past Medical History:  Diagnosis Date   Arthritis    Complication of anesthesia    CVA (cerebral vascular accident) (HCC)    Hypertension    PONV (postoperative nausea and vomiting)    Stroke [facility])    Urinary incontinence     Social History:  Social History   Socioeconomic History   Marital status: Widowed    Spouse name: Not on file   Number of children: 5   Years of education: Not on file   Highest education level: Not on file  Occupational History   Not on file  Tobacco Use   Smoking status: Former    Packs/day: 0.25    Years: 25.00    Pack years: 6.25    Types: Cigarettes    Quit date: 12/18/1984    Years since quitting: 36.7   Smokeless tobacco: Never  Substance and Sexual Activity   Alcohol use: Yes    Alcohol/week: 0.0 standard drinks    Comment: gin and tonic and glass of wine daily    Drug use: No   Sexual activity: Not on file  Other Topics Concern   Not on file  Social History Narrative   Lives alone   Right Handed   Drinks 1-2 cups caffeine daily   Social Determinants of Health   Financial Resource Strain: Not on file  Food Insecurity: Not on file  Transportation Needs: Not on file  Physical Activity: Not on file  Stress: Not on file  Social Connections: Not on file  Intimate Partner Violence: Not on file    Medications:   Current Outpatient Medications on File Prior to Visit  Medication Sig Dispense Refill    atorvastatin (LIPITOR) 80 MG tablet TAKE ONE TABLET BY MOUTH DAILY 90 tablet 1   clopidogrel (PLAVIX) 75 MG tablet TAKE ONE TABLET BY MOUTH DAILY 90 tablet 1   Evolocumab (REPATHA SURECLICK) 140 MG/ML SOAJ Inject 140 mg into the skin every 14 (fourteen) days. 2 mL 5   levETIRAcetam (KEPPRA) 500 MG tablet Take 1 tablet (500 mg total) by mouth 2 (two) times daily. 180 tablet 2   lisinopril (ZESTRIL) 10 MG tablet TAKE ONE TABLET BY MOUTH DAILY 90 tablet 1   vitamin B-12 (CYANOCOBALAMIN) 1000 MCG tablet Take 1 tablet (1,000 mcg total) by mouth daily.     No current facility-administered medications on file prior to visit.    Allergies:   Allergies  Allergen Reactions   Penicillins Other (See Comments)    Has patient had a PCN reaction  causing immediate rash, facial/tongue/throat swelling, SOB or lightheadedness with hypotension:No Has patient had a PCN reaction causing severe rash involving mucus membranes or skin necrosis:No Has patient had a PCN reaction that required hospitalization:No Has patient had a PCN reaction occurring within the last 10 years:No If all of the above answers are "NO", then may proceed with Cephalosporin use. Urinary issues--pt.says "felt like razor blades in her bladder"   Pneumococcal Vaccines Hives, Itching and Rash    Physical Exam General: Frail elderly petite Caucasian lady, seated, in no evident distress Head: head normocephalic and atraumatic.   Neck: supple with no carotid or supraclavicular bruits Cardiovascular: regular rate and rhythm, no murmurs Musculoskeletal: no deformity mild kyphoscoliosis Skin:  no rash/petichiae Vascular:  Normal pulses all extremities  Neurologic Exam Mental Status: Awake and fully alert. Oriented to place and time. Recent and remote memory diminished. Attention span, concentration and fund of knowledge diminished mood and affect slightly inappropriate with mild pseudobulbar affect..  Mild expressive aphasia with nonfluent  speech with occasional paraphasic errors and word finding difficulties.  Comprehension seems better preserved.  Difficulty with naming but able to repeat quite well. Cranial Nerves: Fundoscopic exam not done s. Pupils equal, briskly reactive to light. Extraocular movements full without nystagmus. Visual fields full to confrontation. Hearing intact. Facial sensation intact. Face, tongue, palate moves normally and symmetrically.  Motor: Normal bulk and tone. Normal strength in all tested extremity muscles.  Diminished fine finger movements on the right.  Orbits left over right upper extremity. Sensory.: intact to touch , pinprick , position and vibratory sensation.  Coordination: Rapid alternating movements normal in all extremities. Finger-to-nose and heel-to-shin performed accurately bilaterally. Gait and Station: Arises from chair without difficulty. Stance is normal. Gait demonstrates normal stride length and balance . Able to heel, toe and tandem walk with mild difficulty.  Reflexes: 1+ and symmetric. Toes downgoing.       ASSESSMENT: 62 old patient with 2 episodes of worsening aphasia confusion disorientation and stool incontinence possibly unwitnessed seizures with postictal confusion symptomatic seizures from her remote left MCA infarct.  History of cryptogenic left MCA infarct with residual aphasia in March 2021.  Vascular risk factors of hypertension and hyperlipidemia.  She also has mild pseudobulbar affect but not severe enough to justify medications at the present time.    PLAN: I had a long discussion with the patient regarding her episodes of paresthesias possibly simple partial seizures which seem to have responded well to Keppra.  I recommend she continue with the current dose of 500 mg twice daily and continue Plavix for secondary stroke prevention with strict control of hypertension with blood pressure goal below 12/21/1938/90 and lipids with LDL cholesterol goal below 70 mg percent.   Continue Praluent injections and check follow-up lipid profile today.  She will return for follow-up in the future in a year or call earlier if necessary. I advised her to see a primary care physician to discuss her frequent urinary incontinence work-up and treatment.  Greater than 50% time during this 25-minute visit was spent in counseling and coordination of care about her episode of possible seizures and remote stroke and answering questions. Delia Heady, MD Note: This document was prepared with digital dictation and possible smart phrase technology. Any transcriptional errors that result from this process are unintentional.

## 2021-09-21 NOTE — Patient Instructions (Signed)
I had a long discussion with the patient regarding her episodes of paresthesias possibly simple partial seizures which seem to have responded well to Keppra.  I recommend she continue with the current dose of 500 mg twice daily and continue Plavix for secondary stroke prevention with strict control of hypertension with blood pressure goal below 12/21/1938/90 and lipids with LDL cholesterol goal below 70 mg percent.  Continue Praluent injections and check follow-up lipid profile today.  She will return for follow-up in the future in a year or call earlier if necessary.

## 2021-09-22 LAB — LIPID PANEL
Chol/HDL Ratio: 2.2 ratio (ref 0.0–4.4)
Cholesterol, Total: 299 mg/dL — ABNORMAL HIGH (ref 100–199)
HDL: 135 mg/dL (ref >39)
LDL Chol Calc (NIH): 158 mg/dL — ABNORMAL HIGH (ref 0–99)
Triglycerides: 48 mg/dL (ref 0–149)
VLDL Cholesterol Cal: 6 mg/dL (ref 5–40)

## 2021-10-03 ENCOUNTER — Telehealth: Payer: Self-pay | Admitting: *Deleted

## 2021-10-03 NOTE — Telephone Encounter (Signed)
Ms. Heather Nielsen said, Heather Nielsen is taking Lipitor, She is taking both the shot and Lipitor. Can call back if you have any questions.

## 2021-10-03 NOTE — Telephone Encounter (Signed)
I spoke to her daughter on [place]. States the patient is only taking Repatha 140mg , one injection every 14 days. Would you like her to also restart Lipitor 80mg  daily, along with the injection?

## 2021-10-03 NOTE — Telephone Encounter (Signed)
The patient's daughter call the office again Harrell Lark -  #[phone]).  States the patient is taking both atorvastation 80mg  daily and Repatha 140mg , one injection every 14 days.   She would like to know next step.

## 2021-10-03 NOTE — Telephone Encounter (Signed)
-----   Message from Micki Riley, MD sent at 09/30/2021  3:59 PM EDT ----- Joneen Roach inform the patient that her bad cholesterol is significantly elevated.  If she is taking Lipitor 80 mg every day I may need to start her on an new injection medication in addition..  In case she is not taking her medicine she needs to take it every day ----- Message ----- From: Interface, [facility] Lab Results In Sent: 09/22/2021   5:37 AM EDT To: Micki Riley, MD

## 2021-10-03 NOTE — Telephone Encounter (Signed)
Left message for her daughter, Harrell Lark, on DPR to return our call.

## 2021-10-04 NOTE — Telephone Encounter (Signed)
Pt's dgt sent another correction through mychart. The patient is now taking both Lipitor and Repatha. However, she just restarted Lipitor after being off it for six weeks. How would you like to proceed?  Messsage from dgt:  I told Marcelino Duster yesterday that my mom was only taking the shot. I talked to my sister who just got back from out of town and she confirmed that Mom did not take the lipitor for about 6 weeks because she thought it was just the shot.  Mom is now on both lipitor and the Repatha shot. Sorry about all the confusion.   I am assuming we should wait a few months and retest her cholesterol level.  Again, so sorry as we typically have a better handle on her medication.  Kennon Rounds

## 2021-10-04 NOTE — Telephone Encounter (Signed)
I called the patient's daughter, Kennon Rounds, and provided Dr.Sethi's response. She will make sure her mother is taking both Lipitor and Repatha. She will be seeing her PCP w/ [facility] soon and she plans to have him order the repeat lipid panel in a few months.

## 2021-10-04 NOTE — Telephone Encounter (Signed)
I added the medication update below to the existing phone note for Dr. Pearlean Brownie to review.

## 2021-10-10 ENCOUNTER — Ambulatory Visit (INDEPENDENT_AMBULATORY_CARE_PROVIDER_SITE_OTHER): Payer: PPO

## 2021-10-10 DIAGNOSIS — I63412 Cerebral infarction due to embolism of left middle cerebral artery: Secondary | ICD-10-CM

## 2021-10-10 LAB — CUP PACEART REMOTE DEVICE CHECK
Date Time Interrogation Session: 20221023230332
Implantable Pulse Generator Implant Date: 20210319

## 2021-10-17 NOTE — Progress Notes (Signed)
[facility] Summary Report / Loop Recorder 

## 2021-10-31 ENCOUNTER — Other Ambulatory Visit: Payer: Self-pay | Admitting: Neurology

## 2021-11-01 ENCOUNTER — Telehealth: Payer: Self-pay | Admitting: Neurology

## 2021-11-01 ENCOUNTER — Other Ambulatory Visit: Payer: Self-pay | Admitting: *Deleted

## 2021-11-01 ENCOUNTER — Telehealth: Payer: Self-pay | Admitting: Internal Medicine

## 2021-11-01 DIAGNOSIS — Z8673 Personal history of transient ischemic attack (TIA), and cerebral infarction without residual deficits: Secondary | ICD-10-CM

## 2021-11-01 MED ORDER — LEVETIRACETAM 500 MG PO TABS: 500.0000 mg | ORAL_TABLET | Freq: Two times a day (BID) | ORAL | 3 refills | Status: DC

## 2021-11-01 MED ORDER — LISINOPRIL 10 MG PO TABS: 10.0000 mg | ORAL_TABLET | Freq: Every day | ORAL | 1 refills | Status: DC

## 2021-11-01 MED ORDER — ATORVASTATIN CALCIUM 80 MG PO TABS: 80.0000 mg | ORAL_TABLET | Freq: Every day | ORAL | 1 refills | Status: DC

## 2021-11-01 MED ORDER — CLOPIDOGREL BISULFATE 75 MG PO TABS: 75.0000 mg | ORAL_TABLET | Freq: Every day | ORAL | 1 refills | Status: DC

## 2021-11-01 NOTE — Telephone Encounter (Signed)
Refills sent

## 2021-11-01 NOTE — Telephone Encounter (Signed)
Heather Nielsen called from [facility] to say there will need to be new prescriptions for atorvastatin (LIPITOR) 80 MG tablet clopidogrel (PLAVIX) 75 MG tablet lisinopril (ZESTRIL) 10 MG tablet     Please send to [facility] [place], [place] - [address] [place] Phone:  [phone]  Fax:  [phone]          Good callback number for Heather Nielsen [phone]     Please advise

## 2021-11-01 NOTE — Addendum Note (Signed)
Addended by: Lindell Spar C on: 11/01/2021 11:01 AM   Modules accepted: Orders

## 2021-11-01 NOTE — Telephone Encounter (Signed)
Rx faxed into [facility] w/ [facility] earlier today. Rx now e-scribed to the requested location below. I the called [facility] Fidela Juneau) back and left a message with this information. Provided our number to call with any questions.

## 2021-11-01 NOTE — Telephone Encounter (Signed)
[facility] Fidela Juneau) request refill for levETIRAcetam (KEPPRA) 500 MG tablet at [facility] [address] [place], [place] [address]  This will be a permanent change for all patient medication.

## 2021-11-08 ENCOUNTER — Ambulatory Visit (INDEPENDENT_AMBULATORY_CARE_PROVIDER_SITE_OTHER): Payer: PPO | Admitting: Adult Health

## 2021-11-08 ENCOUNTER — Encounter: Payer: Self-pay | Admitting: Adult Health

## 2021-11-08 VITALS — BP 128/80 | HR 69 | Temp 97.1°F | Ht 62.0 in | Wt 98.2 lb

## 2021-11-08 DIAGNOSIS — R197 Diarrhea, unspecified: Secondary | ICD-10-CM | POA: Diagnosis not present

## 2021-11-08 DIAGNOSIS — M795 Residual foreign body in soft tissue: Secondary | ICD-10-CM

## 2021-11-08 NOTE — Progress Notes (Signed)
Subjective:    Patient ID: Heather Nielsen, female    DOB: [birth year], 87 y.o.   MRN: [id number]  HPI  87 year old female who  has a past medical history of Arthritis, Complication of anesthesia, CVA (cerebral vascular accident) (HCC), Hypertension, PONV (postoperative nausea and vomiting), Stroke (HCC), and Urinary incontinence.  She is a patient of Dr. Ardyth Harps who I am seeing today for an acute issue. Her complaint today is that of a " hard spot in the middle of my chest". She first noticed this " hard spot: about two weeks ago. She had a loop recorder placed about 2 years ago   Has also been experiencing intermittent episodes of diarrhea that has been preventing her from swimming. Has been going on for the last few days. She denies blood in her stool or recent abx use. Denies abdominal pain or cramping.   Review of Systems See HPI   Past Medical History:  Diagnosis Date   Arthritis    Complication of anesthesia    CVA (cerebral vascular accident) (HCC)    Hypertension    PONV (postoperative nausea and vomiting)    Stroke [facility])    Urinary incontinence     Social History   Socioeconomic History   Marital status: Widowed    Spouse name: Not on file   Number of children: 5   Years of education: Not on file   Highest education level: Not on file  Occupational History   Not on file  Tobacco Use   Smoking status: Former    Packs/day: 0.25    Years: 25.00    Pack years: 6.25    Types: Cigarettes    Quit date: 12/18/1984    Years since quitting: 36.9   Smokeless tobacco: Never  Substance and Sexual Activity   Alcohol use: Yes    Alcohol/week: 0.0 standard drinks    Comment: gin and tonic and glass of wine daily    Drug use: No   Sexual activity: Not on file  Other Topics Concern   Not on file  Social History Narrative   Lives alone   Right Handed   Drinks 1-2 cups caffeine daily   Social Determinants of Health   Financial Resource Strain: Not on file  Food  Insecurity: Not on file  Transportation Needs: Not on file  Physical Activity: Not on file  Stress: Not on file  Social Connections: Not on file  Intimate Partner Violence: Not on file    Past Surgical History:  Procedure Laterality Date   KNEE SURGERY  2007   menicus    LOOP RECORDER INSERTION N/A 03/05/2020   Procedure: LOOP RECORDER INSERTION;  Surgeon: Hillis Range, MD;  Location: [facility] INVASIVE [facility] LAB;  Service: Cardiovascular;  Laterality: N/A;   ROTATOR CUFF REPAIR Right 2006   TOTAL HIP ARTHROPLASTY Right 06/16/2015   Procedure: RIGHT TOTAL HIP ARTHROPLASTY ANTERIOR APPROACH;  Surgeon: Ollen Gross, MD;  Location: [facility] ORS;  Service: Orthopedics;  Laterality: Right;   TOTAL HIP ARTHROPLASTY Left 09/06/2016   Procedure: LEFT TOTAL HIP ARTHROPLASTY ANTERIOR APPROACH;  Surgeon: Ollen Gross, MD;  Location: [facility] OR[facility];  Service: Orthopedics;  Laterality: Left;    Family History  Problem Relation Age of Onset   Heart disease Mother        died age 55   CAD Brother 47    Allergies  Allergen Reactions   Penicillins Other (See Comments)    Has patient had a PCN reaction causing  immediate rash, facial/tongue/throat swelling, SOB or lightheadedness with hypotension:No Has patient had a PCN reaction causing severe rash involving mucus membranes or skin necrosis:No Has patient had a PCN reaction that required hospitalization:No Has patient had a PCN reaction occurring within the last 10 years:No If all of the above answers are "NO", then may proceed with Cephalosporin use. Urinary issues--pt.says "felt like razor blades in her bladder"   Pneumococcal Vaccines Hives, Itching and Rash    Current Outpatient Medications on File Prior to Visit  Medication Sig Dispense Refill   atorvastatin (LIPITOR) 80 MG tablet Take 1 tablet (80 mg total) by mouth daily. 90 tablet 1   clopidogrel (PLAVIX) 75 MG tablet Take 1 tablet (75 mg total) by mouth daily. 90 tablet 1   levETIRAcetam (KEPPRA) 500 MG  tablet Take 1 tablet (500 mg total) by mouth 2 (two) times daily. 180 tablet 3   lisinopril (ZESTRIL) 10 MG tablet Take 1 tablet (10 mg total) by mouth daily. 90 tablet 1   REPATHA SURECLICK 140 MG/ML SOAJ INJECT 140 MG INTO THE SKIN EVERY 14 DAYS 2 mL 11   vitamin B-12 (CYANOCOBALAMIN) 1000 MCG tablet Take 1 tablet (1,000 mcg total) by mouth daily.     No current facility-administered medications on file prior to visit.    BP 128/80   Pulse 69   Temp (!) 97.1 F (36.2 C) (Oral)   Ht 5\' 2"  (1.575 m)   Wt 98 lb 3.2 oz (44.5 kg)   SpO2 96%   BMI 17.96 kg/m       Objective:   Physical Exam Vitals and nursing note reviewed.  Constitutional:      Appearance: Normal appearance.  Cardiovascular:     Rate and Rhythm: Regular rhythm.  Chest:    Abdominal:     General: Abdomen is flat. Bowel sounds are normal.     Palpations: Abdomen is soft.     Tenderness: There is no abdominal tenderness.  Musculoskeletal:     Comments: Approx 1.5 inch oblong foreign body felt on left chest wall/breast  Skin:    General: Skin is warm and dry.     Capillary Refill: Capillary refill takes less than 2 seconds.  Neurological:     General: No focal deficit present.     Mental Status: She is alert and oriented to person, place, and time.      Assessment & Plan:  1. Foreign body (FB) in soft tissue - what she is feeling is her loop recorder  - reassurance given. No signs of infection   2. Diarrhea, unspecified type - Pepto Bismol x 2-3 days  - Follow up if no improvement in the next 2-3 days   , NP

## 2021-11-12 LAB — CUP PACEART REMOTE DEVICE CHECK
Date Time Interrogation Session: 20221125230250
Implantable Pulse Generator Implant Date: 20210319

## 2021-11-14 ENCOUNTER — Ambulatory Visit (INDEPENDENT_AMBULATORY_CARE_PROVIDER_SITE_OTHER): Payer: PPO

## 2021-11-14 DIAGNOSIS — I63412 Cerebral infarction due to embolism of left middle cerebral artery: Secondary | ICD-10-CM | POA: Diagnosis not present

## 2021-11-14 DIAGNOSIS — M1712 Unilateral primary osteoarthritis, left knee: Secondary | ICD-10-CM | POA: Diagnosis not present

## 2021-11-18 ENCOUNTER — Ambulatory Visit: Payer: PPO | Admitting: Internal Medicine

## 2021-11-21 NOTE — Progress Notes (Signed)
[facility] Summary Report / Loop Recorder 

## 2021-11-22 ENCOUNTER — Inpatient Hospital Stay (HOSPITAL_COMMUNITY): Payer: PPO | Admitting: Certified Registered Nurse Anesthetist

## 2021-11-22 ENCOUNTER — Emergency Department (HOSPITAL_BASED_OUTPATIENT_CLINIC_OR_DEPARTMENT_OTHER): Payer: PPO

## 2021-11-22 ENCOUNTER — Inpatient Hospital Stay (HOSPITAL_BASED_OUTPATIENT_CLINIC_OR_DEPARTMENT_OTHER)
Admission: EM | Admit: 2021-11-22 | Discharge: 2021-11-24 | DRG: 352 | Disposition: A | Discharge status: Nursing Home (Includes ICF) | Payer: PPO | Source: Skilled Nursing Facility | Attending: General Surgery | Admitting: General Surgery

## 2021-11-22 ENCOUNTER — Encounter (HOSPITAL_BASED_OUTPATIENT_CLINIC_OR_DEPARTMENT_OTHER): Payer: Self-pay

## 2021-11-22 ENCOUNTER — Other Ambulatory Visit: Payer: Self-pay

## 2021-11-22 ENCOUNTER — Encounter (HOSPITAL_COMMUNITY)
Admission: EM | Disposition: A | Discharge status: Nursing Home (Includes ICF) | Payer: Self-pay | Source: Skilled Nursing Facility

## 2021-11-22 DIAGNOSIS — I10 Essential (primary) hypertension: Secondary | ICD-10-CM | POA: Diagnosis present

## 2021-11-22 DIAGNOSIS — Z7902 Long term (current) use of antithrombotics/antiplatelets: Secondary | ICD-10-CM

## 2021-11-22 DIAGNOSIS — K4090 Unilateral inguinal hernia, without obstruction or gangrene, not specified as recurrent: Secondary | ICD-10-CM

## 2021-11-22 DIAGNOSIS — Z87891 Personal history of nicotine dependence: Secondary | ICD-10-CM

## 2021-11-22 DIAGNOSIS — K5939 Other megacolon: Secondary | ICD-10-CM | POA: Diagnosis not present

## 2021-11-22 DIAGNOSIS — Z880 Allergy status to penicillin: Secondary | ICD-10-CM

## 2021-11-22 DIAGNOSIS — Z8673 Personal history of transient ischemic attack (TIA), and cerebral infarction without residual deficits: Secondary | ICD-10-CM | POA: Diagnosis not present

## 2021-11-22 DIAGNOSIS — M1611 Unilateral primary osteoarthritis, right hip: Secondary | ICD-10-CM | POA: Diagnosis present

## 2021-11-22 DIAGNOSIS — K4030 Unilateral inguinal hernia, with obstruction, without gangrene, not specified as recurrent: Principal | ICD-10-CM | POA: Diagnosis present

## 2021-11-22 DIAGNOSIS — Z79899 Other long term (current) drug therapy: Secondary | ICD-10-CM

## 2021-11-22 DIAGNOSIS — R55 Syncope and collapse: Secondary | ICD-10-CM | POA: Diagnosis not present

## 2021-11-22 DIAGNOSIS — E785 Hyperlipidemia, unspecified: Secondary | ICD-10-CM | POA: Diagnosis not present

## 2021-11-22 DIAGNOSIS — Z20822 Contact with and (suspected) exposure to covid-19: Secondary | ICD-10-CM | POA: Diagnosis present

## 2021-11-22 DIAGNOSIS — K6389 Other specified diseases of intestine: Secondary | ICD-10-CM | POA: Diagnosis not present

## 2021-11-22 DIAGNOSIS — K769 Liver disease, unspecified: Secondary | ICD-10-CM | POA: Diagnosis not present

## 2021-11-22 DIAGNOSIS — I639 Cerebral infarction, unspecified: Secondary | ICD-10-CM | POA: Diagnosis not present

## 2021-11-22 DIAGNOSIS — E559 Vitamin D deficiency, unspecified: Secondary | ICD-10-CM | POA: Diagnosis not present

## 2021-11-22 HISTORY — PX: INGUINAL HERNIA REPAIR: SHX194

## 2021-11-22 LAB — CBC WITH DIFFERENTIAL/PLATELET
Abs Immature Granulocytes: 0.05 10*3/uL (ref 0.00–0.07)
Basophils Absolute: 0.1 10*3/uL (ref 0.0–0.1)
Basophils Relative: 0 %
Eosinophils Absolute: 0 10*3/uL (ref 0.0–0.5)
Eosinophils Relative: 0 %
HCT: 40.3 % (ref 36.0–46.0)
Hemoglobin: 13.6 g/dL (ref 12.0–15.0)
Immature Granulocytes: 0 %
Lymphocytes Relative: 8 %
Lymphs Abs: 1.1 10*3/uL (ref 0.7–4.0)
MCH: 30.9 pg (ref 26.0–34.0)
MCHC: 33.7 g/dL (ref 30.0–36.0)
MCV: 91.6 fL (ref 80.0–100.0)
Monocytes Absolute: 1.1 10*3/uL — ABNORMAL HIGH (ref 0.1–1.0)
Monocytes Relative: 8 %
Neutro Abs: 11.7 10*3/uL — ABNORMAL HIGH (ref 1.7–7.7)
Neutrophils Relative %: 84 %
Platelets: 321 10*3/uL (ref 150–400)
RBC: 4.4 MIL/uL (ref 3.87–5.11)
RDW: 13 % (ref 11.5–15.5)
WBC: 14 10*3/uL — ABNORMAL HIGH (ref 4.0–10.5)
nRBC: 0 % (ref 0.0–0.2)

## 2021-11-22 LAB — COMPREHENSIVE METABOLIC PANEL
ALT: 24 U/L (ref 0–44)
AST: 29 U/L (ref 15–41)
Albumin: 3.8 g/dL (ref 3.5–5.0)
Alkaline Phosphatase: 91 U/L (ref 38–126)
Anion gap: 10 (ref 5–15)
BUN: 26 mg/dL — ABNORMAL HIGH (ref 8–23)
CO2: 24 mmol/L (ref 22–32)
Calcium: 8.8 mg/dL — ABNORMAL LOW (ref 8.9–10.3)
Chloride: 102 mmol/L (ref 98–111)
Creatinine, Ser: 0.84 mg/dL (ref 0.44–1.00)
GFR, Estimated: >60 mL/min (ref >60)
Glucose, Bld: 121 mg/dL — ABNORMAL HIGH (ref 70–99)
Potassium: 3.7 mmol/L (ref 3.5–5.1)
Sodium: 136 mmol/L (ref 135–145)
Total Bilirubin: 1.1 mg/dL (ref 0.3–1.2)
Total Protein: 6.4 g/dL — ABNORMAL LOW (ref 6.5–8.1)

## 2021-11-22 LAB — PROTIME-INR
INR: 1 (ref 0.8–1.2)
Prothrombin Time: 12.7 seconds (ref 11.4–15.2)

## 2021-11-22 LAB — LACTIC ACID, PLASMA: Lactic Acid, Venous: 1.2 mmol/L (ref 0.5–1.9)

## 2021-11-22 LAB — RESP PANEL BY RT-PCR (FLU A&B, COVID) ARPGX2
Influenza A by PCR: NEGATIVE
Influenza B by PCR: NEGATIVE
SARS Coronavirus 2 by RT PCR: NEGATIVE

## 2021-11-22 LAB — LIPASE, BLOOD: Lipase: 59 U/L — ABNORMAL HIGH (ref 11–51)

## 2021-11-22 IMAGING — CT CT ABD-PELV W/ CM
2 of 5 series · 15 of 46 positions shown, 17 images · IV contrast (Omnipaque)
Comparison: No priors.

CLINICAL DATA: 87-year-old female with history of right lower
quadrant abdominal pain. Suspected acute appendicitis.

EXAM:
CT ABDOMEN AND PELVIS WITH CONTRAST
TECHNIQUE: Multidetector CT imaging of the abdomen and pelvis was performed
using the standard protocol following bolus administration of
intravenous contrast.
CONTRAST:  100mL OMNIPAQUE IOHEXOL 300 MG/ML  SOLN

[Series 2: axial st · axial · 0.81mm/px · z∈[-402,-42]mm · 12 of 82 slices shown, 14 images]
[im 5/82  soft-tissue]
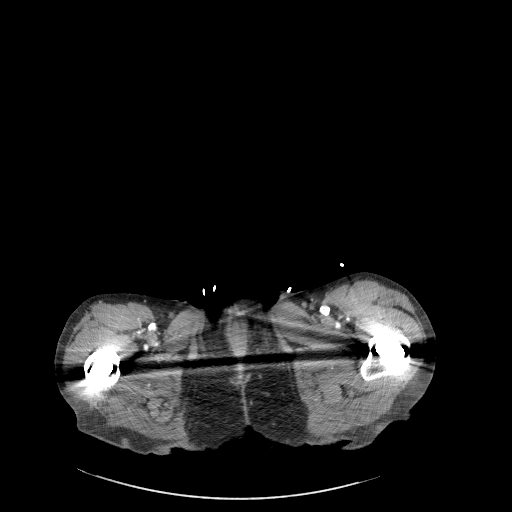
[im 5/82  bone]
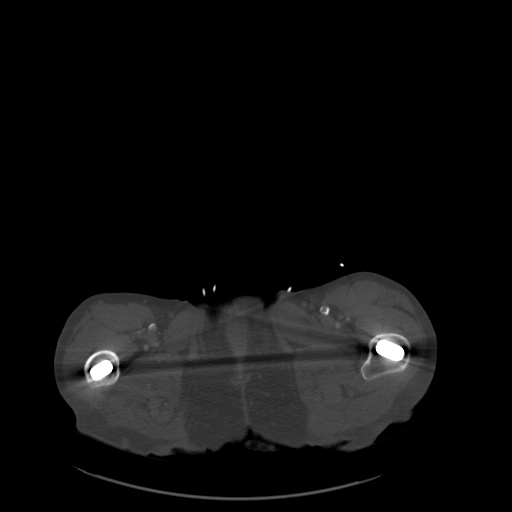
[im 18/82  soft-tissue]
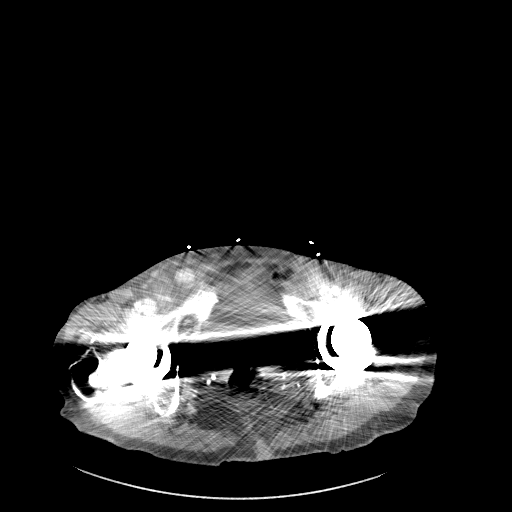
[im 22/82  soft-tissue]
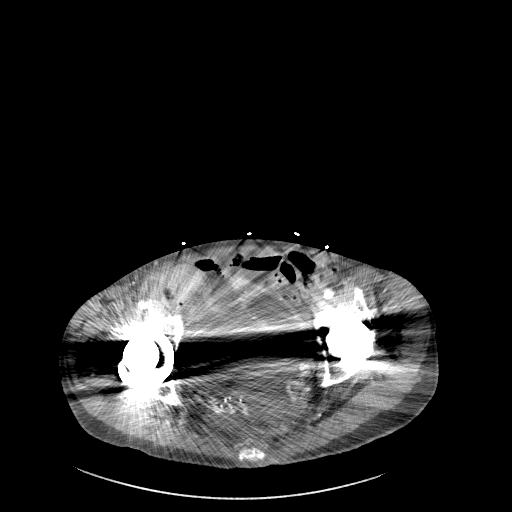
[im 26/82  soft-tissue]
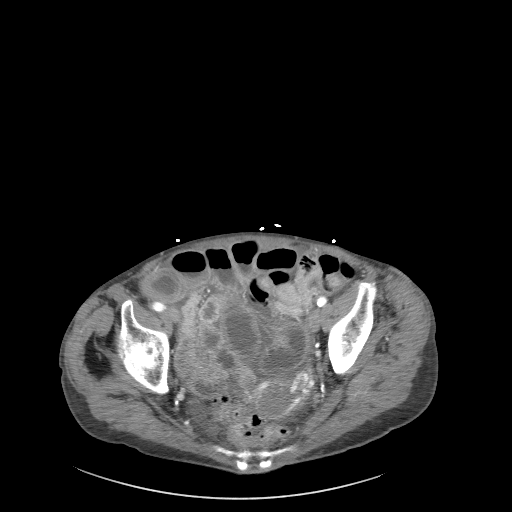
[im 35/82  soft-tissue]
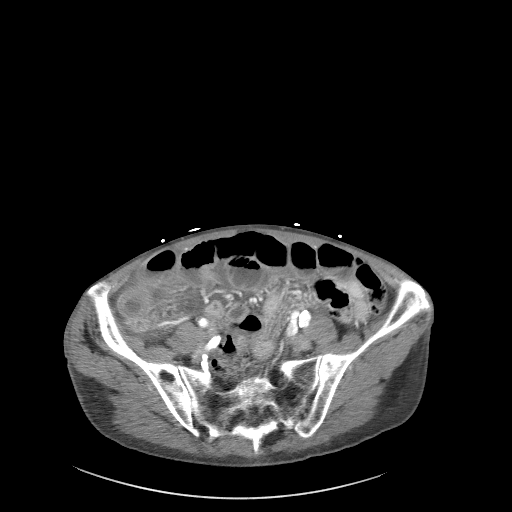
[im 39/82  soft-tissue]
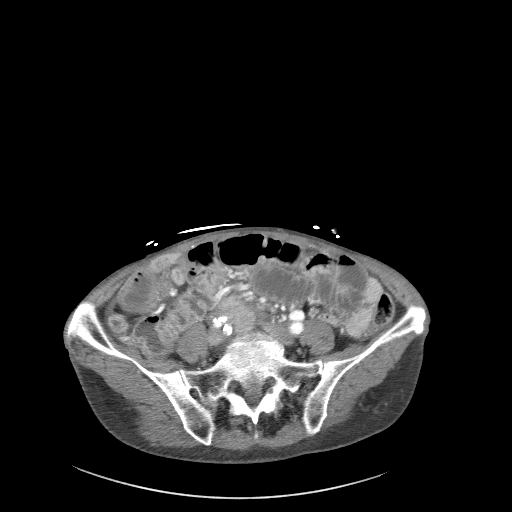
[im 47/82  soft-tissue]
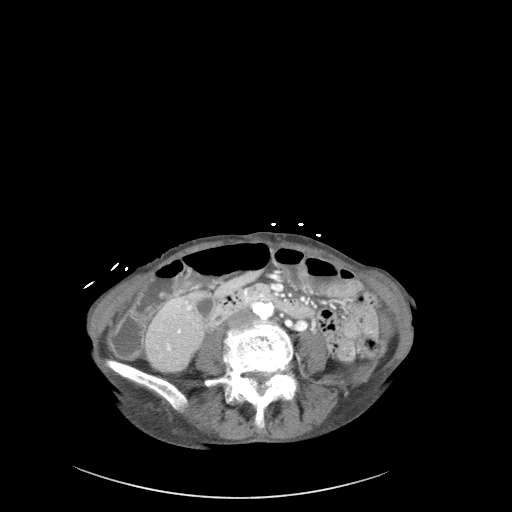
[im 52/82  soft-tissue]
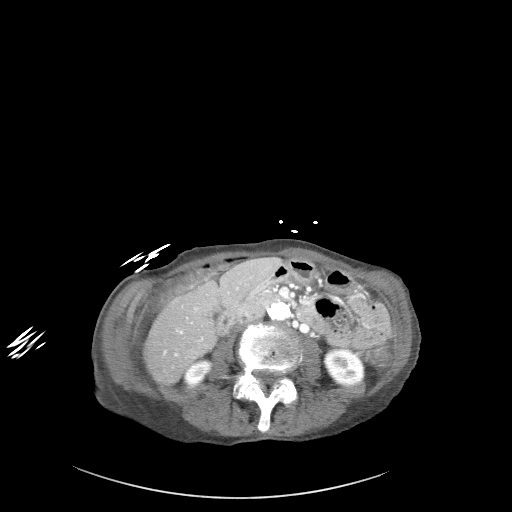
[im 60/82  soft-tissue]
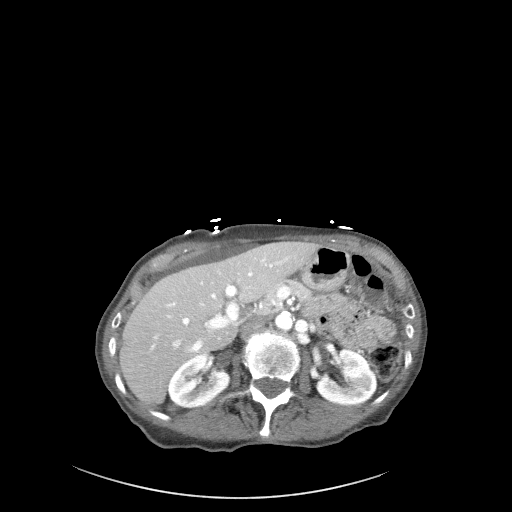
[im 60/82  bone]
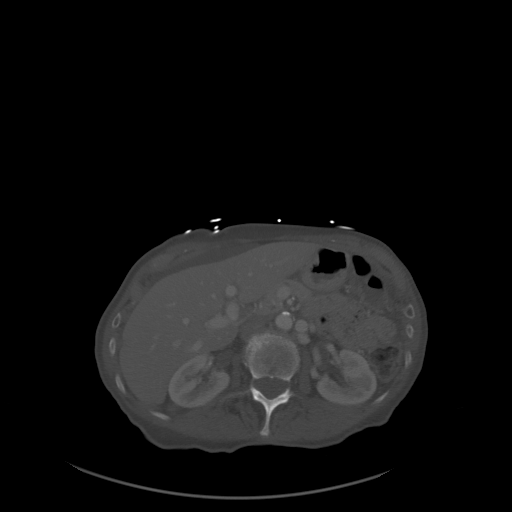
[im 64/82  soft-tissue]
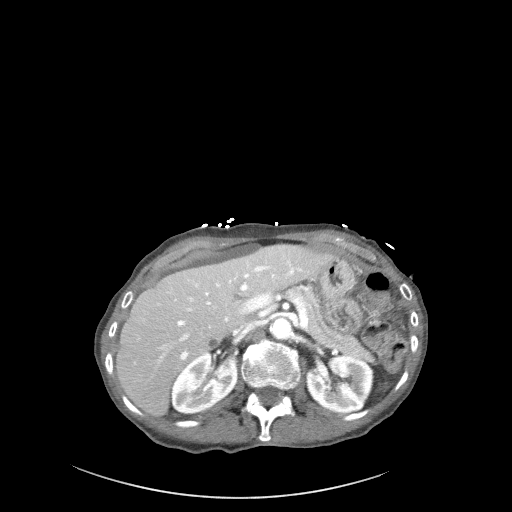
[im 69/82  soft-tissue]
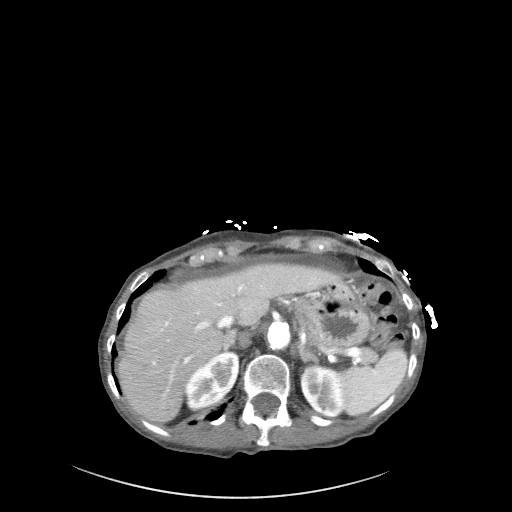
[im 77/82  soft-tissue]
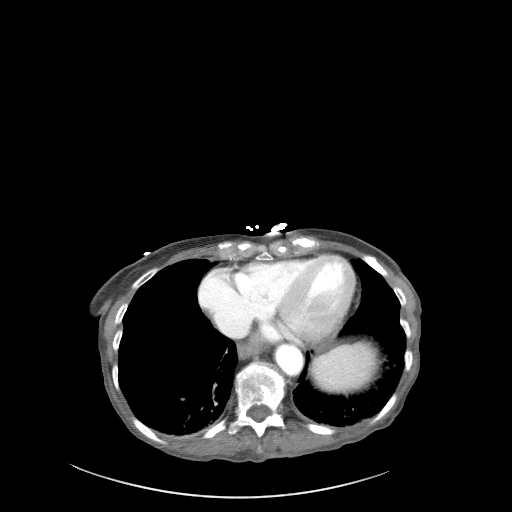

[Series 7: coronal st · coronal · 0.68mm/px · 3 of 70 slices shown]
[im 24/70  soft-tissue]
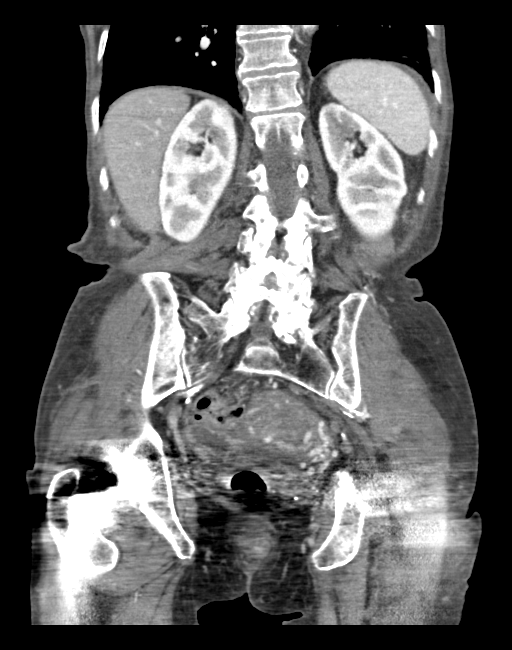
[im 31/70  soft-tissue]
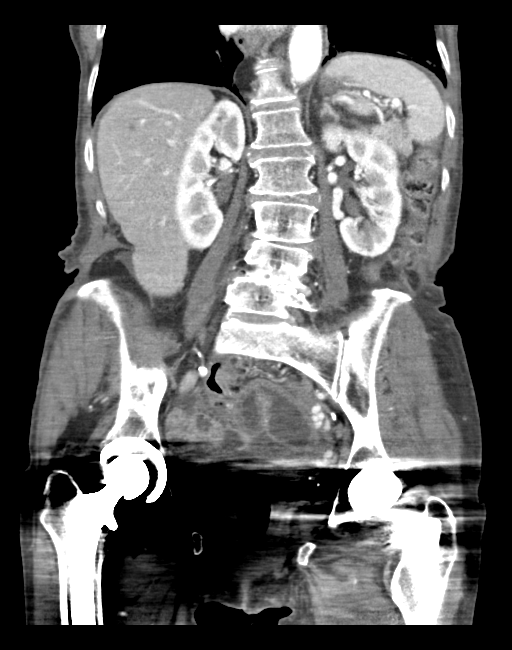
[im 39/70  soft-tissue]
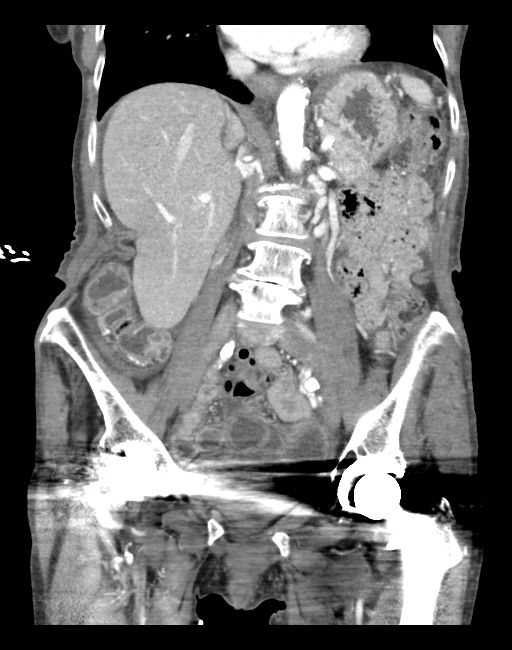

[15 of 46 positions shown; findings below may reference images not displayed]

FINDINGS: Lower chest: Mild scarring in the lung bases bilaterally.
Atherosclerotic calcifications in the descending thoracic aorta.

Hepatobiliary: Subcentimeter low-attenuation lesions in the liver
are too small to characterize, but statistically likely to represent
tiny cysts. No larger more suspicious appearing cystic or solid
hepatic lesions. No intra or extrahepatic biliary ductal dilatation.
Gallbladder is normal in appearance.

Pancreas: No pancreatic mass. No pancreatic ductal dilatation. No
pancreatic or peripancreatic fluid collections or inflammatory
changes.

Spleen: Unremarkable.

Adrenals/Urinary Tract: Bilateral kidneys and adrenal glands are
normal in appearance. No hydroureteronephrosis. Urinary bladder is
completely obscured by beam hardening artifact from the patient's
bilateral hip arthroplasties.

Stomach/Bowel: The appearance of the stomach is normal. There are
several mildly dilated loops of small bowel with air-fluid levels.
In the right lower quadrant of the abdomen there is some distal
small bowel which demonstrates some mural thickening and dilatation,
extending into a right inguinal hernia. Within the right inguinal
hernia there is a short segment of small bowel which appears
markedly thickened with enhancement of the wall. Below this in the
inferior aspect of the inguinal hernia, there is intermediate
attenuation fluid without definite mural enhancement (whether this
represents ascites or unenhanced bowel loop is uncertain because of
extensive beam hardening artifact from the patient's bilateral hip
arthroplasties). Beyond this, the distal and terminal ileum appear
relatively decompressed. Numerous colonic diverticulae are noted,
without definite surrounding inflammatory changes to suggest an
acute diverticulitis at this time. Appendix is not confidently
identified.

Vascular/Lymphatic: Aortic atherosclerosis, without evidence of
aneurysm or dissection in the abdominal or pelvic vasculature. No
lymphadenopathy noted in the abdomen or pelvis.

Reproductive: Uterus and ovaries are atrophic.

Other: Small volume of ascites.  No pneumoperitoneum.

Musculoskeletal: Status post bilateral hip arthroplasty. There are
no aggressive appearing lytic or blastic lesions noted in the
visualized portions of the skeleton.
IMPRESSION: 1. Small-bowel obstruction related to probable incarcerated right
inguinal hernia. Surgical consultation is strongly recommended.
2. Within the right inguinal hernia there is some intermediate
attenuation fluid which could represent ascites, or could be within
a nonenhancing portion of small bowel, which could suggest acute
bowel ischemia.
3. Small volume of ascites.
4. Aortic atherosclerosis.
5. Additional incidental findings, as above.

Critical Value/emergent results were called by telephone at the time
of interpretation on [DATE] at [DATE] to provider EMMANUEL KWAKU
EMMANUEL KWAKU, who verbally acknowledged these results.

## 2021-11-22 SURGERY — REPAIR, HERNIA, INGUINAL, ADULT
Anesthesia: General | Site: Abdomen | Laterality: Right

## 2021-11-22 MED ORDER — DIPHENHYDRAMINE HCL 50 MG/ML IJ SOLN: 12.5000 mg | Freq: Four times a day (QID) | INTRAMUSCULAR | Status: DC | PRN

## 2021-11-22 MED ORDER — OXYCODONE HCL 5 MG/5ML PO SOLN: 5.0000 mg | Freq: Once | ORAL | Status: DC | PRN

## 2021-11-22 MED ORDER — LIDOCAINE 2% (20 MG/ML) 5 ML SYRINGE
INTRAMUSCULAR | Status: DC | PRN
  Administered 2021-11-22: 40 mg via INTRAVENOUS

## 2021-11-22 MED ORDER — OXYCODONE HCL 5 MG PO TABS: 5.0000 mg | ORAL_TABLET | Freq: Once | ORAL | Status: DC | PRN

## 2021-11-22 MED ORDER — ROCURONIUM BROMIDE 10 MG/ML (PF) SYRINGE
PREFILLED_SYRINGE | INTRAVENOUS | Status: AC
  Filled 2021-11-22: qty 10

## 2021-11-22 MED ORDER — LIDOCAINE HCL (PF) 2 % IJ SOLN
INTRAMUSCULAR | Status: AC
  Filled 2021-11-22: qty 5

## 2021-11-22 MED ORDER — AMISULPRIDE (ANTIEMETIC) 5 MG/2ML IV SOLN
10.0000 mg | Freq: Once | INTRAVENOUS | Status: DC | PRN
  Filled 2021-11-22: qty 4

## 2021-11-22 MED ORDER — FENTANYL CITRATE PF 50 MCG/ML IJ SOSY
25.0000 ug | PREFILLED_SYRINGE | INTRAMUSCULAR | Status: DC | PRN
Start: 2021-11-22 — End: 2021-11-23

## 2021-11-22 MED ORDER — ONDANSETRON HCL 4 MG/2ML IJ SOLN: 4.0000 mg | Freq: Four times a day (QID) | INTRAMUSCULAR | Status: DC | PRN

## 2021-11-22 MED ORDER — KCL IN DEXTROSE-NACL 20-5-0.45 MEQ/L-%-% IV SOLN
INTRAVENOUS | Status: DC
  Filled 2021-11-22: qty 1000

## 2021-11-22 MED ORDER — LISINOPRIL 10 MG PO TABS
10.0000 mg | ORAL_TABLET | Freq: Every day | ORAL | Status: DC
  Administered 2021-11-24: 10 mg via ORAL
  Filled 2021-11-22 (×3): qty 1

## 2021-11-22 MED ORDER — PROPOFOL 10 MG/ML IV BOLUS
INTRAVENOUS | Status: DC | PRN
  Administered 2021-11-22: 20 mg via INTRAVENOUS
  Administered 2021-11-22: 40 mg via INTRAVENOUS
  Administered 2021-11-22: 20 mg via INTRAVENOUS

## 2021-11-22 MED ORDER — PANTOPRAZOLE SODIUM 40 MG IV SOLR: 40.0000 mg | Freq: Every day | INTRAVENOUS | Status: DC

## 2021-11-22 MED ORDER — LEVETIRACETAM 500 MG PO TABS
500.0000 mg | ORAL_TABLET | Freq: Two times a day (BID) | ORAL | Status: DC
  Administered 2021-11-23 – 2021-11-24 (×3): 500 mg via ORAL
  Filled 2021-11-22 (×4): qty 1

## 2021-11-22 MED ORDER — BUPIVACAINE-EPINEPHRINE (PF) 0.25% -1:200000 IJ SOLN
INTRAMUSCULAR | Status: AC
  Filled 2021-11-22: qty 30

## 2021-11-22 MED ORDER — SODIUM CHLORIDE 0.9 % IV BOLUS
1000.0000 mL | Freq: Once | INTRAVENOUS | Status: AC
  Administered 2021-11-22: 1000 mL via INTRAVENOUS

## 2021-11-22 MED ORDER — SODIUM CHLORIDE 0.9 % IV SOLN: INTRAVENOUS | Status: DC

## 2021-11-22 MED ORDER — ONDANSETRON HCL 4 MG/2ML IJ SOLN: 4.0000 mg | Freq: Once | INTRAMUSCULAR | Status: DC | PRN

## 2021-11-22 MED ORDER — IOHEXOL 300 MG/ML  SOLN
100.0000 mL | Freq: Once | INTRAMUSCULAR | Status: AC | PRN
  Administered 2021-11-22: 100 mL via INTRAVENOUS

## 2021-11-22 MED ORDER — ENOXAPARIN SODIUM 40 MG/0.4ML IJ SOSY
40.0000 mg | PREFILLED_SYRINGE | INTRAMUSCULAR | Status: DC
  Filled 2021-11-22 (×2): qty 0.4

## 2021-11-22 MED ORDER — CHLORHEXIDINE GLUCONATE 0.12 % MT SOLN
15.0000 mL | Freq: Once | OROMUCOSAL | Status: AC
  Administered 2021-11-22: 15 mL via OROMUCOSAL

## 2021-11-22 MED ORDER — PHENYLEPHRINE 40 MCG/ML (10ML) SYRINGE FOR IV PUSH (FOR BLOOD PRESSURE SUPPORT)
PREFILLED_SYRINGE | INTRAVENOUS | Status: AC
  Filled 2021-11-22: qty 10

## 2021-11-22 MED ORDER — MORPHINE SULFATE (PF) 2 MG/ML IV SOLN: 1.0000 mg | INTRAVENOUS | Status: DC | PRN

## 2021-11-22 MED ORDER — PROPOFOL 10 MG/ML IV BOLUS
INTRAVENOUS | Status: AC
  Filled 2021-11-22: qty 20

## 2021-11-22 MED ORDER — MORPHINE SULFATE (PF) 2 MG/ML IV SOLN
2.0000 mg | INTRAVENOUS | Status: DC | PRN
  Administered 2021-11-23: 2 mg via INTRAVENOUS

## 2021-11-22 MED ORDER — ACETAMINOPHEN 500 MG PO TABS
1000.0000 mg | ORAL_TABLET | Freq: Four times a day (QID) | ORAL | Status: DC
  Administered 2021-11-24 (×2): 1000 mg via ORAL
  Filled 2021-11-22 (×3): qty 2

## 2021-11-22 MED ORDER — ONDANSETRON HCL 4 MG/2ML IJ SOLN
INTRAMUSCULAR | Status: AC
  Filled 2021-11-22: qty 2

## 2021-11-22 MED ORDER — ACETAMINOPHEN 10 MG/ML IV SOLN
INTRAVENOUS | Status: AC
  Filled 2021-11-22: qty 100

## 2021-11-22 MED ORDER — DIPHENHYDRAMINE HCL 12.5 MG/5ML PO ELIX
12.5000 mg | ORAL_SOLUTION | Freq: Four times a day (QID) | ORAL | Status: DC | PRN
  Filled 2021-11-22: qty 5

## 2021-11-22 MED ORDER — ONDANSETRON 4 MG PO TBDP: 4.0000 mg | ORAL_TABLET | Freq: Four times a day (QID) | ORAL | Status: DC | PRN

## 2021-11-22 MED ORDER — TRAMADOL HCL 50 MG PO TABS
50.0000 mg | ORAL_TABLET | Freq: Four times a day (QID) | ORAL | Status: DC | PRN
  Filled 2021-11-22: qty 1

## 2021-11-22 MED ORDER — DEXAMETHASONE SODIUM PHOSPHATE 10 MG/ML IJ SOLN
INTRAMUSCULAR | Status: AC
  Filled 2021-11-22: qty 1

## 2021-11-22 MED ORDER — LACTATED RINGERS IV SOLN: INTRAVENOUS | Status: DC

## 2021-11-22 MED ORDER — FENTANYL CITRATE PF 50 MCG/ML IJ SOSY
50.0000 ug | PREFILLED_SYRINGE | Freq: Once | INTRAMUSCULAR | Status: AC
  Administered 2021-11-22: 50 ug via INTRAVENOUS
  Filled 2021-11-22: qty 1

## 2021-11-22 MED ORDER — ONDANSETRON HCL 4 MG/2ML IJ SOLN
4.0000 mg | Freq: Once | INTRAMUSCULAR | Status: AC
  Administered 2021-11-22: 4 mg via INTRAVENOUS
  Filled 2021-11-22: qty 2

## 2021-11-22 SURGICAL SUPPLY — 8 items
ELECT REM PT RETURN 15FT ADLT (MISCELLANEOUS) ×2 IMPLANT
GLOVE SURG ENC MOIS LTX SZ7.5 (GLOVE) ×2 IMPLANT
GLOVE SURG ENC MOIS LTX SZ8 (GLOVE) ×2 IMPLANT
GLOVE SURG LTX SZ8 (GLOVE) ×2 IMPLANT
GOWN STRL REUS W/TWL XL LVL3 (GOWN DISPOSABLE) ×4 IMPLANT
KIT BASIN OR (CUSTOM PROCEDURE TRAY) ×2 IMPLANT
KIT TURNOVER KIT A (KITS) ×2 IMPLANT
PACK GENERAL/GYN (CUSTOM PROCEDURE TRAY) ×2 IMPLANT

## 2021-11-22 NOTE — ED Notes (Signed)
Pt placed into trendelenburg with ice pack to right groin area.

## 2021-11-22 NOTE — Anesthesia Preprocedure Evaluation (Signed)
Anesthesia Evaluation  Patient identified by MRN, date of birth, ID band Patient awake    Reviewed: Allergy & Precautions, NPO status , Patient's Chart, lab work & pertinent test results  History of Anesthesia Complications Negative for: history of anesthetic complications  Airway Mallampati: II  TM Distance: >3 FB Neck ROM: Full    Dental  (+) Teeth Intact, Dental Advisory Given   Pulmonary neg pulmonary ROS, former smoker,    Pulmonary exam normal        Cardiovascular hypertension, Pt. on medications Normal cardiovascular exam     Neuro/Psych Seizures -,  CVA (L MCA infarct 03/04/20- residual expressive aphasia), Residual Symptoms    GI/Hepatic Neg liver ROS, Incarcerated inguinal hernia   Endo/Other  negative endocrine ROS  Renal/GU negative Renal ROS  negative genitourinary   Musculoskeletal  (+) Arthritis ,   Abdominal   Peds  Hematology Plavix   Anesthesia Other Findings   Reproductive/Obstetrics                             Anesthesia Physical Anesthesia Plan  ASA: 3 and emergent  Anesthesia Plan: General   Post-op Pain Management: Toradol IV (intra-op) and Ofirmev IV (intra-op)   Induction: Intravenous  PONV Risk Score and Plan: 4 or greater and Ondansetron, Dexamethasone, Treatment may vary due to age or medical condition and Midazolam  Airway Management Planned: Oral ETT  Additional Equipment: None  Intra-op Plan:   Post-operative Plan: Extubation in OR  Informed Consent: I have reviewed the patients History and Physical, chart, labs and discussed the procedure including the risks, benefits and alternatives for the proposed anesthesia with the patient or authorized representative who has indicated his/her understanding and acceptance.     Consent reviewed with POA  Plan Discussed with:   Anesthesia Plan Comments:         Anesthesia Quick Evaluation

## 2021-11-22 NOTE — Anesthesia Postprocedure Evaluation (Signed)
Anesthesia Post Note  Patient: Heather Nielsen  Procedure(s) Performed: REDUCTION OF INGUINAL HERNIA (Right: Abdomen)     Patient location during evaluation: PACU Anesthesia Type: General Level of consciousness: awake and alert Pain management: pain level controlled Vital Signs Assessment: post-procedure vital signs reviewed and stable Respiratory status: spontaneous breathing, nonlabored ventilation and respiratory function stable Cardiovascular status: blood pressure returned to baseline and stable Postop Assessment: no apparent nausea or vomiting Anesthetic complications: no   No notable events documented.  Last Vitals:  Vitals:   11/22/21 2045 11/22/21 2100  BP: (!) 173/58 (!) 150/61  Pulse: 71 66  Resp:    Temp:    SpO2: 97% 96%    Last Pain:  Vitals:   11/22/21 2045  TempSrc:   PainSc: 0-No pain                 [name]

## 2021-11-22 NOTE — Progress Notes (Signed)
Pt with R incarcerated hernia.  Given she is on Plavix, and does not have any signs of strangulation, I have recommended an attempt at reduction under anesthesia with hernia repair in a few days once plavix has worn off.  The back up plan with be open inguinal hernia repair if it doesn't reduce.  She and her daughter are aware of the significant bleeding risk if open repair attempted tonight and a small risk of perforated small bowel with attempted reduction.    Vanita Panda, MD  Colorectal and General Surgery [facility]

## 2021-11-22 NOTE — ED Notes (Signed)
Patient transported to CT 

## 2021-11-22 NOTE — Transfer of Care (Signed)
Immediate Anesthesia Transfer of Care Note  Patient: Heather Nielsen  Procedure(s) Performed: REDUCTION OF INGUINAL HERNIA VS HERNIA REPAIR INGUINAL ADULT (Right: Abdomen)  Patient Location: PACU  Anesthesia Type:MAC  Level of Consciousness: awake, alert  and oriented  Airway & Oxygen Therapy: Patient Spontanous Breathing  Post-op Assessment: Report given to RN and Post -op Vital signs reviewed and stable  Post vital signs: Reviewed and stable  Last Vitals:  Vitals Value Taken Time  BP    Temp    Pulse    Resp    SpO2      Last Pain:  Vitals:   11/22/21 1909  TempSrc:   PainSc: 0-No pain         Complications: No notable events documented.

## 2021-11-22 NOTE — Op Note (Signed)
11/22/2021  8:05 PM  PATIENT:  Heather Nielsen  87 y.o. female  Patient Care Team: Isaac Bliss, Rayford Halsted, MD as PCP - General (Internal Medicine)  PRE-OPERATIVE DIAGNOSIS:  INCARCERATED RIGHT INGUINAL HERNIA  POST-OPERATIVE DIAGNOSIS:  RIGHT INGUINAL HERNIA  PROCEDURE:   INGUINAL HERNIA REDUCTION UNDER SEDATION  Surgeon(s): Leighton Ruff, MD  ASSISTANT: none   ANESTHESIA:   IV sedation  EBL:  No intake/output data recorded.  DRAINS: none   SPECIMEN:  No Specimen  DISPOSITION OF SPECIMEN:  N/A  COUNTS:  YES  PLAN OF CARE: Admit to inpatient   PATIENT DISPOSITION:  PACU - hemodynamically stable.  INDICATION: 87 y.o. F on plavix with incarcerated inguinal hernia   OR FINDINGS: Direct inguinal hernia  DESCRIPTION: the patient was identified in the preoperative holding area and taken to the OR where they were laid supine on the operating room table.  MAC anesthesia was induced without difficulty. SCDs were also noted to be in place prior to the initiation of anesthesia.    A surgical timeout was performed indicating the correct patient, procedure, positioning and need for preoperative antibiotics.  Direct firm pressure was used to gently push the hernia back into the abdomen.  Small defect was palpated.  Patient was awakened from anesthesia and sent to the PACU in stable condition.  Patient will  need hernia repair once plavix wears off.    Rosario Adie, MD  Colorectal and [place] Surgery

## 2021-11-22 NOTE — H&P (Signed)
Heather Nielsen is an 87 y.o. female.   Chief Complaint: groin pain HPI: The patient is an 87 year old white female who presents with pain in the right groin for the last 2 days.  She denies any nausea or vomiting.  She denies any abdominal pain.  She has had a palpable lump in the right groin for the last 2 days that is exquisitely tender.  She will not let anybody palpate it.  She underwent a CT scan that did show an incarcerated right inguinal hernia causing a bowel obstruction.  She does have a history of stroke 2 years ago but states that she has no residual deficits.  She does live in an assisted living facility but makes her own decisions.  Past Medical History:  Diagnosis Date   Arthritis    Complication of anesthesia    CVA (cerebral vascular accident) (HCC)    Hypertension    PONV (postoperative nausea and vomiting)    Stroke [facility])    Urinary incontinence     Past Surgical History:  Procedure Laterality Date   KNEE SURGERY  2007   menicus    LOOP RECORDER INSERTION N/A 03/05/2020   Procedure: LOOP RECORDER INSERTION;  Surgeon: Hillis Range, MD;  Location: [facility];  Service: Cardiovascular;  Laterality: N/A;   ROTATOR CUFF REPAIR Right 2006   TOTAL HIP ARTHROPLASTY Right 06/16/2015   Procedure: RIGHT TOTAL HIP ARTHROPLASTY ANTERIOR APPROACH;  Surgeon: Ollen Gross, MD;  Location: [facility];  Service: Orthopedics;  Laterality: Right;   TOTAL HIP ARTHROPLASTY Left 09/06/2016   Procedure: LEFT TOTAL HIP ARTHROPLASTY ANTERIOR APPROACH;  Surgeon: Ollen Gross, MD;  Location: [facility];  Service: Orthopedics;  Laterality: Left;    Family History  Problem Relation Age of Onset   Heart disease Mother        died age 29   CAD Brother 7   Social History:  reports that she quit smoking about 36 years ago. Her smoking use included cigarettes. She has a 6.25 pack-year smoking history. She has never used smokeless tobacco. She reports current alcohol use. She reports that she does  not use drugs.  Allergies:  Allergies  Allergen Reactions   Penicillins Other (See Comments)    Has patient had a PCN reaction causing immediate rash, facial/tongue/throat swelling, SOB or lightheadedness with hypotension:No Has patient had a PCN reaction causing severe rash involving mucus membranes or skin necrosis:No Has patient had a PCN reaction that required hospitalization:No Has patient had a PCN reaction occurring within the last 10 years:No If all of the above answers are "NO", then may proceed with Cephalosporin use. Urinary issues--pt.says "felt like razor blades in her bladder"   Pneumococcal Vaccines Hives, Itching and Rash    (Not in a hospital admission)   Results for orders placed or performed during the hospital encounter of 11/22/21 (from the past 48 hour(s))  Comprehensive metabolic panel     Status: Abnormal   Collection Time: 11/22/21 12:20 PM  Result Value Ref Range   Sodium 136 135 - 145 mmol/L   Potassium 3.7 3.5 - 5.1 mmol/L   Chloride 102 98 - 111 mmol/L   CO2 24 22 - 32 mmol/L   Glucose, Bld 121 (H) 70 - 99 mg/dL    Comment: Glucose reference range applies only to samples taken after fasting for at least 8 hours.   BUN 26 (H) 8 - 23 mg/dL   Creatinine, Ser 3.53 0.44 - 1.00 mg/dL   Calcium  8.8 (L) 8.9 - 10.3 mg/dL   Total Protein 6.4 (L) 6.5 - 8.1 g/dL   Albumin 3.8 3.5 - 5.0 g/dL   AST 29 15 - 41 U/L   ALT 24 0 - 44 U/L   Alkaline Phosphatase 91 38 - 126 U/L   Total Bilirubin 1.1 0.3 - 1.2 mg/dL   GFR, Estimated >21 >30 mL/min    Comment: (NOTE) Calculated using the CKD-EPI Creatinine Equation (2021)    Anion gap 10 5 - 15    Comment: Performed at [facility], [address] [place], [place] [address]  Lactic acid, plasma     Status: None   Collection Time: 11/22/21 12:20 PM  Result Value Ref Range   Lactic Acid, Venous 1.2 0.5 - 1.9 mmol/L    Comment: Performed at [facility], [address] [place], [place]  [address]  Lipase, blood     Status: Abnormal   Collection Time: 11/22/21 12:20 PM  Result Value Ref Range   Lipase 59 (H) 11 - 51 U/L    Comment: Performed at [facility], [address] [place], [place] [address]  CBC with Differential     Status: Abnormal   Collection Time: 11/22/21 12:20 PM  Result Value Ref Range   WBC 14.0 (H) 4.0 - 10.5 K/uL   RBC 4.40 3.87 - 5.11 MIL/uL   Hemoglobin 13.6 12.0 - 15.0 g/dL   HCT 13.2 44.0 - 10.2 %   MCV 91.6 80.0 - 100.0 fL   MCH 30.9 26.0 - 34.0 pg   MCHC 33.7 30.0 - 36.0 g/dL   RDW 72.5 36.6 - 44.0 %   Platelets 321 150 - 400 K/uL   nRBC 0.0 0.0 - 0.2 %   Neutrophils Relative % 84 %   Neutro Abs 11.7 (H) 1.7 - 7.7 K/uL   Lymphocytes Relative 8 %   Lymphs Abs 1.1 0.7 - 4.0 K/uL   Monocytes Relative 8 %   Monocytes Absolute 1.1 (H) 0.1 - 1.0 K/uL   Eosinophils Relative 0 %   Eosinophils Absolute 0.0 0.0 - 0.5 K/uL   Basophils Relative 0 %   Basophils Absolute 0.1 0.0 - 0.1 K/uL   Immature Granulocytes 0 %   Abs Immature Granulocytes 0.05 0.00 - 0.07 K/uL    Comment: Performed at [facility], [address] [place], [place] [address]  Protime-INR     Status: None   Collection Time: 11/22/21 12:20 PM  Result Value Ref Range   Prothrombin Time 12.7 11.4 - 15.2 seconds   INR 1.0 0.8 - 1.2    Comment: (NOTE) INR goal varies based on device and disease states. Performed at [facility], [address] [place], [place] [address]   Resp Panel by RT-PCR (Flu A&B, Covid) Nasopharyngeal Swab     Status: None   Collection Time: 11/22/21  1:34 PM   Specimen: Nasopharyngeal Swab; Nasopharyngeal(NP) swabs in vial transport medium  Result Value Ref Range   SARS Coronavirus 2 by RT PCR NEGATIVE NEGATIVE    Comment: (NOTE) SARS-CoV-2 target nucleic acids are NOT DETECTED.  The SARS-CoV-2 RNA is generally detectable in upper respiratory specimens during the acute phase of infection. The lowest concentration of  SARS-CoV-2 viral copies this assay can detect is 138 copies/mL. A negative result does not preclude SARS-Cov-2 infection and should not be used as the sole basis for treatment or other patient management  decisions. A negative result may occur with  improper specimen collection/handling, submission of specimen other than nasopharyngeal swab, presence of viral mutation(s) within the areas targeted by this assay, and inadequate number of viral copies(<138 copies/mL). A negative result must be combined with clinical observations, patient history, and epidemiological information. The expected result is Negative.  Fact Sheet for Patients:  BloggerCourse.com  Fact Sheet for Healthcare Providers:  SeriousBroker.it  This test is no t yet approved or cleared by the [place] FDA and  has been authorized for detection and/or diagnosis of SARS-CoV-2 by FDA under an Emergency Use Authorization (EUA). This EUA will remain  in effect (meaning this test can be used) for the duration of the COVID-19 declaration under Section 564(b)(1) of the Act, 21 U.S.C.section 360bbb-3(b)(1), unless the authorization is terminated  or revoked sooner.       Influenza A by PCR NEGATIVE NEGATIVE   Influenza B by PCR NEGATIVE NEGATIVE    Comment: (NOTE) The Xpert Xpress SARS-CoV-2/FLU/RSV plus assay is intended as an aid in the diagnosis of influenza from Nasopharyngeal swab specimens and should not be used as a sole basis for treatment. Nasal washings and aspirates are unacceptable for Xpert Xpress SARS-CoV-2/FLU/RSV testing.  Fact Sheet for Patients: BloggerCourse.com  Fact Sheet for Healthcare Providers: SeriousBroker.it  This test is not yet approved or cleared by the [place] FDA and has been authorized for detection and/or diagnosis of SARS-CoV-2 by FDA under an Emergency Use Authorization (EUA).  This EUA will remain in effect (meaning this test can be used) for the duration of the COVID-19 declaration under Section 564(b)(1) of the Act, 21 U.S.C. section 360bbb-3(b)(1), unless the authorization is terminated or revoked.  Performed at [facility], [address] [place], [place] [address]    CT Head Wo Contrast  Result Date: 11/22/2021 CLINICAL DATA:  Syncope, anticoagulation EXAM: CT HEAD WITHOUT CONTRAST TECHNIQUE: Contiguous axial images were obtained from the base of the skull through the vertex without intravenous contrast. COMPARISON:  11/21/2020 FINDINGS: Brain: No evidence of acute infarction, hemorrhage, hydrocephalus, extra-axial collection or mass lesion/mass effect. Encephalomalacia of the left MCA territory (series 2, image 14, 8). Underlying periventricular and deep white matter hypodensity. Vascular: No hyperdense vessel or unexpected calcification. Skull: Normal. Negative for fracture or focal lesion. Sinuses/Orbits: No acute finding. Other: None. IMPRESSION: 1. No acute intracranial pathology. 2. Encephalomalacia of the left MCA territory in keeping with prior infarction. 3. Small-vessel white matter disease. Electronically Signed   By: Jearld Lesch M.D.   On: 11/22/2021 14:30   CT Abdomen Pelvis W Contrast  Result Date: 11/22/2021 CLINICAL DATA:  87 year old female with history of right lower quadrant abdominal pain. Suspected acute appendicitis. EXAM: CT ABDOMEN AND PELVIS WITH CONTRAST TECHNIQUE: Multidetector CT imaging of the abdomen and pelvis was performed using the standard protocol following bolus administration of intravenous contrast. CONTRAST:  OMNIPAQUE IOHEXOL 300 MG/ML  SOLN COMPARISON:  No priors. FINDINGS: Lower chest: Mild scarring in the lung bases bilaterally. Atherosclerotic calcifications in the descending thoracic aorta. Hepatobiliary: Subcentimeter low-attenuation lesions in the liver are too small to characterize, but statistically  likely to represent tiny cysts. No larger more suspicious appearing cystic or solid hepatic lesions. No intra or extrahepatic biliary ductal dilatation. Gallbladder is normal in appearance. Pancreas: No pancreatic mass. No pancreatic ductal dilatation. No pancreatic or peripancreatic fluid collections or inflammatory changes. Spleen: Unremarkable. Adrenals/Urinary Tract: Bilateral kidneys and adrenal glands are normal in appearance. No hydroureteronephrosis. Urinary bladder  is completely obscured by beam hardening artifact from the patient's bilateral hip arthroplasties. Stomach/Bowel: The appearance of the stomach is normal. There are several mildly dilated loops of small bowel with air-fluid levels. In the right lower quadrant of the abdomen there is some distal small bowel which demonstrates some mural thickening and dilatation, extending into a right inguinal hernia. Within the right inguinal hernia there is a short segment of small bowel which appears markedly thickened with enhancement of the wall. Below this in the inferior aspect of the inguinal hernia, there is intermediate attenuation fluid without definite mural enhancement (whether this represents ascites or unenhanced bowel loop is uncertain because of extensive beam hardening artifact from the patient's bilateral hip arthroplasties). Beyond this, the distal and terminal ileum appear relatively decompressed. Numerous colonic diverticulae are noted, without definite surrounding inflammatory changes to suggest an acute diverticulitis at this time. Appendix is not confidently identified. Vascular/Lymphatic: Aortic atherosclerosis, without evidence of aneurysm or dissection in the abdominal or pelvic vasculature. No lymphadenopathy noted in the abdomen or pelvis. Reproductive: Uterus and ovaries are atrophic. Other: Small volume of ascites.  No pneumoperitoneum. Musculoskeletal: Status post bilateral hip arthroplasty. There are no aggressive appearing lytic  or blastic lesions noted in the visualized portions of the skeleton. IMPRESSION: 1. Small-bowel obstruction related to probable incarcerated right inguinal hernia. Surgical consultation is strongly recommended. 2. Within the right inguinal hernia there is some intermediate attenuation fluid which could represent ascites, or could be within a nonenhancing portion of small bowel, which could suggest acute bowel ischemia. 3. Small volume of ascites. 4. Aortic atherosclerosis. 5. Additional incidental findings, as above. Critical Value/emergent results were called by telephone at the time of interpretation on 11/22/2021 at 2:44 pm to provider [facility], who verbally acknowledged these results. Electronically Signed   By: Trudie Reed M.D.   On: 11/22/2021 14:45    Review of Systems  Constitutional: Negative.   HENT: Negative.    Eyes: Negative.   Respiratory: Negative.    Cardiovascular: Negative.   Gastrointestinal:  Positive for abdominal pain. Negative for nausea and vomiting.  Endocrine: Negative.   Genitourinary: Negative.   Musculoskeletal: Negative.   Skin: Negative.   Allergic/Immunologic: Negative.   Neurological: Negative.   Hematological: Negative.   Psychiatric/Behavioral: Negative.     Blood pressure (!) 139/56, pulse 73, temperature 97.7 F (36.5 C), temperature source Oral, resp. rate (!) 22, weight 41.8 kg, SpO2 98 %. Physical Exam Constitutional:      General: She is not in acute distress.    Appearance: Normal appearance.  HENT:     Head: Normocephalic and atraumatic.     Right Ear: External ear normal.     Left Ear: External ear normal.     Nose: Nose normal.     Mouth/Throat:     Mouth: Mucous membranes are dry.     Pharynx: Oropharynx is clear.  Eyes:     General: No scleral icterus.    Extraocular Movements: Extraocular movements intact.     Conjunctiva/sclera: Conjunctivae normal.     Pupils: Pupils are equal, round, and reactive to light.   Cardiovascular:     Rate and Rhythm: Normal rate and regular rhythm.  Pulmonary:     Effort: Pulmonary effort is normal. No respiratory distress.     Breath sounds: Normal breath sounds.  Abdominal:     General: Abdomen is flat.     Palpations: Abdomen is soft.     Tenderness: There is no abdominal tenderness.  Genitourinary:    Comments: There is a small firm nonreducible right inguinal hernia that is exquisitely tender to palpation Musculoskeletal:        General: No swelling. Normal range of motion.     Cervical back: Normal range of motion and neck supple.  Skin:    General: Skin is warm and dry.     Coloration: Skin is not jaundiced.  Neurological:     General: No focal deficit present.     Mental Status: She is alert and oriented to person, place, and time.  Psychiatric:        Mood and Affect: Mood normal.        Behavior: Behavior normal.     Assessment/Plan The patient appears to have an incarcerated right inguinal hernia causing a bowel obstruction.  Unfortunately this will need to be addressed in the operating room tonight.  She is at significantly high risk of a complication given her history of stroke and her use of blood thinners.  It is certainly possible that if she relaxes under some general anesthesia that the hernia may be then reducible in which case we could observe her for the next couple days until her blood thinner has worn off and then electively repair the hernia during this hospitalization.  I have discussed with her in detail the risks and benefits of the operation as well as some of the technical aspects including the possibility of using mesh as well as the risk of infection and stroke and heart attack given her underlying comorbidities.  She understands and agrees with proceeding.  I will discuss this with our partner tonight that is on-call and then proceed to surgery this evening  Chevis Pretty III, MD 11/22/2021, 5:17 PM

## 2021-11-22 NOTE — ED Notes (Addendum)
Incontinent episode of urine. Linen changed Pt crying at intervals. Speech garbled at times and difficult to understand. Daughter at bedside.

## 2021-11-22 NOTE — ED Triage Notes (Addendum)
Pt hasnt eaten/drank the past 2 days, d/t nausea. Syncope x 2 yesterday. Also has mass to right pelvis. Also has "a.fib monitor" in chest that is bothering her. Unsure if she hit her head during syncopal episodes, no trauma noted, on blood thinners.   Hx of CVA, residual aphasia at baseline

## 2021-11-22 NOTE — ED Provider Notes (Signed)
[facility] EMERGENCY DEPARTMENT Provider Note   CSN: [id number] Arrival date & time: 11/22/21  1150     History Chief Complaint  Patient presents with   Loss of Consciousness    Heather Nielsen is a 87 y.o. female.  She is brought in by her daughter for evaluation of multiple complaints.  She has had a painful knot in her right groin that is been there for a few days.  She has been nauseous and has not eaten for the last few days.  2 days ago it sounds like she might of had 2 syncopal events unwitnessed.  Recently she has been having some diarrhea and saw her primary care doctor and was recommended that she take Pepto-Bismol.  No fevers cough chest pain abdominal pain.  The history is provided by the patient and a relative.  Loss of Consciousness Episode history:  Multiple Most recent episode:  2 days ago Timing:  Unable to specify Progression:  Resolved Chronicity:  New Witnessed: no   Relieved by:  Lying down Worsened by:  Nothing Ineffective treatments:  None tried Associated symptoms: nausea   Associated symptoms: no chest pain, no difficulty breathing, no fever, no headaches, no shortness of breath and no vomiting   Groin Pain This is a new problem. The current episode started 2 days ago. The problem occurs constantly. The problem has not changed since onset.Pertinent negatives include no chest pain, no abdominal pain, no headaches and no shortness of breath. Exacerbated by: Palpation. Nothing relieves the symptoms. She has tried rest for the symptoms. The treatment provided no relief.      Past Medical History:  Diagnosis Date   Arthritis    Complication of anesthesia    CVA (cerebral vascular accident) ([place])    Hypertension    PONV (postoperative nausea and vomiting)    Stroke [facility])    Urinary incontinence     Patient Active Problem List   Diagnosis Date Noted   Vitamin D deficiency 06/01/2021   Vitamin B12 deficiency 06/01/2021   Acute focal  neurologic deficit with partial resolution 11/21/2020   History of CVA (cerebrovascular accident)    Hypertension    Hyperlipidemia 03/11/2020   Aphasia    Acute ischemic stroke ([place]) 03/03/2020   OA (osteoarthritis) of hip 06/16/2015   Osteoarthritis of right hip 12/03/2014   Routine general medical examination at a health care facility 12/03/2014    Past Surgical History:  Procedure Laterality Date   KNEE SURGERY  2007   menicus    LOOP RECORDER INSERTION N/A 03/05/2020   Procedure: LOOP RECORDER INSERTION;  Surgeon: Thompson Grayer, MD;  Location: [facility];  Service: Cardiovascular;  Laterality: N/A;   ROTATOR CUFF REPAIR Right 2006   TOTAL HIP ARTHROPLASTY Right 06/16/2015   Procedure: RIGHT TOTAL HIP ARTHROPLASTY ANTERIOR APPROACH;  Surgeon: Gaynelle Arabian, MD;  Location: [facility];  Service: Orthopedics;  Laterality: Right;   TOTAL HIP ARTHROPLASTY Left 09/06/2016   Procedure: LEFT TOTAL HIP ARTHROPLASTY ANTERIOR APPROACH;  Surgeon: Gaynelle Arabian, MD;  Location: [facility];  Service: Orthopedics;  Laterality: Left;     OB History   No obstetric history on file.     Family History  Problem Relation Age of Onset   Heart disease Mother        died age 60   CAD Brother 85    Social History   Tobacco Use   Smoking status: Former    Packs/day: 0.25  Years: 25.00    Pack years: 6.25    Types: Cigarettes    Quit date: 12/18/1984    Years since quitting: 36.9   Smokeless tobacco: Never  Substance Use Topics   Alcohol use: Yes    Alcohol/week: 0.0 standard drinks    Comment: gin and tonic and glass of wine daily    Drug use: No    Home Medications Prior to Admission medications   Medication Sig Start Date End Date Taking? Authorizing Provider  atorvastatin (LIPITOR) 80 MG tablet Take 1 tablet (80 mg total) by mouth daily. 11/01/21   Isaac Bliss, Rayford Halsted, MD  clopidogrel (PLAVIX) 75 MG tablet Take 1 tablet (75 mg total) by mouth daily. 11/01/21   Isaac Bliss, Rayford Halsted, MD  levETIRAcetam (KEPPRA) 500 MG tablet Take 1 tablet (500 mg total) by mouth 2 (two) times daily. 11/01/21   Garvin Fila, MD  lisinopril (ZESTRIL) 10 MG tablet Take 1 tablet (10 mg total) by mouth daily. 11/01/21   Isaac Bliss, Rayford Halsted, MD  REPATHA SURECLICK [id number] MG/ML SOAJ INJECT 140 MG INTO THE SKIN EVERY 14 DAYS 11/01/21   Garvin Fila, MD  vitamin B-12 (CYANOCOBALAMIN) 1000 MCG tablet Take 1 tablet (1,000 mcg total) by mouth daily. 06/01/21   Isaac Bliss, Rayford Halsted, MD    Allergies    Penicillins and Pneumococcal vaccines  Review of Systems   Review of Systems  Constitutional:  Negative for fever.  HENT:  Negative for sore throat.   Eyes:  Negative for visual disturbance.  Respiratory:  Negative for shortness of breath.   Cardiovascular:  Positive for syncope. Negative for chest pain.  Gastrointestinal:  Positive for nausea. Negative for abdominal pain and vomiting.  Genitourinary:  Negative for hematuria.  Musculoskeletal:  Negative for neck pain.  Skin:  Positive for wound (Skin tears from fall elbows and knees).  Neurological:  Positive for syncope and speech difficulty (Aphasia from prior stroke -baseline per daughter). Negative for headaches.   Physical Exam Updated Vital Signs BP (!) 119/103 (BP Location: Right Arm)   Pulse 72   Temp 97.7 F (36.5 C) (Oral)   Resp 18   Wt 41.8 kg   SpO2 100%   BMI 16.86 kg/m   Physical Exam Vitals and nursing note reviewed.  Constitutional:      General: She is not in acute distress.    Appearance: Normal appearance. She is well-developed.  HENT:     Head: Normocephalic and atraumatic.  Eyes:     Conjunctiva/sclera: Conjunctivae normal.  Cardiovascular:     Rate and Rhythm: Normal rate and regular rhythm.     Heart sounds: No murmur heard. Pulmonary:     Effort: Pulmonary effort is normal. No respiratory distress.     Breath sounds: Normal breath sounds.  Abdominal:     Palpations:  Abdomen is soft.     Tenderness: There is no abdominal tenderness.     Hernia: A hernia is present. Hernia is present in the right inguinal area.     Comments: There is a tender mass in the lateral part of her right mons at her groin.  Musculoskeletal:        General: Signs of injury present. No swelling. Normal range of motion.     Cervical back: Neck supple.     Comments: She has a few skin tears on her right forearm and elbow and some abrasions on her knees bilaterally  Skin:    General:  Skin is warm and dry.     Capillary Refill: Capillary refill takes less than 2 seconds.  Neurological:     Mental Status: She is alert. Mental status is at baseline.     Comments: She is awake and alert.  She has some dysarthric speech.  Normal strength of upper and lower extremities.  Psychiatric:        Mood and Affect: Mood normal.    ED Results / Procedures / Treatments   Labs (all labs ordered are listed, but only abnormal results are displayed) Labs Reviewed  COMPREHENSIVE METABOLIC PANEL - Abnormal; Notable for the following components:      Result Value   Glucose, Bld 121 (*)    BUN 26 (*)    Calcium 8.8 (*)    Total Protein 6.4 (*)    All other components within normal limits  LIPASE, BLOOD - Abnormal; Notable for the following components:   Lipase 59 (*)    All other components within normal limits  CBC WITH DIFFERENTIAL/PLATELET - Abnormal; Notable for the following components:   WBC 14.0 (*)    Neutro Abs 11.7 (*)    Monocytes Absolute 1.1 (*)    All other components within normal limits  RESP PANEL BY RT-PCR (FLU A&B, COVID) ARPGX2  LACTIC ACID, PLASMA  PROTIME-INR  URINALYSIS, ROUTINE W REFLEX MICROSCOPIC    EKG EKG Interpretation  Date/Time:  Tuesday November 22 2021 12:13:39 EST Ventricular Rate:  77 PR Interval:  165 QRS Duration: 82 QT Interval:  406 QTC Calculation: 460 R Axis:   60 Text Interpretation: Sinus rhythm Multiform ventricular premature complexes  pvcs new from prior 12/21 Confirmed by Meridee Score [phone]) on 11/22/2021 12:18:35 PM  Radiology CT Head Wo Contrast  Result Date: 11/22/2021 CLINICAL DATA:  Syncope, anticoagulation EXAM: CT HEAD WITHOUT CONTRAST TECHNIQUE: Contiguous axial images were obtained from the base of the skull through the vertex without intravenous contrast. COMPARISON:  11/21/2020 FINDINGS: Brain: No evidence of acute infarction, hemorrhage, hydrocephalus, extra-axial collection or mass lesion/mass effect. Encephalomalacia of the left MCA territory (series 2, image 14, 8). Underlying periventricular and deep white matter hypodensity. Vascular: No hyperdense vessel or unexpected calcification. Skull: Normal. Negative for fracture or focal lesion. Sinuses/Orbits: No acute finding. Other: None. IMPRESSION: 1. No acute intracranial pathology. 2. Encephalomalacia of the left MCA territory in keeping with prior infarction. 3. Small-vessel white matter disease. Electronically Signed   By: Jearld Lesch M.D.   On: 11/22/2021 14:30   CT Abdomen Pelvis W Contrast  Result Date: 11/22/2021 CLINICAL DATA:  87 year old female with history of right lower quadrant abdominal pain. Suspected acute appendicitis. EXAM: CT ABDOMEN AND PELVIS WITH CONTRAST TECHNIQUE: Multidetector CT imaging of the abdomen and pelvis was performed using the standard protocol following bolus administration of intravenous contrast. CONTRAST:  OMNIPAQUE IOHEXOL 300 MG/ML  SOLN COMPARISON:  No priors. FINDINGS: Lower chest: Mild scarring in the lung bases bilaterally. Atherosclerotic calcifications in the descending thoracic aorta. Hepatobiliary: Subcentimeter low-attenuation lesions in the liver are too small to characterize, but statistically likely to represent tiny cysts. No larger more suspicious appearing cystic or solid hepatic lesions. No intra or extrahepatic biliary ductal dilatation. Gallbladder is normal in appearance. Pancreas: No pancreatic mass.  No pancreatic ductal dilatation. No pancreatic or peripancreatic fluid collections or inflammatory changes. Spleen: Unremarkable. Adrenals/Urinary Tract: Bilateral kidneys and adrenal glands are normal in appearance. No hydroureteronephrosis. Urinary bladder is completely obscured by beam hardening artifact from the patient's bilateral hip arthroplasties.  Stomach/Bowel: The appearance of the stomach is normal. There are several mildly dilated loops of small bowel with air-fluid levels. In the right lower quadrant of the abdomen there is some distal small bowel which demonstrates some mural thickening and dilatation, extending into a right inguinal hernia. Within the right inguinal hernia there is a short segment of small bowel which appears markedly thickened with enhancement of the wall. Below this in the inferior aspect of the inguinal hernia, there is intermediate attenuation fluid without definite mural enhancement (whether this represents ascites or unenhanced bowel loop is uncertain because of extensive beam hardening artifact from the patient's bilateral hip arthroplasties). Beyond this, the distal and terminal ileum appear relatively decompressed. Numerous colonic diverticulae are noted, without definite surrounding inflammatory changes to suggest an acute diverticulitis at this time. Appendix is not confidently identified. Vascular/Lymphatic: Aortic atherosclerosis, without evidence of aneurysm or dissection in the abdominal or pelvic vasculature. No lymphadenopathy noted in the abdomen or pelvis. Reproductive: Uterus and ovaries are atrophic. Other: Small volume of ascites.  No pneumoperitoneum. Musculoskeletal: Status post bilateral hip arthroplasty. There are no aggressive appearing lytic or blastic lesions noted in the visualized portions of the skeleton. IMPRESSION: 1. Small-bowel obstruction related to probable incarcerated right inguinal hernia. Surgical consultation is strongly recommended. 2.  Within the right inguinal hernia there is some intermediate attenuation fluid which could represent ascites, or could be within a nonenhancing portion of small bowel, which could suggest acute bowel ischemia. 3. Small volume of ascites. 4. Aortic atherosclerosis. 5. Additional incidental findings, as above. Critical Value/emergent results were called by telephone at the time of interpretation on 11/22/2021 at 2:44 pm to provider [facility], who verbally acknowledged these results. Electronically Signed   By: Vinnie Langton M.D.   On: 11/22/2021 14:45    Procedures Hernia reduction  Date/Time: 11/22/2021 5:10 PM Performed by: Hayden Rasmussen, MD Authorized by: Hayden Rasmussen, MD  Consent: Verbal consent obtained. Consent given by: patient and power of attorney Patient understanding: patient states understanding of the procedure being performed Patient consent: the patient's understanding of the procedure matches consent given Patient identity confirmed: verbally with patient Local anesthesia used: no  Anesthesia: Local anesthesia used: no  Sedation: Patient sedated: no  Comments: Attempted reduction x3 with fentanyl without success.      Medications Ordered in ED Medications  sodium chloride 0.9 % bolus 1,000 mL ( Intravenous Stopped 11/22/21 1434)  fentaNYL (SUBLIMAZE) injection 50 mcg (50 mcg Intravenous Given 11/22/21 1303)  ondansetron (ZOFRAN) injection 4 mg (4 mg Intravenous Given 11/22/21 1301)  iohexol (OMNIPAQUE) 300 MG/ML solution 100 mL (100 mLs Intravenous Contrast Given 11/22/21 1353)  fentaNYL (SUBLIMAZE) injection 50 mcg (50 mcg Intravenous Given 11/22/21 1443)    ED Course  I have reviewed the triage vital signs and the nursing notes.  Pertinent labs & imaging results that were available during my care of the patient were reviewed by me and considered in my medical decision making (see chart for details).  Clinical Course as of 11/22/21 1709  Tue Nov 22, 2021  1430 Attempted to reduce hernia at bedside x2 without success.  Received call from radiology that patient has inguinal hernia with possible bowel ischemia. [MB]  1451 Attempted again to reduce and patient could not tolerate. [MB]  [id number] Updated patient and daughter.  Consulted general surgery Dr. Marlou Starks. [MB]  1504 Dr. Marlou Starks is asking for the patient be transferred to the [facility] long ED for evaluation.  They do not  recommend antibiotics at this time.  Discussed with Dr. Zenia Resides ED physician who accepts the patient to the ER. [MB]    Clinical Course User Index [MB] Hayden Rasmussen, MD   MDM Rules/Calculators/A&P                          This patient complains of possible syncopal event, nausea, poor p.o. intake, lower abdominal/groin pain; this involves an extensive number of treatment Options and is a complaint that carries with it a high risk of complications and Morbidity. The differential includes obstruction, gastroenteritis, dehydration, renal failure, incarcerated hernia, colitis, diverticulitis, UTI  I ordered, reviewed and interpreted labs, which included CBC elevated white count, stable hemoglobin, chemistries with mild elevation in glucose and BUN, LFTs normal, lipase mildly elevated nonspecific, COVID and flu negative, lactate not elevated, urinalysis ordered not obtained I ordered medication IV fluids IV pain medication nausea medication I ordered imaging studies which included CT head and CT abdomen and pelvis and I independently    visualized and interpreted imaging which showed no acute findings in head, does have evidence of SBO with probable right inguinal hernia as obstruction point. Additional history obtained from patient's daughter Previous records obtained and reviewed in epic no recent admissions I consulted Dr. Marlou Starks general surgery and discussed lab and imaging findings  Critical Interventions: None  After the interventions stated above, I reevaluated the patient  and found patient to still be symptomatically tender.  She will need to be transferred to [facility] for surgical evaluation.   Final Clinical Impression(s) / ED Diagnoses Final diagnoses:  Incarcerated inguinal hernia    Rx / DC Orders ED Discharge Orders     None        Hayden Rasmussen, MD 11/22/21 1731

## 2021-11-22 NOTE — ED Provider Notes (Signed)
Pt here as a transfer from [facility] to see surgery for an incarcerated hernia.  Pain is not bad unless you touch the hernia.  It is red and swollen. Dr. Carolynne Edouard (surgery) came to the ED and saw pt and will admit and take to the OR tonight.   Jacalyn Lefevre, MD 11/22/21 1729

## 2021-11-23 ENCOUNTER — Inpatient Hospital Stay (HOSPITAL_COMMUNITY): Payer: PPO | Admitting: Anesthesiology

## 2021-11-23 ENCOUNTER — Encounter (HOSPITAL_COMMUNITY): Payer: Self-pay

## 2021-11-23 ENCOUNTER — Encounter (HOSPITAL_COMMUNITY)
Admission: EM | Disposition: A | Discharge status: Nursing Home (Includes ICF) | Payer: Self-pay | Source: Skilled Nursing Facility

## 2021-11-23 ENCOUNTER — Ambulatory Visit: Payer: PPO | Admitting: Family Medicine

## 2021-11-23 HISTORY — PX: INGUINAL HERNIA REPAIR: SHX194

## 2021-11-23 LAB — BASIC METABOLIC PANEL
Anion gap: 9 (ref 5–15)
BUN: 22 mg/dL (ref 8–23)
CO2: 21 mmol/L — ABNORMAL LOW (ref 22–32)
Calcium: 8.2 mg/dL — ABNORMAL LOW (ref 8.9–10.3)
Chloride: 107 mmol/L (ref 98–111)
Creatinine, Ser: 0.48 mg/dL (ref 0.44–1.00)
GFR, Estimated: >60 mL/min (ref >60)
Glucose, Bld: 106 mg/dL — ABNORMAL HIGH (ref 70–99)
Potassium: 3.4 mmol/L — ABNORMAL LOW (ref 3.5–5.1)
Sodium: 137 mmol/L (ref 135–145)

## 2021-11-23 LAB — CBC
HCT: 33.7 % — ABNORMAL LOW (ref 36.0–46.0)
Hemoglobin: 11.3 g/dL — ABNORMAL LOW (ref 12.0–15.0)
MCH: 31.2 pg (ref 26.0–34.0)
MCHC: 33.5 g/dL (ref 30.0–36.0)
MCV: 93.1 fL (ref 80.0–100.0)
Platelets: 233 10*3/uL (ref 150–400)
RBC: 3.62 MIL/uL — ABNORMAL LOW (ref 3.87–5.11)
RDW: 13 % (ref 11.5–15.5)
WBC: 13.9 10*3/uL — ABNORMAL HIGH (ref 4.0–10.5)
nRBC: 0 % (ref 0.0–0.2)

## 2021-11-23 LAB — TYPE AND SCREEN
ABO/RH(D): B POS
Antibody Screen: NEGATIVE

## 2021-11-23 SURGERY — REPAIR, HERNIA, INGUINAL, ADULT
Anesthesia: General | Laterality: Right

## 2021-11-23 MED ORDER — PROPOFOL 10 MG/ML IV BOLUS
INTRAVENOUS | Status: AC
  Filled 2021-11-23: qty 20

## 2021-11-23 MED ORDER — ACETAMINOPHEN 10 MG/ML IV SOLN
INTRAVENOUS | Status: DC | PRN
  Administered 2021-11-23: 1000 mg via INTRAVENOUS

## 2021-11-23 MED ORDER — PHENYLEPHRINE 40 MCG/ML (10ML) SYRINGE FOR IV PUSH (FOR BLOOD PRESSURE SUPPORT)
PREFILLED_SYRINGE | INTRAVENOUS | Status: DC | PRN
  Administered 2021-11-23: 200 ug via INTRAVENOUS

## 2021-11-23 MED ORDER — VANCOMYCIN HCL 500 MG/100ML IV SOLN
500.0000 mg | INTRAVENOUS | Status: DC
  Filled 2021-11-23: qty 100

## 2021-11-23 MED ORDER — BUPIVACAINE-EPINEPHRINE 0.25% -1:200000 IJ SOLN
INTRAMUSCULAR | Status: DC | PRN
  Administered 2021-11-23: 16 mL

## 2021-11-23 MED ORDER — VANCOMYCIN HCL IN DEXTROSE 1-5 GM/200ML-% IV SOLN: 1000.0000 mg | Freq: Once | INTRAVENOUS | Status: AC

## 2021-11-23 MED ORDER — ONDANSETRON HCL 4 MG/2ML IJ SOLN
INTRAMUSCULAR | Status: DC | PRN
  Administered 2021-11-23: 4 mg via INTRAVENOUS

## 2021-11-23 MED ORDER — FENTANYL CITRATE PF 50 MCG/ML IJ SOSY
PREFILLED_SYRINGE | INTRAMUSCULAR | Status: AC
  Filled 2021-11-23: qty 1

## 2021-11-23 MED ORDER — FENTANYL CITRATE (PF) 100 MCG/2ML IJ SOLN
INTRAMUSCULAR | Status: AC
  Filled 2021-11-23: qty 2

## 2021-11-23 MED ORDER — VANCOMYCIN HCL IN DEXTROSE 1-5 GM/200ML-% IV SOLN
INTRAVENOUS | Status: AC
  Administered 2021-11-23: 1000 mg via INTRAVENOUS
  Filled 2021-11-23: qty 200

## 2021-11-23 MED ORDER — ACETAMINOPHEN 500 MG PO TABS
ORAL_TABLET | ORAL | Status: AC
  Administered 2021-11-23: 1000 mg via ORAL
  Filled 2021-11-23: qty 2

## 2021-11-23 MED ORDER — DEXAMETHASONE SODIUM PHOSPHATE 10 MG/ML IJ SOLN
INTRAMUSCULAR | Status: DC | PRN
  Administered 2021-11-23: 5 mg via INTRAVENOUS

## 2021-11-23 MED ORDER — LIDOCAINE 2% (20 MG/ML) 5 ML SYRINGE
INTRAMUSCULAR | Status: DC | PRN
  Administered 2021-11-23: 40 mg via INTRAVENOUS

## 2021-11-23 MED ORDER — LACTATED RINGERS IV SOLN: INTRAVENOUS | Status: DC

## 2021-11-23 MED ORDER — MORPHINE SULFATE (PF) 2 MG/ML IV SOLN
INTRAVENOUS | Status: AC
  Filled 2021-11-23: qty 1

## 2021-11-23 MED ORDER — ROCURONIUM BROMIDE 100 MG/10ML IV SOLN
INTRAVENOUS | Status: DC | PRN
  Administered 2021-11-23: 40 mg via INTRAVENOUS
  Administered 2021-11-23: 10 mg via INTRAVENOUS

## 2021-11-23 MED ORDER — FENTANYL CITRATE PF 50 MCG/ML IJ SOSY
25.0000 ug | PREFILLED_SYRINGE | INTRAMUSCULAR | Status: DC | PRN
  Administered 2021-11-23 (×2): 25 ug via INTRAVENOUS

## 2021-11-23 MED ORDER — KCL IN DEXTROSE-NACL 20-5-0.45 MEQ/L-%-% IV SOLN
INTRAVENOUS | Status: AC
  Filled 2021-11-23: qty 1000

## 2021-11-23 MED ORDER — DEXAMETHASONE SODIUM PHOSPHATE 10 MG/ML IJ SOLN
INTRAMUSCULAR | Status: AC
  Filled 2021-11-23: qty 1

## 2021-11-23 MED ORDER — HYDRALAZINE HCL 20 MG/ML IJ SOLN
5.0000 mg | Freq: Once | INTRAMUSCULAR | Status: AC
  Administered 2021-11-23: 5 mg via INTRAVENOUS

## 2021-11-23 MED ORDER — ONDANSETRON HCL 4 MG/2ML IJ SOLN
INTRAMUSCULAR | Status: AC
  Filled 2021-11-23: qty 2

## 2021-11-23 MED ORDER — ACETAMINOPHEN 10 MG/ML IV SOLN
INTRAVENOUS | Status: AC
  Filled 2021-11-23: qty 100

## 2021-11-23 MED ORDER — LIDOCAINE HCL (PF) 2 % IJ SOLN
INTRAMUSCULAR | Status: AC
  Filled 2021-11-23: qty 5

## 2021-11-23 MED ORDER — FENTANYL CITRATE (PF) 100 MCG/2ML IJ SOLN
INTRAMUSCULAR | Status: DC | PRN
  Administered 2021-11-23: 50 ug via INTRAVENOUS
  Administered 2021-11-23 (×2): 25 ug via INTRAVENOUS

## 2021-11-23 MED ORDER — ROCURONIUM BROMIDE 10 MG/ML (PF) SYRINGE
PREFILLED_SYRINGE | INTRAVENOUS | Status: AC
  Filled 2021-11-23: qty 10

## 2021-11-23 MED ORDER — MORPHINE SULFATE (PF) 2 MG/ML IV SOLN: 1.0000 mg | INTRAVENOUS | Status: DC | PRN

## 2021-11-23 MED ORDER — BUPIVACAINE-EPINEPHRINE (PF) 0.25% -1:200000 IJ SOLN
INTRAMUSCULAR | Status: AC
  Filled 2021-11-23: qty 30

## 2021-11-23 MED ORDER — HYDROMORPHONE HCL 2 MG/ML IJ SOLN
INTRAMUSCULAR | Status: AC
  Filled 2021-11-23: qty 1

## 2021-11-23 MED ORDER — HYDRALAZINE HCL 20 MG/ML IJ SOLN
INTRAMUSCULAR | Status: AC
  Filled 2021-11-23: qty 1

## 2021-11-23 MED ORDER — PROPOFOL 10 MG/ML IV BOLUS
INTRAVENOUS | Status: DC | PRN
  Administered 2021-11-23: 70 mg via INTRAVENOUS

## 2021-11-23 MED ORDER — SUGAMMADEX SODIUM 200 MG/2ML IV SOLN
INTRAVENOUS | Status: DC | PRN
Start: 2021-11-23 — End: 2021-11-23
  Administered 2021-11-23: 100 mg via INTRAVENOUS

## 2021-11-23 MED ORDER — LEVETIRACETAM IN NACL 500 MG/100ML IV SOLN
500.0000 mg | Freq: Once | INTRAVENOUS | Status: AC
  Administered 2021-11-23: 500 mg via INTRAVENOUS
  Filled 2021-11-23 (×2): qty 100

## 2021-11-23 SURGICAL SUPPLY — 42 items
BAG COUNTER SPONGE SURGICOUNT (BAG) IMPLANT
BENZOIN TINCTURE PRP APPL 2/3 (GAUZE/BANDAGES/DRESSINGS) ×2 IMPLANT
BLADE HEX COATED 2.75 (ELECTRODE) ×2 IMPLANT
BLADE SURG 15 STRL LF DISP TIS (BLADE) ×1 IMPLANT
BLADE SURG 15 STRL SS (BLADE) ×1
DECANTER SPIKE VIAL GLASS SM (MISCELLANEOUS) ×1 IMPLANT
DERMABOND ADVANCED (GAUZE/BANDAGES/DRESSINGS)
DERMABOND ADVANCED .7 DNX12 (GAUZE/BANDAGES/DRESSINGS) IMPLANT
DRAIN PENROSE 0.5X18 (DRAIN) ×2 IMPLANT
DRAPE LAPAROSCOPIC ABDOMINAL (DRAPES) ×2 IMPLANT
ELECT REM PT RETURN 15FT ADLT (MISCELLANEOUS) ×2 IMPLANT
GAUZE SPONGE 4X4 12PLY STRL (GAUZE/BANDAGES/DRESSINGS) ×2 IMPLANT
GLOVE SURG ENC MOIS LTX SZ7.5 (GLOVE) ×4 IMPLANT
GLOVE SURG UNDER POLY LF SZ7 (GLOVE) ×2 IMPLANT
GOWN STRL REUS W/ TWL XL LVL3 (GOWN DISPOSABLE) ×1 IMPLANT
GOWN STRL REUS W/TWL LRG LVL3 (GOWN DISPOSABLE) ×3 IMPLANT
GOWN STRL REUS W/TWL XL LVL3 (GOWN DISPOSABLE) ×3 IMPLANT
KIT BASIN OR (CUSTOM PROCEDURE TRAY) ×2 IMPLANT
KIT TURNOVER KIT A (KITS) IMPLANT
MARKER SKIN DUAL TIP RULER LAB (MISCELLANEOUS) ×2 IMPLANT
MESH PHASIX ST 15X20 (Mesh General) ×1 IMPLANT
NDL HYPO 25X1 1.5 SAFETY (NEEDLE) ×1 IMPLANT
NEEDLE HYPO 25X1 1.5 SAFETY (NEEDLE) ×2 IMPLANT
NS IRRIG 1000ML POUR BTL (IV SOLUTION) ×2 IMPLANT
PACK BASIC VI WITH GOWN DISP (CUSTOM PROCEDURE TRAY) ×2 IMPLANT
PENCIL SMOKE EVACUATOR (MISCELLANEOUS) IMPLANT
SPONGE T-LAP 18X18 ~~LOC~~+RFID (SPONGE) ×2 IMPLANT
STRIP CLOSURE SKIN 1/2X4 (GAUZE/BANDAGES/DRESSINGS) ×2 IMPLANT
SUT MNCRL AB 4-0 PS2 18 (SUTURE) ×2 IMPLANT
SUT PROLENE 2 0 SH DA (SUTURE) ×8 IMPLANT
SUT SILK 2 0 SH (SUTURE) IMPLANT
SUT SILK 3 0 (SUTURE)
SUT SILK 3-0 18XBRD TIE 12 (SUTURE) IMPLANT
SUT VIC AB 0 CT1 27 (SUTURE) ×1
SUT VIC AB 0 CT1 27XBRD ANTBC (SUTURE) IMPLANT
SUT VIC AB 2-0 SH 27 (SUTURE) ×2
SUT VIC AB 2-0 SH 27X BRD (SUTURE) ×2 IMPLANT
SUT VIC AB 3-0 SH 27 (SUTURE) ×2
SUT VIC AB 3-0 SH 27XBRD (SUTURE) ×2 IMPLANT
SYR BULB IRRIG 60ML STRL (SYRINGE) ×2 IMPLANT
SYR CONTROL 10ML LL (SYRINGE) ×2 IMPLANT
TOWEL OR 17X26 10 PK STRL BLUE (TOWEL DISPOSABLE) ×2 IMPLANT

## 2021-11-23 NOTE — H&P (View-Only) (Signed)
1 Day Post-Op  Subjective: CC: Patient seen in PACU. Reports improved pain since hernia was reduced in the OR. She was unaware it has recurred. She denies n/v. No flatus or bm. She is unsure when her last dose of plavix was.   Objective: Vital signs in last 24 hours: Temp:  [97.7 F (36.5 C)-98.7 F (37.1 C)] 98.7 F (37.1 C) (12/07 0700) Pulse Rate:  [58-75] 64 (12/07 0700) Resp:  [11-22] 16 (12/07 0700) BP: (115-173)/(46-103) 115/51 (12/07 0700) SpO2:  [94 %-100 %] 97 % (12/07 0700) Weight:  [41.8 kg] 41.8 kg (12/06 1201)    Intake/Output from previous day: 12/06 0701 - 12/07 0700 In: 1003.8 [IV Piggyback:1003.8] Out: -  Intake/Output this shift: No intake/output data recorded.  PE: Gen:  Alert, NAD, pleasant Card:  Reg Pulm:  CTAB, no W/R/R, effort normal Abd: Soft, ND, NT +BS GU: There is a small firm nonreducible right inguinal hernia that is exquisitely tender to palpation with faint blanchable erythema overlying.   Lab Results:  Recent Labs    11/22/21 1220 11/23/21 0601  WBC 14.0* 13.9*  HGB 13.6 11.3*  HCT 40.3 33.7*  PLT 321 233   BMET Recent Labs    11/22/21 1220 11/23/21 0601  NA 136 137  K 3.7 3.4*  CL 102 107  CO2 24 21*  GLUCOSE 121* 106*  BUN 26* 22  CREATININE 0.84 0.48  CALCIUM 8.8* 8.2*   PT/INR Recent Labs    11/22/21 1220  LABPROT 12.7  INR 1.0   CMP     Component Value Date/Time   NA 137 11/23/2021 0601   K 3.4 (L) 11/23/2021 0601   CL 107 11/23/2021 0601   CO2 21 (L) 11/23/2021 0601   GLUCOSE 106 (H) 11/23/2021 0601   BUN 22 11/23/2021 0601   CREATININE 0.48 11/23/2021 0601   CALCIUM 8.2 (L) 11/23/2021 0601   PROT 6.4 (L) 11/22/2021 1220   ALBUMIN 3.8 11/22/2021 1220   AST 29 11/22/2021 1220   ALT 24 11/22/2021 1220   ALKPHOS 91 11/22/2021 1220   BILITOT 1.1 11/22/2021 1220   GFRNONAA >60 11/23/2021 0601   GFRAA >60 03/05/2020 0443   Lipase     Component Value Date/Time   LIPASE 59 (H) 11/22/2021 1220     Studies/Results: CT Head Wo Contrast  Result Date: 11/22/2021 CLINICAL DATA:  Syncope, anticoagulation EXAM: CT HEAD WITHOUT CONTRAST TECHNIQUE: Contiguous axial images were obtained from the base of the skull through the vertex without intravenous contrast. COMPARISON:  11/21/2020 FINDINGS: Brain: No evidence of acute infarction, hemorrhage, hydrocephalus, extra-axial collection or mass lesion/mass effect. Encephalomalacia of the left MCA territory (series 2, image 14, 8). Underlying periventricular and deep white matter hypodensity. Vascular: No hyperdense vessel or unexpected calcification. Skull: Normal. Negative for fracture or focal lesion. Sinuses/Orbits: No acute finding. Other: None. IMPRESSION: 1. No acute intracranial pathology. 2. Encephalomalacia of the left MCA territory in keeping with prior infarction. 3. Small-vessel white matter disease. Electronically Signed   By: Jearld Lesch M.D.   On: 11/22/2021 14:30   CT Abdomen Pelvis W Contrast  Result Date: 11/22/2021 CLINICAL DATA:  87 year old female with history of right lower quadrant abdominal pain. Suspected acute appendicitis. EXAM: CT ABDOMEN AND PELVIS WITH CONTRAST TECHNIQUE: Multidetector CT imaging of the abdomen and pelvis was performed using the standard protocol following bolus administration of intravenous contrast. CONTRAST:  OMNIPAQUE IOHEXOL 300 MG/ML  SOLN COMPARISON:  No priors. FINDINGS: Lower chest: Mild scarring in  the lung bases bilaterally. Atherosclerotic calcifications in the descending thoracic aorta. Hepatobiliary: Subcentimeter low-attenuation lesions in the liver are too small to characterize, but statistically likely to represent tiny cysts. No larger more suspicious appearing cystic or solid hepatic lesions. No intra or extrahepatic biliary ductal dilatation. Gallbladder is normal in appearance. Pancreas: No pancreatic mass. No pancreatic ductal dilatation. No pancreatic or peripancreatic fluid  collections or inflammatory changes. Spleen: Unremarkable. Adrenals/Urinary Tract: Bilateral kidneys and adrenal glands are normal in appearance. No hydroureteronephrosis. Urinary bladder is completely obscured by beam hardening artifact from the patient's bilateral hip arthroplasties. Stomach/Bowel: The appearance of the stomach is normal. There are several mildly dilated loops of small bowel with air-fluid levels. In the right lower quadrant of the abdomen there is some distal small bowel which demonstrates some mural thickening and dilatation, extending into a right inguinal hernia. Within the right inguinal hernia there is a short segment of small bowel which appears markedly thickened with enhancement of the wall. Below this in the inferior aspect of the inguinal hernia, there is intermediate attenuation fluid without definite mural enhancement (whether this represents ascites or unenhanced bowel loop is uncertain because of extensive beam hardening artifact from the patient's bilateral hip arthroplasties). Beyond this, the distal and terminal ileum appear relatively decompressed. Numerous colonic diverticulae are noted, without definite surrounding inflammatory changes to suggest an acute diverticulitis at this time. Appendix is not confidently identified. Vascular/Lymphatic: Aortic atherosclerosis, without evidence of aneurysm or dissection in the abdominal or pelvic vasculature. No lymphadenopathy noted in the abdomen or pelvis. Reproductive: Uterus and ovaries are atrophic. Other: Small volume of ascites.  No pneumoperitoneum. Musculoskeletal: Status post bilateral hip arthroplasty. There are no aggressive appearing lytic or blastic lesions noted in the visualized portions of the skeleton. IMPRESSION: 1. Small-bowel obstruction related to probable incarcerated right inguinal hernia. Surgical consultation is strongly recommended. 2. Within the right inguinal hernia there is some intermediate attenuation  fluid which could represent ascites, or could be within a nonenhancing portion of small bowel, which could suggest acute bowel ischemia. 3. Small volume of ascites. 4. Aortic atherosclerosis. 5. Additional incidental findings, as above. Critical Value/emergent results were called by telephone at the time of interpretation on 11/22/2021 at 2:44 pm to provider [facility], who verbally acknowledged these results. Electronically Signed   By: Trudie Reed M.D.   On: 11/22/2021 14:45    Anti-infectives: Anti-infectives (From admission, onward)    None        Assessment/Plan Incarcerated right inguinal hernia  - CT 12/6 w/ incarcerated R inguinal hernia causing a bowel obstruction - Reduced in OR 12/6 with plan to wait for plavix to wear off before proceeding with repair - Patient with recurrence of R inguinal hernia this AM. I am unable to reduce this at bedside. I have asked RN to place ice over the area and will reattempt to reduce at bedside after ice + pain medication. If unable to be reduced may need repeat reduction in OR to continue to allow Plavix to wear off vs more likely, definitive repair given how quickly it has recurred. Will discuss with MD.  - Start Vanc given overlying skin erythema  FEN - NPO, IVF VTE - SCDs, Lovenox ID - Vanc  Hx CVA w/ residual expressive aphasia per notes - on plavix at home. Currently on hold.  Hx of possible seizures per Neuro notes - on keppra HTN    LOS: 1 day    Jacinto Halim , PA-C [facility] 11/23/2021, 8:10 AM Please see Amion for pager number during day hours 7:00am-4:30pm

## 2021-11-23 NOTE — Anesthesia Procedure Notes (Signed)
Procedure Name: Intubation Date/Time: 11/23/2021 1:50 PM Performed by: Marny Lowenstein, CRNA Pre-anesthesia Checklist: Patient identified, Emergency Drugs available, Suction available and Patient being monitored Patient Re-evaluated:Patient Re-evaluated prior to induction Oxygen Delivery Method: Circle system utilized Preoxygenation: Pre-oxygenation with 100% oxygen Induction Type: IV induction Ventilation: Mask ventilation without difficulty Laryngoscope Size: Miller and 2 Grade View: Grade I Tube type: Oral Tube size: 7.0 mm Number of attempts: 1 Airway Equipment and Method: Patient positioned with wedge pillow and Stylet Placement Confirmation: ETT inserted through vocal cords under direct vision, positive ETCO2 and breath sounds checked- equal and bilateral Secured at: 20 cm Tube secured with: Tape Dental Injury: Teeth and Oropharynx as per pre-operative assessment

## 2021-11-23 NOTE — Progress Notes (Signed)
Pharmacy Antibiotic Note  Heather Nielsen is a 87 y.o. female admitted on 11/22/2021 with R inguinal hernia causing bowel obstruction.  [facility] following and noted overlying skin erythema.  Pharmacy has been consulted for vancomycin dosing.  Plan: Give vancomycin 1000mg  IV x1, followed by Vancomycin 500mg  IV q24 hours (eAUC 510, TBW since < IBW, Scr 0.8) Monitor clinical improvement, ability to switch antibiotics to PO, surgery plans  Weight: 41.8 kg (92 lb 3.2 oz)  Temp (24hrs), Avg:98 F (36.7 C), Min:97.7 F (36.5 C), Max:98.7 F (37.1 C)  Recent Labs  Lab 11/22/21 1220 11/23/21 0601  WBC 14.0* 13.9*  CREATININE 0.84 0.48  LATICACIDVEN 1.2  --     Estimated Creatinine Clearance: 33.9 mL/min (by C-G formula based on SCr of 0.48 mg/dL).    Allergies  Allergen Reactions   Penicillins Other (See Comments)    Has patient had a PCN reaction causing immediate rash, facial/tongue/throat swelling, SOB or lightheadedness with hypotension:No Has patient had a PCN reaction causing severe rash involving mucus membranes or skin necrosis:No Has patient had a PCN reaction that required hospitalization:No Has patient had a PCN reaction occurring within the last 10 years:No If all of the above answers are "NO", then may proceed with Cephalosporin use. Urinary issues--pt.says "felt like razor blades in her bladder"   Pneumococcal Vaccines Hives, Itching and Rash    Antimicrobials this admission: Vancomycin 12/7 >>   Dose adjustments this admission: N/a  Microbiology results: None this admit  Thank you for allowing pharmacy to be a part of this patient's care.  14/07/22, PharmD 11/23/2021 10:39 AM

## 2021-11-23 NOTE — Anesthesia Postprocedure Evaluation (Signed)
Anesthesia Post Note  Patient: [name]  Procedure(s) Performed: HERNIA REPAIR INGUINAL ADULT WITH MESH (Right)     Patient location during evaluation: PACU Anesthesia Type: General Level of consciousness: awake and alert Pain management: pain level controlled Vital Signs Assessment: post-procedure vital signs reviewed and stable Respiratory status: spontaneous breathing, nonlabored ventilation, respiratory function stable and patient connected to nasal cannula oxygen Cardiovascular status: blood pressure returned to baseline and stable Postop Assessment: no apparent nausea or vomiting Anesthetic complications: no   No notable events documented.  Last Vitals:  Vitals:   11/23/21 1703 11/23/21 1844  BP: (!) 151/61 (!) 155/68  Pulse: 72 71  Resp: 17 16  Temp: 36.5 C 36.7 C  SpO2: 95% 99%    Last Pain:  Vitals:   11/23/21 1844  TempSrc: Oral  PainSc:                  Makenna Macaluso L Sean Macwilliams

## 2021-11-23 NOTE — Interval H&P Note (Signed)
History and Physical Interval Note:  11/23/2021 1:11 PM  Heather Nielsen  has presented today for surgery, with the diagnosis of right inguinal hernia.  The various methods of treatment have been discussed with the patient and family. After consideration of risks, benefits and other options for treatment, the patient has consented to  Procedure(s): HERNIA REPAIR INGUINAL ADULT (N/A) as a surgical intervention.  The patient's history has been reviewed, patient examined, no change in status, stable for surgery.  I have reviewed the patient's chart and labs.  Questions were answered to the patient's satisfaction.     Chevis Pretty III

## 2021-11-23 NOTE — Transfer of Care (Signed)
Immediate Anesthesia Transfer of Care Note  Patient: Heather Nielsen  Procedure(s) Performed: HERNIA REPAIR INGUINAL ADULT WITH MESH (Right)  Patient Location: PACU  Anesthesia Type:General  Level of Consciousness: awake  Airway & Oxygen Therapy: Patient Spontanous Breathing and Patient connected to nasal cannula oxygen  Post-op Assessment: Report given to RN and Post -op Vital signs reviewed and stable  Post vital signs: Reviewed and stable  Last Vitals:  Vitals Value Taken Time  BP 162/78 11/23/21 1500  Temp    Pulse 81 11/23/21 1501  Resp 20 11/23/21 1501  SpO2 96 % 11/23/21 1501  Vitals shown include unvalidated device data.  Last Pain:  Vitals:   11/23/21 1200  TempSrc:   PainSc: 0-No pain         Complications: No notable events documented.

## 2021-11-23 NOTE — Progress Notes (Signed)
  1 Day Post-Op  Subjective: CC: Patient seen in PACU. Reports improved pain since hernia was reduced in the OR. She was unaware it has recurred. She denies n/v. No flatus or bm. She is unsure when her last dose of plavix was.   Objective: Vital signs in last 24 hours: Temp:  [97.7 F (36.5 C)-98.7 F (37.1 C)] 98.7 F (37.1 C) (12/07 0700) Pulse Rate:  [58-75] 64 (12/07 0700) Resp:  [11-22] 16 (12/07 0700) BP: (115-173)/(46-103) 115/51 (12/07 0700) SpO2:  [94 %-100 %] 97 % (12/07 0700) Weight:  [41.8 kg] 41.8 kg (12/06 1201)    Intake/Output from previous day: 12/06 0701 - 12/07 0700 In: 1003.8 [IV Piggyback:1003.8] Out: -  Intake/Output this shift: No intake/output data recorded.  PE: Gen:  Alert, NAD, pleasant Card:  Reg Pulm:  CTAB, no W/R/R, effort normal Abd: Soft, ND, NT +BS GU: There is a small firm nonreducible right inguinal hernia that is exquisitely tender to palpation with faint blanchable erythema overlying.   Lab Results:  Recent Labs    11/22/21 1220 11/23/21 0601  WBC 14.0* 13.9*  HGB 13.6 11.3*  HCT 40.3 33.7*  PLT 321 233   BMET Recent Labs    11/22/21 1220 11/23/21 0601  NA 136 137  K 3.7 3.4*  CL 102 107  CO2 24 21*  GLUCOSE 121* 106*  BUN 26* 22  CREATININE 0.84 0.48  CALCIUM 8.8* 8.2*   PT/INR Recent Labs    11/22/21 1220  LABPROT 12.7  INR 1.0   CMP     Component Value Date/Time   NA 137 11/23/2021 0601   K 3.4 (L) 11/23/2021 0601   CL 107 11/23/2021 0601   CO2 21 (L) 11/23/2021 0601   GLUCOSE 106 (H) 11/23/2021 0601   BUN 22 11/23/2021 0601   CREATININE 0.48 11/23/2021 0601   CALCIUM 8.2 (L) 11/23/2021 0601   PROT 6.4 (L) 11/22/2021 1220   ALBUMIN 3.8 11/22/2021 1220   AST 29 11/22/2021 1220   ALT 24 11/22/2021 1220   ALKPHOS 91 11/22/2021 1220   BILITOT 1.1 11/22/2021 1220   GFRNONAA >60 11/23/2021 0601   GFRAA >60 03/05/2020 0443   Lipase     Component Value Date/Time   LIPASE 59 (H) 11/22/2021 1220     Studies/Results: CT Head Wo Contrast  Result Date: 11/22/2021 CLINICAL DATA:  Syncope, anticoagulation EXAM: CT HEAD WITHOUT CONTRAST TECHNIQUE: Contiguous axial images were obtained from the base of the skull through the vertex without intravenous contrast. COMPARISON:  11/21/2020 FINDINGS: Brain: No evidence of acute infarction, hemorrhage, hydrocephalus, extra-axial collection or mass lesion/mass effect. Encephalomalacia of the left MCA territory (series 2, image 14, 8). Underlying periventricular and deep white matter hypodensity. Vascular: No hyperdense vessel or unexpected calcification. Skull: Normal. Negative for fracture or focal lesion. Sinuses/Orbits: No acute finding. Other: None. IMPRESSION: 1. No acute intracranial pathology. 2. Encephalomalacia of the left MCA territory in keeping with prior infarction. 3. Small-vessel white matter disease. Electronically Signed   By: Alex D Bibbey M.D.   On: 11/22/2021 14:30   CT Abdomen Pelvis W Contrast  Result Date: 11/22/2021 CLINICAL DATA:  87-year-old female with history of right lower quadrant abdominal pain. Suspected acute appendicitis. EXAM: CT ABDOMEN AND PELVIS WITH CONTRAST TECHNIQUE: Multidetector CT imaging of the abdomen and pelvis was performed using the standard protocol following bolus administration of intravenous contrast. CONTRAST:  100mL OMNIPAQUE IOHEXOL 300 MG/ML  SOLN COMPARISON:  No priors. FINDINGS: Lower chest: Mild scarring in   the lung bases bilaterally. Atherosclerotic calcifications in the descending thoracic aorta. Hepatobiliary: Subcentimeter low-attenuation lesions in the liver are too small to characterize, but statistically likely to represent tiny cysts. No larger more suspicious appearing cystic or solid hepatic lesions. No intra or extrahepatic biliary ductal dilatation. Gallbladder is normal in appearance. Pancreas: No pancreatic mass. No pancreatic ductal dilatation. No pancreatic or peripancreatic fluid  collections or inflammatory changes. Spleen: Unremarkable. Adrenals/Urinary Tract: Bilateral kidneys and adrenal glands are normal in appearance. No hydroureteronephrosis. Urinary bladder is completely obscured by beam hardening artifact from the patient's bilateral hip arthroplasties. Stomach/Bowel: The appearance of the stomach is normal. There are several mildly dilated loops of small bowel with air-fluid levels. In the right lower quadrant of the abdomen there is some distal small bowel which demonstrates some mural thickening and dilatation, extending into a right inguinal hernia. Within the right inguinal hernia there is a short segment of small bowel which appears markedly thickened with enhancement of the wall. Below this in the inferior aspect of the inguinal hernia, there is intermediate attenuation fluid without definite mural enhancement (whether this represents ascites or unenhanced bowel loop is uncertain because of extensive beam hardening artifact from the patient's bilateral hip arthroplasties). Beyond this, the distal and terminal ileum appear relatively decompressed. Numerous colonic diverticulae are noted, without definite surrounding inflammatory changes to suggest an acute diverticulitis at this time. Appendix is not confidently identified. Vascular/Lymphatic: Aortic atherosclerosis, without evidence of aneurysm or dissection in the abdominal or pelvic vasculature. No lymphadenopathy noted in the abdomen or pelvis. Reproductive: Uterus and ovaries are atrophic. Other: Small volume of ascites.  No pneumoperitoneum. Musculoskeletal: Status post bilateral hip arthroplasty. There are no aggressive appearing lytic or blastic lesions noted in the visualized portions of the skeleton. IMPRESSION: 1. Small-bowel obstruction related to probable incarcerated right inguinal hernia. Surgical consultation is strongly recommended. 2. Within the right inguinal hernia there is some intermediate attenuation  fluid which could represent ascites, or could be within a nonenhancing portion of small bowel, which could suggest acute bowel ischemia. 3. Small volume of ascites. 4. Aortic atherosclerosis. 5. Additional incidental findings, as above. Critical Value/emergent results were called by telephone at the time of interpretation on 11/22/2021 at 2:44 pm to provider Shiv Shuey BUTLER, who verbally acknowledged these results. Electronically Signed   By: Daniel  Entrikin M.D.   On: 11/22/2021 14:45    Anti-infectives: Anti-infectives (From admission, onward)    None        Assessment/Plan Incarcerated right inguinal hernia  - CT 12/6 w/ incarcerated R inguinal hernia causing a bowel obstruction - Reduced in OR 12/6 with plan to wait for plavix to wear off before proceeding with repair - Patient with recurrence of R inguinal hernia this AM. I am unable to reduce this at bedside. I have asked RN to place ice over the area and will reattempt to reduce at bedside after ice + pain medication. If unable to be reduced may need repeat reduction in OR to continue to allow Plavix to wear off vs more likely, definitive repair given how quickly it has recurred. Will discuss with MD.  - Start Vanc given overlying skin erythema  FEN - NPO, IVF VTE - SCDs, Lovenox ID - Vanc  Hx CVA w/ residual expressive aphasia per notes - on plavix at home. Currently on hold.  Hx of possible seizures per Neuro notes - on keppra HTN    LOS: 1 day    Wisdom Rickey M Rakan Soffer , PA-C [facility] 11/23/2021, 8:10 AM Please see Amion for pager number during day hours 7:00am-4:30pm

## 2021-11-23 NOTE — Anesthesia Preprocedure Evaluation (Addendum)
Anesthesia Evaluation  Patient identified by MRN, date of birth, ID band Patient awake    Reviewed: Allergy & Precautions, NPO status   History of Anesthesia Complications (+) PONV  Airway Mallampati: I  TM Distance: >3 FB Neck ROM: Full    Dental  (+) Caps, Dental Advisory Given   Pulmonary former smoker,  11/22/2021 SARS coronavirus NEG   breath sounds clear to auscultation       Cardiovascular hypertension, Pt. on medications (-) angina Rhythm:Regular Rate:Normal  Loop recorder '21 ECHO: EF 60-65%. The LV has normal function, no regional wall motion abnormalities. Grade II DD, mild MR, mild AI   Neuro/Psych Seizures -, Poorly Controlled,  CVA, Residual Symptoms    GI/Hepatic Neg liver ROS, recurrent inguinal hernia   Endo/Other  negative endocrine ROS  Renal/GU negative Renal ROS     Musculoskeletal  (+) Arthritis ,   Abdominal   Peds  Hematology plavix   Anesthesia Other Findings   Reproductive/Obstetrics                           Anesthesia Physical Anesthesia Plan  ASA: 3  Anesthesia Plan: General   Post-op Pain Management: Tylenol PO (pre-op)   Induction:   PONV Risk Score and Plan: 3 and Ondansetron, Dexamethasone and Treatment may vary due to age or medical condition  Airway Management Planned: Oral ETT  Additional Equipment: None  Intra-op Plan:   Post-operative Plan: Extubation in OR  Informed Consent: I have reviewed the patients History and Physical, chart, labs and discussed the procedure including the risks, benefits and alternatives for the proposed anesthesia with the patient or authorized representative who has indicated his/her understanding and acceptance.     Dental advisory given and Consent reviewed with POA  Plan Discussed with: CRNA and Surgeon  Anesthesia Plan Comments: (Discussed with daughter, Enedina Finner, by telephone)       Anesthesia  Quick Evaluation

## 2021-11-23 NOTE — Op Note (Signed)
11/23/2021  2:50 PM  PATIENT:  Heather Nielsen  87 y.o. female  PRE-OPERATIVE DIAGNOSIS:  right inguinal hernia  POST-OPERATIVE DIAGNOSIS:  right inguinal hernia  PROCEDURE:  Procedure(s): HERNIA REPAIR INGUINAL ADULT WITH MESH (Right)  SURGEON:  Surgeon(s) and Role:    * Griselda Miner, MD - Primary  PHYSICIAN ASSISTANT:   ASSISTANTS: Leary Roca, PA   ANESTHESIA:   local and general  EBL:  5 mL   BLOOD ADMINISTERED:none  DRAINS: none   LOCAL MEDICATIONS USED:  MARCAINE     SPECIMEN:  No Specimen  DISPOSITION OF SPECIMEN:  N/A  COUNTS:  YES  TOURNIQUET:  * No tourniquets in log *  DICTATION: .Dragon Dictation  After informed consent was obtained the patient was brought to the operating room and placed in the supine position on the operating table.  After adequate induction of general anesthesia the patient's abdomen and right groin were prepped with ChloraPrep, allowed to dry, and draped in usual sterile manner.  An appropriate timeout was performed.  The right groin was then infiltrated with quarter percent Marcaine.  A small incision was made from the edge of the pubic tubercle towards the anterior superior iliac spine on the right.  The incision was carried through the skin and subcutaneous tissue sharply with electrocautery until the fascia of the external oblique was encountered.  A small bridging vein was clamped with hemostats, divided, and ligated with 3-0 silk ties.  The external bleak fascia was then opened along its fibers towards the apex of the external ring with a 15 blade knife and Metzenbaum scissors.  I was able to then identify a small hernia sac medially next to the pubic tubercle.  The defect in the floor was dissected bluntly with a hemostat until the hernia sac was able to be reduced back beneath the transversalis muscle.  I then chose a small piece of phasic's mesh.  I bluntly created a plane beneath the transversalis muscle.  The phasic's mesh was  then placed beneath the transversalis muscle into the space.  The mesh was sewed to the edge of the hernia defect medially with interrupted 2-0 Prolene stitches.  The floor of the canal was then repaired with interrupted 0 Vicryl stitches.  Once this was accomplished the hernia seem well repaired and the mesh was in good position.  The area was examined and found to be hemostatic.  The ilioinguinal nerve was then appropriate position and was left alone.  The wound was then irrigated with copious amounts of saline.  The wound was infiltrated with more quarter percent Marcaine.  The external oblique fascia was then reapproximated with a running 2-0 Vicryl stitch.  The subcutaneous fascia was closed with a running 3-0 Vicryl stitch.  The skin was then closed with a running 4-0 Monocryl subcuticular stitch.  Dermabond dressings were applied.  The patient tolerated the procedure well.  At the end of the case all needle sponge and instrument counts were correct.  The patient was then awakened and taken to recovery in stable condition.  PLAN OF CARE: Admit for overnight observation  PATIENT DISPOSITION:  PACU - hemodynamically stable.   Delay start of Pharmacological VTE agent (>24hrs) due to surgical blood loss or risk of bleeding: not applicable

## 2021-11-24 ENCOUNTER — Encounter (HOSPITAL_COMMUNITY): Payer: Self-pay | Admitting: General Surgery

## 2021-11-24 LAB — BASIC METABOLIC PANEL
Anion gap: 7 (ref 5–15)
BUN: 10 mg/dL (ref 8–23)
CO2: 24 mmol/L (ref 22–32)
Calcium: 8.2 mg/dL — ABNORMAL LOW (ref 8.9–10.3)
Chloride: 102 mmol/L (ref 98–111)
Creatinine, Ser: 0.5 mg/dL (ref 0.44–1.00)
GFR, Estimated: >60 mL/min (ref >60)
Glucose, Bld: 127 mg/dL — ABNORMAL HIGH (ref 70–99)
Potassium: 3.8 mmol/L (ref 3.5–5.1)
Sodium: 133 mmol/L — ABNORMAL LOW (ref 135–145)

## 2021-11-24 LAB — CBC
HCT: 33.9 % — ABNORMAL LOW (ref 36.0–46.0)
Hemoglobin: 11.4 g/dL — ABNORMAL LOW (ref 12.0–15.0)
MCH: 31.3 pg (ref 26.0–34.0)
MCHC: 33.6 g/dL (ref 30.0–36.0)
MCV: 93.1 fL (ref 80.0–100.0)
Platelets: 256 10*3/uL (ref 150–400)
RBC: 3.64 MIL/uL — ABNORMAL LOW (ref 3.87–5.11)
RDW: 12.8 % (ref 11.5–15.5)
WBC: 13.4 10*3/uL — ABNORMAL HIGH (ref 4.0–10.5)
nRBC: 0 % (ref 0.0–0.2)

## 2021-11-24 MED ORDER — ENOXAPARIN SODIUM 30 MG/0.3ML IJ SOSY
30.0000 mg | PREFILLED_SYRINGE | INTRAMUSCULAR | Status: DC
  Administered 2021-11-24: 30 mg via SUBCUTANEOUS
  Filled 2021-11-24: qty 0.3

## 2021-11-24 MED ORDER — ACETAMINOPHEN 500 MG PO TABS: 1000.0000 mg | ORAL_TABLET | Freq: Four times a day (QID) | ORAL | 0 refills | Status: DC | PRN

## 2021-11-24 NOTE — Progress Notes (Signed)
Pharmacy: Lovenox dose adjustment  LMWH 40> 30 for weight 41.8 kg  Herby Abraham, Pharm.D 11/24/2021 7:21 AM

## 2021-11-24 NOTE — Discharge Instructions (Signed)
[facility]      [facility] [phone]  OPEN ABDOMINAL SURGERY: POST OP INSTRUCTIONS  Always review your discharge instruction sheet given to you by the facility where your surgery was performed.  IF YOU HAVE DISABILITY OR FAMILY LEAVE FORMS, YOU MUST BRING THEM TO THE OFFICE FOR PROCESSING.  PLEASE DO NOT GIVE THEM TO YOUR DOCTOR.  A prescription for pain medication may be given to you upon discharge.  Take your pain medication as prescribed, if needed.  If narcotic pain medicine is not needed, then you may take acetaminophen (Tylenol) or ibuprofen (Advil) as needed. Take your usually prescribed medications unless otherwise directed. If you need a refill on your pain medication, please contact your pharmacy. They will contact our office to request authorization.  Prescriptions will not be filled after 5pm or on week-ends. You should follow a light diet the first few days after arrival home, such as soup and crackers, pudding, etc.unless your doctor has advised otherwise. A high-fiber, low fat diet can be resumed as tolerated.   Be sure to include lots of fluids daily. Most patients will experience some swelling and bruising on the chest and neck area.  Ice packs will help.  Swelling and bruising can take several days to resolve Most patients will experience some swelling and bruising in the area of the incision. Ice pack will help. Swelling and bruising can take several days to resolve..  It is common to experience some constipation if taking pain medication after surgery.  Increasing fluid intake and taking a stool softener will usually help or prevent this problem from occurring.  A mild laxative (Milk of Magnesia or Miralax) should be taken according to package directions if there are no bowel movements after 48 hours.  You may have steri-strips (small skin tapes) in place directly over the incision.  These strips should be left on the skin for 7-10 days.  If your surgeon used skin  glue on the incision, you may shower in 24 hours.  The glue will flake off over the next 2-3 weeks.  Any sutures or staples will be removed at the office during your follow-up visit. You may find that a light gauze bandage over your incision may keep your staples from being rubbed or pulled. You may shower and replace the bandage daily. ACTIVITIES:  You may resume regular (light) daily activities beginning the next day--such as daily self-care, walking, climbing stairs--gradually increasing activities as tolerated.  You may have sexual intercourse when it is comfortable.  Refrain from any heavy lifting or straining until approved by your doctor. You may drive when you no longer are taking prescription pain medication, you can comfortably wear a seatbelt, and you can safely maneuver your car and apply brakes Return to Work: ___________________________________ You should see your doctor in the office for a follow-up appointment approximately two weeks after your surgery.  Make sure that you call for this appointment within a day or two after you arrive home to insure a convenient appointment time. OTHER INSTRUCTIONS:  _____________________________________________________________ _____________________________________________________________  WHEN TO CALL YOUR DOCTOR: Fever over 101.0 Inability to urinate Nausea and/or vomiting Extreme swelling or bruising Continued bleeding from incision. Increased pain, redness, or drainage from the incision. Difficulty swallowing or breathing Muscle cramping or spasms. Numbness or tingling in hands or feet or around lips.  The clinic staff is available to answer your questions during regular business hours.  Please don't hesitate to call and ask to speak to one of   the nurses if you have concerns.  For further questions, please visit [email or web address]  

## 2021-11-24 NOTE — Progress Notes (Signed)
Discharge packet given to pt and daughter. Instructions gone over with pt and daughter. Daughter asking when pt should restart home blood thinner. PA paged and toth paged but pt daughter states she can not wait for them to respond. Daughter states she will call [facility] office for instructions on when to restart home blood thinner. All other questions answered. IV removed.

## 2021-12-05 NOTE — Discharge Summary (Signed)
[facility] Discharge Summary   Patient ID: Heather Nielsen MRN: [id number] DOB/AGE: 1934/01/06 87 y.o.  Admit date: 11/22/2021 Discharge date: 11/24/2021  Admitting Diagnosis: Incarcerated inguinal hernia   Discharge Diagnosis Patient Active Problem List   Diagnosis Date Noted   Right inguinal hernia 11/22/2021   Inguinal hernia of right side with obstruction 11/22/2021   Vitamin D deficiency 06/01/2021   Vitamin B12 deficiency 06/01/2021   Acute focal neurologic deficit with partial resolution 11/21/2020   History of CVA (cerebrovascular accident)    Hypertension    Hyperlipidemia 03/11/2020   Aphasia    Acute ischemic stroke (HCC) 03/03/2020   OA (osteoarthritis) of hip 06/16/2015   Osteoarthritis of right hip 12/03/2014   Routine general medical examination at a health care facility 12/03/2014    Consultants None  Imaging: No results found.  Procedures Romie Levee 11/22/21 - reduction of right inguinal hernia under anesthesia  Dr. Chevis Pretty 11/23/21 - repair of right inguinal hernia with mesh  HPI:  The patient is an 87 year old white female who presents with pain in the right groin for the last 2 days.  She denies any nausea or vomiting.  She denies any abdominal pain.  She has had a palpable lump in the right groin for the last 2 days that is exquisitely tender.  She will not let anybody palpate it.  She underwent a CT scan that did show an incarcerated right inguinal hernia causing a bowel obstruction.  She does have a history of stroke 2 years ago but states that she has no residual deficits.  She does live in an assisted living facility but makes her own decisions.    Hospital Course:  Patient was admitted and taken to the operating room for reduction of her hernia under anesthesia. This hernia was not surgically repaired at this time due to recent administration of plavix. On POD#1 the hernia recurrent and was not reducible so she underwent urgent repair  by Dr. Carolynne Edouard as above. Post-operatively diet was advanced as tolerated. On 11/24/21 vitals were stable, tolerating PO, mobilizing, and felt stable for discharge back to her independent living facility. Follow up as below.  Physical Exam: General:  Alert, NAD, pleasant, comfortable Abd:  Soft, ND, incision c/d/I and appropriately tender   Allergies as of 11/24/2021       Reactions   Penicillins Other (See Comments)   Urinary issues--pt.says "felt like razor blades in her bladder"   Pneumococcal Vaccines Hives, Itching, Rash        Medication List     TAKE these medications    acetaminophen 500 MG tablet Commonly known as: TYLENOL Take 2 tablets (1,000 mg total) by mouth every 6 (six) hours as needed.   atorvastatin 80 MG tablet Commonly known as: LIPITOR Take 1 tablet (80 mg total) by mouth daily.   bismuth subsalicylate 262 MG/15ML suspension Commonly known as: PEPTO BISMOL Take 30 mLs by mouth every 6 (six) hours as needed for diarrhea or loose stools or indigestion.   clopidogrel 75 MG tablet Commonly known as: PLAVIX Take 1 tablet (75 mg total) by mouth daily.   levETIRAcetam 500 MG tablet Commonly known as: KEPPRA Take 1 tablet (500 mg total) by mouth 2 (two) times daily.   lisinopril 10 MG tablet Commonly known as: ZESTRIL Take 1 tablet (10 mg total) by mouth daily.   Repatha SureClick 140 MG/ML Soaj Generic drug: Evolocumab INJECT 140 MG INTO THE SKIN EVERY 14 DAYS What changed: See the  new instructions.   vitamin B-12 1000 MCG tablet Commonly known as: CYANOCOBALAMIN Take 1 tablet (1,000 mcg total) by mouth daily.         Follow-up Information     Griselda Miner, MD. Go on 12/22/2021.   Specialty: General Surgery Why: at 1:40 PM for post-operative follow up. please arrive 30 minutes early at 1:10 PM to get checked in and fill out any necesary paperwork. Contact information: [address] [place] [address] [phone]                  Signed: Hosie Spangle, [facility] 12/05/2021, 3:54 PM

## 2021-12-15 ENCOUNTER — Ambulatory Visit (INDEPENDENT_AMBULATORY_CARE_PROVIDER_SITE_OTHER): Payer: PPO

## 2021-12-15 DIAGNOSIS — I63412 Cerebral infarction due to embolism of left middle cerebral artery: Secondary | ICD-10-CM

## 2021-12-15 LAB — CUP PACEART REMOTE DEVICE CHECK
Date Time Interrogation Session: 20221228230143
Implantable Pulse Generator Implant Date: 20210319

## 2021-12-27 NOTE — Progress Notes (Signed)
[facility] Summary Report / Loop Recorder 

## 2022-01-15 ENCOUNTER — Encounter: Payer: Self-pay | Admitting: Adult Health

## 2022-01-23 ENCOUNTER — Ambulatory Visit (INDEPENDENT_AMBULATORY_CARE_PROVIDER_SITE_OTHER): Payer: PPO

## 2022-01-23 DIAGNOSIS — I63412 Cerebral infarction due to embolism of left middle cerebral artery: Secondary | ICD-10-CM

## 2022-01-23 LAB — CUP PACEART REMOTE DEVICE CHECK
Date Time Interrogation Session: 20230205231242
Implantable Pulse Generator Implant Date: 20210319

## 2022-01-25 NOTE — Progress Notes (Signed)
[facility] Summary Report / Loop Recorder 

## 2022-02-13 ENCOUNTER — Encounter: Payer: Self-pay | Admitting: Physician Assistant

## 2022-02-21 ENCOUNTER — Telehealth: Payer: Self-pay

## 2022-02-21 NOTE — Telephone Encounter (Signed)
Susie states that patient will be moving to SNF & that her care will be transferred there. Declines appt with PCP. ?

## 2022-02-22 ENCOUNTER — Ambulatory Visit: Payer: PPO | Admitting: Physician Assistant

## 2022-02-27 ENCOUNTER — Ambulatory Visit (INDEPENDENT_AMBULATORY_CARE_PROVIDER_SITE_OTHER): Payer: PPO

## 2022-02-27 DIAGNOSIS — I63412 Cerebral infarction due to embolism of left middle cerebral artery: Secondary | ICD-10-CM

## 2022-02-27 LAB — CUP PACEART REMOTE DEVICE CHECK
Date Time Interrogation Session: 20230312230301
Implantable Pulse Generator Implant Date: 20210319

## 2022-03-01 NOTE — Progress Notes (Signed)
? ? ? ?03/06/2022 ?Heather Nielsen ?[id number] ?April 10, 1934 ? ? ?ASSESSMENT AND PLAN:  ? ?Incontinence of feces, unspecified fecal incontinence type ?History of CVA (cerebrovascular accident) ?Likely from 5 kids, recent CVA, most likely component of pelvic floor dysfunction or anal sphincter weakenss/decreased rectal compliance.  ?Can proceed with KUB to evaluate stool burden and rule out overflow incontience.  ?Benfiber to add stool bulk ?Possible trail of bile acid binder or imodium low dose ?Will proceed with anorectal manometry and or pelvic floor PT ? ?moderate protein calorie malnutrition ?Albumin 11/22/2021  3.8  ?BMI body mass index is 16.28 kg/m?Marland Kitchen  ?Secondary to  recent stroke/covid infection ?Count calories, increase protein ? ?Patient Care Team: ?Philip Aspen, Limmie Patricia, MD as PCP - General (Internal Medicine) ? ?HISTORY OF PRESENT ILLNESS: ?88 y.o. female referred by Philip Aspen, Estel*, with a past medical history of hypertension, history of CVA with residual aphasia on Plavix, right inguinal hernia with history of obstruction, B12 deficiency and others listed below presents for evaluation of fecal incontinence.  ? ?11/22/2021 CT abdomen pelvis for right lower quadrant abdominal pain showed small bowel obstruction related to possible incarcerated right inguinal hernia.  Normal pancreas, low-attenuation liver lesions too small to characterize likely tiny cysts, no ductal dilatation, normal gallbladder. ?Sp right inguinal hernia repair.  ? ?01/20/2022 Labs show unremarkable white blood cell count, hemoglobin 12.1, unremarkable glucose, ALT 40, AST 65, alkaline phosphatase 138 ?CKD stage II GFR 86, BUN 16, creatinine 0.53.  Thyroid within normal limits, A1c 7.3, urine with nitrates and leuk oh sites, treated prophylactically for UTI. ?Last A1c does show that patient is diabetic ? ?Has had bowel and bladder incontinence 1 to 1.5 years.  ?Daughter is here with her, Susie.  ?Currently at [facility] after  her stroke 2 years ago.  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  ?Has pending neurology appointment due to bowel and bladder incontinence ? ?Patient is unable to control stool with defecation.  There is lack of awareness of the need to defecate. ?Has BM every other day or 3 days a week.  ?Daughter states when she had COVID was staying with her, had loose stools every where in bed, in bath, gave her imodium and then she did not have a BM for a week.  ?Small daily leakage with periodic large volume loose stools.  ?No fecal urgency.   ?She has had 5 kids, has had urinary incontinence for years, had surgery 30 years ago that did not help much.  ?No gassiness or bloating.  ? ?Rare nocturnal episodes of fecal incontinence, will get up 2 x a night for urination.  ?Denies decreased rectal sensation.   ?No history of prior hemorrhoid, fissure or anorectal surgery, pelvic irradiation. ?No lower back or perineal pain, she has some right sided weakness from stroke.  ? ?Current Medications:  ? ? ?Current Outpatient Medications (Cardiovascular):  ?  atorvastatin (LIPITOR) 80 MG tablet, Take 1 tablet (80 mg total) by mouth daily. ?  lisinopril (ZESTRIL) 10 MG tablet, Take 1 tablet (10 mg total) by mouth daily. ?  REPATHA SURECLICK 140 MG/ML SOAJ, INJECT 140 MG INTO THE SKIN EVERY 14 DAYS (Patient taking differently: Inject 140 mg into the skin every 14 (fourteen) days.) ? ? ? ?Current Outpatient Medications (Hematological):  ?  clopidogrel (PLAVIX) 75 MG tablet, Take 1 tablet (75 mg total) by mouth daily. ? ?Current Outpatient Medications (  Other):  ?  bismuth subsalicylate (PEPTO BISMOL) 262 MG/15ML suspension, Take 30 mLs by mouth every 6 (six) hours as needed for diarrhea or loose stools or indigestion. ?  levETIRAcetam (KEPPRA) 500 MG tablet, Take 1 tablet (500 mg total) by mouth 2 (two) times daily. ? ?Medical History:  ?Past Medical History:  ?Diagnosis  Date  ? Arthritis   ? Complication of anesthesia   ? CVA (cerebral vascular accident) [facility])   ? Hyperlipemia   ? Hypertension   ? PONV (postoperative nausea and vomiting)   ? Stroke [facility])   ? Urinary incontinence   ? Vitamin D deficiency   ? ?Allergies:  ?Allergies  ?Allergen Reactions  ? Penicillins Other (See Comments)  ?  Urinary issues--pt.says "felt like razor blades in her bladder"  ? Pneumococcal Vaccines Hives, Itching and Rash  ?  ? ?Surgical History:  ?She  has a past surgical history that includes Rotator cuff repair (Right, 2006); Knee surgery (2007); Total hip arthroplasty (Right, 06/16/2015); Total hip arthroplasty (Left, 09/06/2016); LOOP RECORDER INSERTION (N/A, 03/05/2020); Inguinal hernia repair (Right, 11/22/2021); and Inguinal hernia repair (Right, 11/23/2021). ?Family History:  ?Her family history includes CAD (age of onset: 44) in her brother; Heart disease in her mother. ?Social History:  ? reports that she quit smoking about 37 years ago. Her smoking use included cigarettes. She has a 6.25 pack-year smoking history. She has never used smokeless tobacco. She reports current alcohol use. She reports that she does not use drugs. ? ?REVIEW OF SYSTEMS  : All other systems reviewed and negative except where noted in the History of Present Illness. ? ? ?PHYSICAL EXAM: ?BP 124/60   Pulse 74   Ht 5\' 2"  (1.575 m)   Wt 89 lb (40.4 kg)   BMI 16.28 kg/m?  ?General:   Pleasant, well developed female in no acute distress ?Head:  Normocephalic and atraumatic. ?Eyes: sclerae anicteric,conjunctive pink  ?Heart:  regular rate and rhythm ?Pulm: Clear anteriorly; no wheezing ?Abdomen:  Soft, Flat AB, skin exam normal, Normal bowel sounds.  no  tenderness . Without guarding and Without rebound, without hepatomegaly. ?Rectal: Normal external rectal exam, small anal skin tag, decreased rectal tone, no internal hemorrhoids appreciated, large cavernous rectum, no masses, non tender, minimal brown stool, loose,  hemoccult negative ?Extremities:  Without edema. ?Msk:  Symmetrical without gross deformities. Peripheral pulses intact.  ?Neurologic:  Alert and  oriented x4;  grossly normal neurologically. ?Skin:   Dry and intact without significant lesions or rashes. ?Psychiatric: Demonstrates good judgement and reason without abnormal affect or behaviors. ? ? ? , PA-C ?3:41 PM ? ? ?

## 2022-03-06 ENCOUNTER — Ambulatory Visit (INDEPENDENT_AMBULATORY_CARE_PROVIDER_SITE_OTHER)
Admission: RE | Admit: 2022-03-06 | Discharge: 2022-03-06 | Disposition: A | Discharge status: Home / Self Care | Payer: PPO | Source: Ambulatory Visit | Attending: Physician Assistant | Admitting: Physician Assistant

## 2022-03-06 ENCOUNTER — Encounter: Payer: Self-pay | Admitting: Physician Assistant

## 2022-03-06 ENCOUNTER — Ambulatory Visit: Payer: PPO | Admitting: Physician Assistant

## 2022-03-06 ENCOUNTER — Other Ambulatory Visit: Payer: Self-pay

## 2022-03-06 VITALS — BP 124/60 | HR 74 | Ht 62.0 in | Wt 89.0 lb

## 2022-03-06 DIAGNOSIS — E440 Moderate protein-calorie malnutrition: Secondary | ICD-10-CM

## 2022-03-06 DIAGNOSIS — R159 Full incontinence of feces: Secondary | ICD-10-CM

## 2022-03-06 DIAGNOSIS — Z8673 Personal history of transient ischemic attack (TIA), and cerebral infarction without residual deficits: Secondary | ICD-10-CM

## 2022-03-06 IMAGING — DX DG ABDOMEN 1V
1 series · 1 of 1 positions shown · non-contrast
Comparison: [DATE].  CT, [DATE].

CLINICAL DATA: Fecal incontinence for 2 years.

EXAM:
ABDOMEN - 1 VIEW

[abdomen kub]
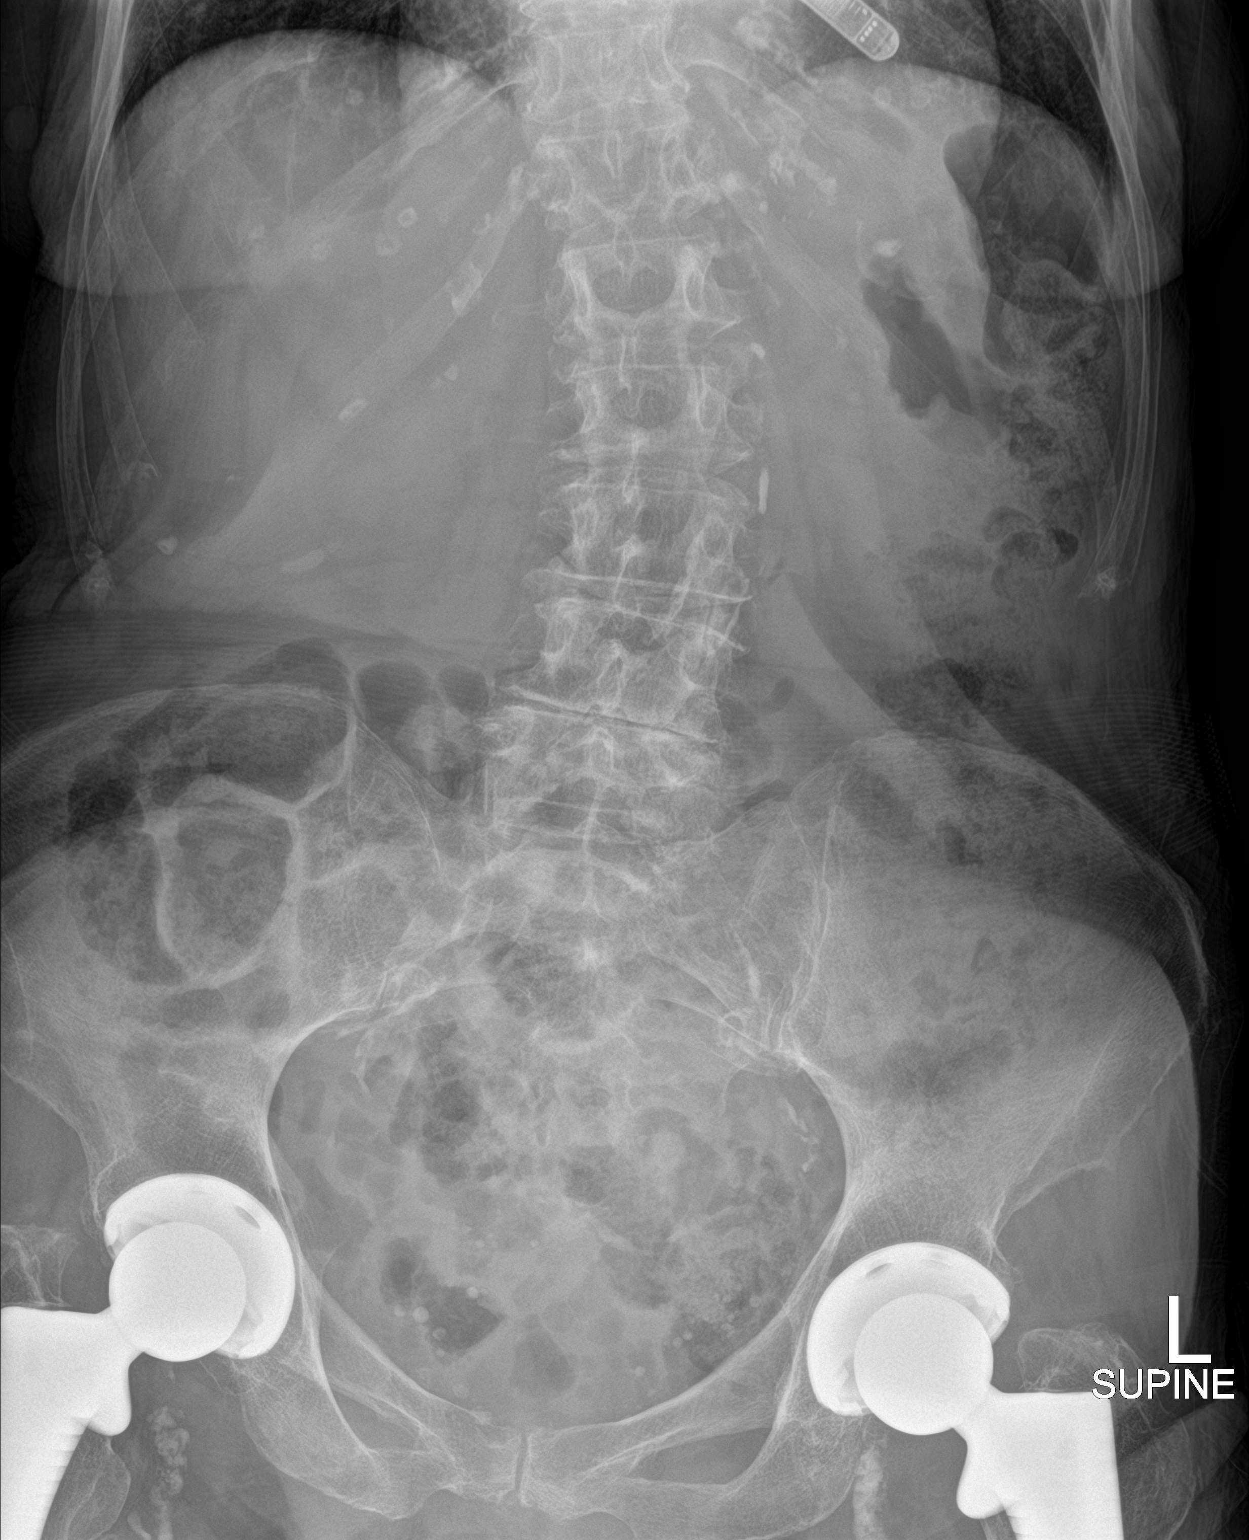

[1 of 1 positions shown; findings below may reference images not displayed]

FINDINGS: Normal bowel gas pattern.  Mild colonic stool burden.

Scattered aortic, iliac and femoral artery atherosclerotic
calcifications. No evidence of renal or ureteral stones.

Partly imaged, well well aligned and seated bilateral total hip
arthroplasties. No acute skeletal abnormality.
IMPRESSION: 1. No acute findings.
2. Normal bowel gas pattern.  Mild colonic stool burden.

## 2022-03-06 NOTE — Patient Instructions (Addendum)
You can go directly to the basement to the x-ray department. We will contact you with the results.  ? ?Use citracel or benefiber TWICE a day ? ?Recommendations for the patient: ?Avoid sugars and caffeine ?Keep a food and symptom diary to identify causitive factors ?Keep your bottom clean and dry without excessive wiping or using astringent cleaners ?Apply a barrier cream such as zinc oxide to the perianal skin ?Consider using incontinence pads to protect your skin and clothes from fecal soiling ? ?Add on benefiber 1 TBSP once or twice a day to help form the stool and prevent incontinence.  ? ?Fecal Incontinence ?Fecal incontinence, also called accidental bowel leakage, is not being able to control your bowels. This condition happens because the nerves or muscles around the anus do not work the way they should. This affects their ability to hold stool (feces). ?What are the causes? ?This condition may be caused by: ?Damage to the muscles at the end of the rectum (sphincter). ?Damage to the nerves that control bowel movements. ?Diarrhea. ?Chronic constipation. ?Pelvic floor dysfunction. This means the muscles in the pelvis do not work well. ?Loss of bowel storage capacity. This occurs when the rectum can no longer stretch in size in order to store feces. ?Inflammatory bowel disease (IBD), such as Crohn's disease. ?Irritable bowel syndrome (IBS). ?What increases the risk? ?You are more likely to develop this condition if you: ?Were born with bowels or a pelvis that did not form correctly. ?Have had rectal surgery. ?Have had radiation treatment for certain cancers. ?Have been pregnant, had a vaginal delivery, or had surgery that damaged the pelvic floor muscles. ?Had a complicated childbirth, spinal cord injury, or other trauma that caused nerve damage. ?Have a condition that can affect nerve function, such as diabetes, Parkinson's disease, or multiple sclerosis. ?Have a condition where the rectum drops down into the  anus or vagina (prolapse). ?Are 54 years of age or older. ?What are the signs or symptoms? ?The main symptom of this condition is not being able to control your bowels. You also might not be able to get to the bathroom before a bowel movement. ?How is this diagnosed? ?This condition is diagnosed with a medical history and physical exam. You may also have other tests, including: ?Blood tests. ?Urine tests. ?A rectal exam. ?Ultrasound. ?MRI. ?Colonoscopy. This is an exam that looks at your large intestine (colon). ?Anal manometry. This is a test that measures the strength of the anal sphincter. ?Anal electromyogram (EMG). This is a test that uses small electrodes to check for nerve damage. ?How is this treated? ?Treatment for this condition depends on the cause and severity. Treatment may also focus on addressing any underlying causes of this condition. Treatment may include: ?Medicines. This may include medicines to: ?Prevent diarrhea. ?Help with constipation (bulk-forming laxatives). ?Treat any underlying conditions. ?Biofeedback therapy. This can help to retrain muscles that are affected. ?Fiber supplements. These can help manage your bowel movements. ?Nerve stimulation. ?Injectable gel to promote tissue growth and better muscle control. ?Surgery. You may need: ?Sphincter repair surgery. ?Diversion surgery. This procedure lets feces pass out of your body through a hole in your abdomen. ?Follow these instructions at home: ?Eating and drinking ? ?Follow instructions from your health care provider about any eating or drinking restrictions. ?Work with a dietitian to come up with a healthy diet that will help you avoid the foods that can make your condition worse. ?Keep a diet diary to find out which foods or drinks could  be making your condition worse. ?Drink enough fluid to keep your urine pale yellow. ?Lifestyle ?Do not use any products that contain nicotine or tobacco, such as cigarettes and e-cigarettes. If you  need help quitting, ask your health care provider. This may help your condition. ?If you are overweight, talk with your health care provider about how to safely lose weight. This may help your condition. ?Increase your physical activity as told by your health care provider. This may help your condition. Always talk with your health care provider before starting a new exercise program. ?Carry a change of clothes and supplies to clean up quickly if you have an episode of fetal incontinence. ?Consider joining a fecal incontinence support group. You can find a support group online or in your local community. ?General instructions ? ?Take over-the-counter and prescription medicines only as told by your health care provider. This includes any supplements. ?Apply a moisture barrier, such as petroleum jelly, to your rectum. This protects the skin from irritation caused by ongoing leaking or diarrhea. ?Tell your health care provider if you are upset or depressed about your condition. ?Keep all follow-up visits as told by your health care provider. This is important. ?Where to find more information ?International Foundation for Functional Gastrointestinal Disorders: iffgd.org ?[employer]: patients.gi.org ?Contact a health care provider if: ?You have a fever. ?You have redness, swelling, or pain around your rectum. ?Your pain is getting worse or you lose feeling in your rectal area. ?You have blood in your stool. ?You feel sad or hopeless. ?You avoid social or work situations. ?Get help right away if: ?You stop having bowel movements. ?You cannot eat or drink without vomiting. ?You have rectal bleeding that does not stop. ?You have severe pain that is getting worse. ?You have symptoms of dehydration, including: ?Sleepiness or fatigue. ?Producing little or no urine, tears, or sweat. ?Dizziness. ?Dry mouth. ?Unusual irritability. ?Headache. ?Inability to think clearly. ?Summary ?Fecal incontinence, also  called accidental bowel leakage, is not being able to control your bowels. This condition happens because the nerves or muscles around the anus do not work the way they should. ?Treatment varies depending on the cause and severity of your condition. Treatment may also focus on addressing any underlying causes of this condition. ?Follow instructions from your health care provider about any eating or drinking restrictions, lifestyle changes, and skin care. ?Take over-the-counter and prescription medicines only as told by your health care provider. This includes any supplements. ?Tell your health care provider if your symptoms worsen or if you are upset or depressed about your condition. ?This information is not intended to replace advice given to you by your health care provider. Make sure you discuss any questions you have with your health care provider. ?Document Revised: 04/18/2018 Document Reviewed: 04/18/2018 ?Elsevier Patient Education ? 2022 [employer] ? ?

## 2022-03-09 NOTE — Progress Notes (Signed)
[facility] Summary Report / Loop Recorder 

## 2022-03-14 NOTE — Progress Notes (Signed)
Reviewed and agree with documentation and assessment and plan. K. Veena Aamirah Salmi , MD   

## 2022-04-03 ENCOUNTER — Ambulatory Visit (INDEPENDENT_AMBULATORY_CARE_PROVIDER_SITE_OTHER): Payer: PPO

## 2022-04-03 DIAGNOSIS — I63412 Cerebral infarction due to embolism of left middle cerebral artery: Secondary | ICD-10-CM

## 2022-04-03 LAB — CUP PACEART REMOTE DEVICE CHECK
Date Time Interrogation Session: 20230414230624
Implantable Pulse Generator Implant Date: 20210319

## 2022-04-19 NOTE — Progress Notes (Signed)
[facility] Summary Report / Loop Recorder 

## 2022-05-05 LAB — CUP PACEART REMOTE DEVICE CHECK
Date Time Interrogation Session: 20230517230918
Implantable Pulse Generator Implant Date: 20210319

## 2022-05-08 ENCOUNTER — Ambulatory Visit (INDEPENDENT_AMBULATORY_CARE_PROVIDER_SITE_OTHER): Payer: PPO

## 2022-05-08 DIAGNOSIS — I639 Cerebral infarction, unspecified: Secondary | ICD-10-CM

## 2022-05-23 ENCOUNTER — Other Ambulatory Visit: Payer: Self-pay | Admitting: *Deleted

## 2022-05-23 ENCOUNTER — Other Ambulatory Visit: Payer: Self-pay | Admitting: Internal Medicine

## 2022-05-23 MED ORDER — LISINOPRIL 10 MG PO TABS: 10.0000 mg | ORAL_TABLET | Freq: Every day | ORAL | 0 refills | Status: DC

## 2022-05-23 MED ORDER — CLOPIDOGREL BISULFATE 75 MG PO TABS: 75.0000 mg | ORAL_TABLET | Freq: Every day | ORAL | 0 refills | Status: DC

## 2022-05-23 MED ORDER — ATORVASTATIN CALCIUM 80 MG PO TABS: 80.0000 mg | ORAL_TABLET | Freq: Every day | ORAL | 0 refills | Status: DC

## 2022-05-24 NOTE — Progress Notes (Signed)
[facility] Summary Report / Loop Recorder 

## 2022-06-12 ENCOUNTER — Ambulatory Visit (INDEPENDENT_AMBULATORY_CARE_PROVIDER_SITE_OTHER): Payer: PPO

## 2022-06-12 DIAGNOSIS — I639 Cerebral infarction, unspecified: Secondary | ICD-10-CM | POA: Diagnosis not present

## 2022-06-12 LAB — CUP PACEART REMOTE DEVICE CHECK
Date Time Interrogation Session: 20230619230425
Implantable Pulse Generator Implant Date: 20210319

## 2022-07-05 NOTE — Progress Notes (Signed)
[facility] Summary Report / Loop Recorder 

## 2022-07-13 LAB — CUP PACEART REMOTE DEVICE CHECK
Date Time Interrogation Session: 20230722230352
Implantable Pulse Generator Implant Date: 20210319

## 2022-07-17 ENCOUNTER — Ambulatory Visit (INDEPENDENT_AMBULATORY_CARE_PROVIDER_SITE_OTHER): Payer: PPO

## 2022-07-17 DIAGNOSIS — I639 Cerebral infarction, unspecified: Secondary | ICD-10-CM | POA: Diagnosis not present

## 2022-07-22 ENCOUNTER — Emergency Department (HOSPITAL_COMMUNITY): Payer: PPO

## 2022-07-22 ENCOUNTER — Emergency Department (HOSPITAL_COMMUNITY)
Admission: EM | Admit: 2022-07-22 | Discharge: 2022-07-22 | Disposition: A | Discharge status: Skilled Nursing Facility | Payer: PPO | Attending: Emergency Medicine | Admitting: Emergency Medicine

## 2022-07-22 ENCOUNTER — Other Ambulatory Visit: Payer: Self-pay

## 2022-07-22 DIAGNOSIS — S52502A Unspecified fracture of the lower end of left radius, initial encounter for closed fracture: Secondary | ICD-10-CM

## 2022-07-22 DIAGNOSIS — I6782 Cerebral ischemia: Secondary | ICD-10-CM | POA: Insufficient documentation

## 2022-07-22 DIAGNOSIS — Y92002 Bathroom of unspecified non-institutional (private) residence single-family (private) house as the place of occurrence of the external cause: Secondary | ICD-10-CM | POA: Insufficient documentation

## 2022-07-22 DIAGNOSIS — Z20822 Contact with and (suspected) exposure to covid-19: Secondary | ICD-10-CM | POA: Diagnosis not present

## 2022-07-22 DIAGNOSIS — W16212A Fall in (into) filled bathtub causing other injury, initial encounter: Secondary | ICD-10-CM | POA: Insufficient documentation

## 2022-07-22 DIAGNOSIS — Z7901 Long term (current) use of anticoagulants: Secondary | ICD-10-CM | POA: Insufficient documentation

## 2022-07-22 DIAGNOSIS — Z79899 Other long term (current) drug therapy: Secondary | ICD-10-CM | POA: Diagnosis not present

## 2022-07-22 DIAGNOSIS — W19XXXA Unspecified fall, initial encounter: Secondary | ICD-10-CM

## 2022-07-22 DIAGNOSIS — R8289 Other abnormal findings on cytological and histological examination of urine: Secondary | ICD-10-CM | POA: Insufficient documentation

## 2022-07-22 DIAGNOSIS — S6992XA Unspecified injury of left wrist, hand and finger(s), initial encounter: Secondary | ICD-10-CM | POA: Diagnosis present

## 2022-07-22 DIAGNOSIS — Z7902 Long term (current) use of antithrombotics/antiplatelets: Secondary | ICD-10-CM | POA: Insufficient documentation

## 2022-07-22 DIAGNOSIS — M25522 Pain in left elbow: Secondary | ICD-10-CM | POA: Insufficient documentation

## 2022-07-22 LAB — CBC
HCT: 38.6 % (ref 36.0–46.0)
Hemoglobin: 13.2 g/dL (ref 12.0–15.0)
MCH: 31.9 pg (ref 26.0–34.0)
MCHC: 34.2 g/dL (ref 30.0–36.0)
MCV: 93.2 fL (ref 80.0–100.0)
Platelets: 233 10*3/uL (ref 150–400)
RBC: 4.14 MIL/uL (ref 3.87–5.11)
RDW: 13.1 % (ref 11.5–15.5)
WBC: 9.7 10*3/uL (ref 4.0–10.5)
nRBC: 0 % (ref 0.0–0.2)

## 2022-07-22 LAB — I-STAT CHEM 8, ED
BUN: 14 mg/dL (ref 8–23)
Calcium, Ion: 1.07 mmol/L — ABNORMAL LOW (ref 1.15–1.40)
Chloride: 97 mmol/L — ABNORMAL LOW (ref 98–111)
Creatinine, Ser: 0.5 mg/dL (ref 0.44–1.00)
Glucose, Bld: 117 mg/dL — ABNORMAL HIGH (ref 70–99)
HCT: 36 % (ref 36.0–46.0)
Hemoglobin: 12.2 g/dL (ref 12.0–15.0)
Potassium: 4.2 mmol/L (ref 3.5–5.1)
Sodium: 131 mmol/L — ABNORMAL LOW (ref 135–145)
TCO2: 25 mmol/L (ref 22–32)

## 2022-07-22 LAB — URINALYSIS, ROUTINE W REFLEX MICROSCOPIC
Bilirubin Urine: NEGATIVE
Glucose, UA: NEGATIVE mg/dL
Ketones, ur: NEGATIVE mg/dL
Nitrite: NEGATIVE
Protein, ur: NEGATIVE mg/dL
Specific Gravity, Urine: 1.013 (ref 1.005–1.030)
pH: 6 (ref 5.0–8.0)

## 2022-07-22 LAB — RESP PANEL BY RT-PCR (FLU A&B, COVID) ARPGX2
Influenza A by PCR: NEGATIVE
Influenza B by PCR: NEGATIVE
SARS Coronavirus 2 by RT PCR: NEGATIVE

## 2022-07-22 LAB — PROTIME-INR
INR: 1 (ref 0.8–1.2)
Prothrombin Time: 12.8 seconds (ref 11.4–15.2)

## 2022-07-22 LAB — COMPREHENSIVE METABOLIC PANEL
ALT: 33 U/L (ref 0–44)
AST: 38 U/L (ref 15–41)
Albumin: 4.1 g/dL (ref 3.5–5.0)
Alkaline Phosphatase: 71 U/L (ref 38–126)
Anion gap: 8 (ref 5–15)
BUN: 12 mg/dL (ref 8–23)
CO2: 24 mmol/L (ref 22–32)
Calcium: 8.9 mg/dL (ref 8.9–10.3)
Chloride: 99 mmol/L (ref 98–111)
Creatinine, Ser: 0.67 mg/dL (ref 0.44–1.00)
GFR, Estimated: >60 mL/min (ref >60)
Glucose, Bld: 124 mg/dL — ABNORMAL HIGH (ref 70–99)
Potassium: 3.8 mmol/L (ref 3.5–5.1)
Sodium: 131 mmol/L — ABNORMAL LOW (ref 135–145)
Total Bilirubin: 1.1 mg/dL (ref 0.3–1.2)
Total Protein: 6.8 g/dL (ref 6.5–8.1)

## 2022-07-22 LAB — ETHANOL: Alcohol, Ethyl (B): <10 mg/dL (ref <10)

## 2022-07-22 NOTE — ED Notes (Signed)
Patient transported to CT 

## 2022-07-22 NOTE — ED Provider Notes (Addendum)
[facility] EMERGENCY DEPARTMENT Provider Note   CSN: [id number] Arrival date & time: 07/22/22  4235     History  Chief Complaint  Patient presents with   Heather Nielsen is a 88 y.o. female.   Fall    88 year old female with medical history significant for prior CVA with residual aphasia, vitamin D deficiency, HLD, arthritis, urinary incontinence presenting to the emergency department after a mechanical fall.  The patient states that she fell in the bathtub and landed on her left arm  She denies any head trauma.  She states that she is on anticoagulation.  She endorses pain in the left elbow and left wrist.  She denies any other injuries or complaints.  Per family, she is at her baseline mental status.  She arrived to the emergency department GCS 15, ABC intact.  Home Medications Prior to Admission medications   Medication Sig Start Date End Date Taking? Authorizing Provider  atorvastatin (LIPITOR) 80 MG tablet Take 1 tablet (80 mg total) by mouth daily. 05/23/22   Philip Aspen, Limmie Patricia, MD  bismuth subsalicylate (PEPTO BISMOL) 262 MG/15ML suspension Take 30 mLs by mouth every 6 (six) hours as needed for diarrhea or loose stools or indigestion.    [provider]  clopidogrel (PLAVIX) 75 MG tablet Take 1 tablet (75 mg total) by mouth daily. 05/23/22   Philip Aspen, Limmie Patricia, MD  levETIRAcetam (KEPPRA) 500 MG tablet Take 1 tablet (500 mg total) by mouth 2 (two) times daily. 11/01/21   Micki Riley, MD  lisinopril (ZESTRIL) 10 MG tablet Take 1 tablet (10 mg total) by mouth daily. 05/23/22   Philip Aspen, Limmie Patricia, MD  REPATHA SURECLICK 140 MG/ML SOAJ INJECT 140 MG INTO THE SKIN EVERY 14 DAYS Patient taking differently: Inject 140 mg into the skin every 14 (fourteen) days. 11/01/21   Micki Riley, MD      Allergies    Penicillins and Pneumococcal vaccines    Review of Systems   Review of Systems  All other systems reviewed and are  negative.   Physical Exam Updated Vital Signs BP (!) 159/68   Pulse 69   Temp 97.8 F (36.6 C) (Oral)   Resp 17   Ht 5\' 2"  (1.575 m)   Wt 49.9 kg   SpO2 98%   BMI 20.12 kg/m  Physical Exam Vitals and nursing note reviewed.  Constitutional:      General: She is not in acute distress.    Appearance: She is well-developed.     Comments: GCS 15, ABC intact  HENT:     Head: Normocephalic and atraumatic.  Eyes:     Extraocular Movements: Extraocular movements intact.     Conjunctiva/sclera: Conjunctivae normal.     Pupils: Pupils are equal, round, and reactive to light.  Neck:     Comments: No midline tenderness to palpation of the cervical spine.  Range of motion intact Cardiovascular:     Rate and Rhythm: Normal rate and regular rhythm.  Pulmonary:     Effort: Pulmonary effort is normal. No respiratory distress.     Breath sounds: Normal breath sounds.  Chest:     Comments: Clavicles stable nontender to AP compression.  Chest wall stable and nontender to AP and lateral compression. Abdominal:     Palpations: Abdomen is soft.     Tenderness: There is no abdominal tenderness.  Musculoskeletal:     Cervical back: Neck supple.  Comments: No midline tenderness to palpation of the thoracic or lumbar spine.  Extremities atraumatic with intact range of motion with the exception of left elbow tenderness and left wrist tenderness and swelling. Intact radial pulses 2+ bilaterally  Skin:    General: Skin is warm and dry.  Neurological:     Mental Status: She is alert.     Comments: Cranial nerves II through XII grossly intact with baseline aphasia present.  Moving all 4 extremities spontaneously.  Sensation grossly intact all 4 extremities     ED Results / Procedures / Treatments   Labs (all labs ordered are listed, but only abnormal results are displayed) Labs Reviewed  COMPREHENSIVE METABOLIC PANEL - Abnormal; Notable for the following components:      Result Value    Sodium 131 (*)    Glucose, Bld 124 (*)    All other components within normal limits  URINALYSIS, ROUTINE W REFLEX MICROSCOPIC - Abnormal; Notable for the following components:   Hgb urine dipstick SMALL (*)    Leukocytes,Ua SMALL (*)    Bacteria, UA RARE (*)    All other components within normal limits  I-STAT CHEM 8, ED - Abnormal; Notable for the following components:   Sodium 131 (*)    Chloride 97 (*)    Glucose, Bld 117 (*)    Calcium, Ion 1.07 (*)    All other components within normal limits  RESP PANEL BY RT-PCR (FLU A&B, COVID) ARPGX2  CBC  ETHANOL  PROTIME-INR    EKG None  Radiology DG Wrist Complete Left  Result Date: 07/22/2022 CLINICAL DATA:  Blunt Trauma EXAM: LEFT WRIST - COMPLETE 3+ VIEW COMPARISON:  None Available. FINDINGS: Osteopenia. There is an impaction fracture of the distal radius. Alignment of the radiocarpal joint appears preserved. There is possible intra-articular extension. There is a calcific fragment along the dorsal aspect of the carpal bones which may reflect chondrocalcinosis versus triquetral fracture. There is associated soft tissue edema. Chondrocalcinosis. Degenerative changes throughout the fall bones with favored chronic widening of the scapholunate interval. Severe degenerative changes of the first CMC. Coarse calcifications of the forearm likely reflecting vascular calcifications IMPRESSION: 1. Impaction fracture of the distal radius. Radiocarpal joint alignment appears preserved. 2. Query a triquetral fracture versus artifact from chondrocalcinosis. 3. Chronic appearing widening of the scapholunate interval. Electronically Signed   By: Meda Klinefelter M.D.   On: 07/22/2022 12:16   DG Pelvis Portable  Result Date: 07/22/2022 CLINICAL DATA:  Trauma EXAM: PORTABLE PELVIS 1-2 VIEWS COMPARISON:  March 06, 2022 FINDINGS: Osteopenia. Status post bilateral hip replacement. Limited assessment of the sacrum secondary to overlapping bowel contents. No  definitive acute fracture or dislocation is noted. Degenerative changes of the lower lumbar spine. No pelvic diastasis. Atherosclerotic calcifications. IMPRESSION: No definitive acute fracture or dislocation of the pelvis on single view radiograph. If persistent concern, recommend additional dedicated radiographs and/or cross-sectional imaging. Electronically Signed   By: Meda Klinefelter M.D.   On: 07/22/2022 12:13   DG Elbow Complete Left  Result Date: 07/22/2022 CLINICAL DATA:  Blunt Trauma EXAM: LEFT ELBOW - COMPLETE 3+ VIEW COMPARISON:  None Available. FINDINGS: Osteopenia. No acute fracture or dislocation. Joint spaces and alignment are maintained. No area of erosion or osseous destruction. No unexpected radiopaque foreign body. Calcifications within the forearm, likely vascular versus sequela of remote prior trauma. IMPRESSION: No acute fracture or dislocation of the elbow. Electronically Signed   By: Meda Klinefelter M.D.   On: 07/22/2022 12:11  DG Chest Port 1 View  Result Date: 07/22/2022 CLINICAL DATA:  Trauma EXAM: PORTABLE CHEST 1 VIEW COMPARISON:  Chest radiograph dated November 21, 2020 FINDINGS: The cardiomediastinal silhouette is unchanged in contour.Atherosclerotic calcifications of the aorta. No pleural effusion. No pneumothorax. No acute pleuroparenchymal abnormality. Visualized abdomen is unremarkable. Cardiac loop recorder. Osteopenia. IMPRESSION: No acute cardiopulmonary abnormality. Electronically Signed   By: Meda Klinefelter M.D.   On: 07/22/2022 12:10   CT HEAD WO CONTRAST  Result Date: 07/22/2022 CLINICAL DATA:  Head trauma, moderate-severe; Polytrauma, blunt. Fall. EXAM: CT HEAD WITHOUT CONTRAST CT CERVICAL SPINE WITHOUT CONTRAST TECHNIQUE: Multidetector CT imaging of the head and cervical spine was performed following the standard protocol without intravenous contrast. Multiplanar CT image reconstructions of the cervical spine were also generated. RADIATION DOSE  REDUCTION: This exam was performed according to the departmental dose-optimization program which includes automated exposure control, adjustment of the mA and/or kV according to patient size and/or use of iterative reconstruction technique. COMPARISON:  CT head 11/22/2021. MRI head 11/22/2020. CTA head and neck 03/03/2020. FINDINGS: CT HEAD FINDINGS Brain: There is no evidence of an acute infarct, intracranial hemorrhage, mass, midline shift, or extra-axial fluid collection. Chronic left MCA territory infarcts are again noted with ex vacuo dilatation of the left lateral ventricle. There is also an unchanged chronic lacunar infarct in the left thalamus, and there is a background of chronic small vessel ischemia in the cerebral white matter. There is moderate cerebral atrophy. Vascular: Calcified atherosclerosis at the skull base. No hyperdense vessel. Skull: No fracture or suspicious osseous lesion. Sinuses/Orbits: Visualized paranasal sinuses and mastoid air cells are clear. Unremarkable orbits. Other: None. CT CERVICAL SPINE FINDINGS Alignment: Straightening of the normal cervical lordosis with chronic, degenerative grade 1 anterolisthesis of C3 on C4 and C7 on T1. Skull base and vertebrae: No acute fracture or suspicious osseous lesion. Asymmetric right-sided atlantooccipital arthropathy. Bilateral facet and partial interbody ankylosis at C2-3. Soft tissues and spinal canal: No prevertebral fluid or swelling. No visible canal hematoma. Disc levels: Advanced disc degeneration in the cervical and included upper thoracic spine. Advanced multilevel cervical facet arthrosis. Moderate neural foraminal stenosis on the right at C3-4 and on the left at C4-5 due to uncovertebral and facet spurring. Upper chest: Mild biapical pleuroparenchymal lung scarring. Other: Moderate calcific atherosclerosis at the left carotid bifurcation. IMPRESSION: 1. No evidence of acute intracranial abnormality. 2. Chronic ischemia including old  left MCA infarcts. 3. No acute cervical spine fracture. Electronically Signed   By: Sebastian Ache M.D.   On: 07/22/2022 11:31   CT CERVICAL SPINE WO CONTRAST  Result Date: 07/22/2022 CLINICAL DATA:  Head trauma, moderate-severe; Polytrauma, blunt. Fall. EXAM: CT HEAD WITHOUT CONTRAST CT CERVICAL SPINE WITHOUT CONTRAST TECHNIQUE: Multidetector CT imaging of the head and cervical spine was performed following the standard protocol without intravenous contrast. Multiplanar CT image reconstructions of the cervical spine were also generated. RADIATION DOSE REDUCTION: This exam was performed according to the departmental dose-optimization program which includes automated exposure control, adjustment of the mA and/or kV according to patient size and/or use of iterative reconstruction technique. COMPARISON:  CT head 11/22/2021. MRI head 11/22/2020. CTA head and neck 03/03/2020. FINDINGS: CT HEAD FINDINGS Brain: There is no evidence of an acute infarct, intracranial hemorrhage, mass, midline shift, or extra-axial fluid collection. Chronic left MCA territory infarcts are again noted with ex vacuo dilatation of the left lateral ventricle. There is also an unchanged chronic lacunar infarct in the left thalamus, and there is a background  of chronic small vessel ischemia in the cerebral white matter. There is moderate cerebral atrophy. Vascular: Calcified atherosclerosis at the skull base. No hyperdense vessel. Skull: No fracture or suspicious osseous lesion. Sinuses/Orbits: Visualized paranasal sinuses and mastoid air cells are clear. Unremarkable orbits. Other: None. CT CERVICAL SPINE FINDINGS Alignment: Straightening of the normal cervical lordosis with chronic, degenerative grade 1 anterolisthesis of C3 on C4 and C7 on T1. Skull base and vertebrae: No acute fracture or suspicious osseous lesion. Asymmetric right-sided atlantooccipital arthropathy. Bilateral facet and partial interbody ankylosis at C2-3. Soft tissues and  spinal canal: No prevertebral fluid or swelling. No visible canal hematoma. Disc levels: Advanced disc degeneration in the cervical and included upper thoracic spine. Advanced multilevel cervical facet arthrosis. Moderate neural foraminal stenosis on the right at C3-4 and on the left at C4-5 due to uncovertebral and facet spurring. Upper chest: Mild biapical pleuroparenchymal lung scarring. Other: Moderate calcific atherosclerosis at the left carotid bifurcation. IMPRESSION: 1. No evidence of acute intracranial abnormality. 2. Chronic ischemia including old left MCA infarcts. 3. No acute cervical spine fracture. Electronically Signed   By: Sebastian Ache M.D.   On: 07/22/2022 11:31    Procedures .Ortho Injury Treatment  Date/Time: 07/22/2022 3:00 PM  Performed by: Ernie Avena, MD Authorized by: Ernie Avena, MD   Consent:    Consent obtained:  Verbal   Consent given by:  PatientInjury location: wrist Location details: left wrist Injury type: fracture Fracture type: distal radius Pre-procedure neurovascular assessment: neurovascularly intact Immobilization: splint Splint type: sugar tong Splint Applied by: Milon Dikes Post-procedure neurovascular assessment: post-procedure neurovascularly intact       Medications Ordered in ED Medications - No data to display  ED Course/ Medical Decision Making/ A&P                           Medical Decision Making Amount and/or Complexity of Data Reviewed Labs: ordered. Radiology: ordered.    88 year old female with medical history significant for prior CVA with residual aphasia, vitamin D deficiency, HLD, arthritis, urinary incontinence presenting to the emergency department after a mechanical fall.  The patient states that she fell in the bathtub and landed on her left arm  She denies any head trauma.  She states that she is on anticoagulation.  She endorses pain in the left elbow and left wrist.  She denies any other injuries or complaints.   Per family, she is at her baseline mental status.  She arrived to the emergency department GCS 15, ABC intact.  Currently, she is awake, alert, and protecting her own airway and is hemodynamically stable.  Trauma imaging revealed (full reports in EMR): Portable CXR:  No evidence of pneumothorax or tracheal deviation Portable Pelvis:  No evidence of acute hip fracture or malalignment CT scans: CT head and CT cervical spine without acute fracture or malalignment of the cervical spine, no acute intracranial abnormality.  No evidence of traumatic injury.  Elbow x-ray on the left: No acute fracture or malalignment Left wrist x-ray: IMPRESSION:  1. Impaction fracture of the distal radius. Radiocarpal joint  alignment appears preserved.  2. Query a triquetral fracture versus artifact from  chondrocalcinosis.  3. Chronic appearing widening of the scapholunate interval.    There were no significant lab abnormalities.  Patient with bacteria and small leukocytes, 11-20 WBCs, negative nitrates, small hemoglobin, negative symptoms for UTI with no dysuria, no increased frequency, no confusion.  Will not treat asymptomatic bacteriuria at  this time.  Regarding the patient's left wrist fracture, she was placed in a splint. Plan will be for outpatient orthopedic follow-up. Referral placed, advised Tylenol and Ibuprofen for pain control. Pt stable for discharge.   Final Clinical Impression(s) / ED Diagnoses Final diagnoses:  Fall, initial encounter  Closed fracture of distal end of left radius, unspecified fracture morphology, initial encounter    Rx / DC Orders ED Discharge Orders          Ordered    AMB referral to orthopedics        07/22/22 1527              Ernie AvenaLawsing, Jarrah Babich, MD 07/23/22 [id number]    Ernie AvenaLawsing, Shelley Cocke, MD 07/23/22 [id number]    Ernie AvenaLawsing, Thom Ollinger, MD 07/23/22 [phone]

## 2022-07-22 NOTE — Progress Notes (Addendum)
Orthopedic Tech Progress Note Patient Details:  Heather Nielsen 1934/02/26 [id number]  Well-padded sugartong and sling applied. No complaints of any areas of tightness or irritation form the splint. Movement and sensation of digits remained intact.  Ortho Devices Type of Ortho Device: Sugartong splint, Arm sling Ortho Device/Splint Location: LUE Ortho Device/Splint Interventions: Ordered, Application, Adjustment   Post Interventions Patient Tolerated: Fair Instructions Provided: Care of device, Adjustment of device  [name] 07/22/2022, 7:06 PM

## 2022-07-22 NOTE — ED Notes (Signed)
Discharge instructions reviewed with patient's family. Patient's family denies any questions or concerns.  Pt brought out to lobby with family for transport back to independent living.

## 2022-07-22 NOTE — ED Triage Notes (Addendum)
Pt BIB EMS from independent facility for a fall. Pt had a fall in the bath tub on the left shoulder, no injury to the head. Pt endorses blood thinner use. Pt only endorses pain in the left shoulder.   EMS vitals 156/77 HR 72 98% CBG 152

## 2022-07-22 NOTE — Discharge Instructions (Signed)
Please follow-up with orthopedics. Recommend Tylenol and Ibuprofen for pain control

## 2022-08-04 ENCOUNTER — Encounter: Payer: Self-pay | Admitting: Neurology

## 2022-08-17 NOTE — Progress Notes (Signed)
[facility] Summary Report / Loop Recorder 

## 2022-08-18 ENCOUNTER — Ambulatory Visit (INDEPENDENT_AMBULATORY_CARE_PROVIDER_SITE_OTHER): Payer: PPO

## 2022-08-18 DIAGNOSIS — I639 Cerebral infarction, unspecified: Secondary | ICD-10-CM | POA: Diagnosis not present

## 2022-08-19 LAB — CUP PACEART REMOTE DEVICE CHECK
Date Time Interrogation Session: 20230830231225
Implantable Pulse Generator Implant Date: 20210319

## 2022-09-06 NOTE — Progress Notes (Signed)
[facility] Summary Report / Loop Recorder 

## 2022-09-19 LAB — CUP PACEART REMOTE DEVICE CHECK
Date Time Interrogation Session: 20231002231619
Implantable Pulse Generator Implant Date: 20210319

## 2022-09-20 ENCOUNTER — Ambulatory Visit (INDEPENDENT_AMBULATORY_CARE_PROVIDER_SITE_OTHER): Payer: PPO

## 2022-09-20 DIAGNOSIS — I639 Cerebral infarction, unspecified: Secondary | ICD-10-CM | POA: Diagnosis not present

## 2022-09-21 ENCOUNTER — Encounter: Payer: Self-pay | Admitting: Neurology

## 2022-09-21 ENCOUNTER — Ambulatory Visit: Payer: PPO | Admitting: Neurology

## 2022-09-21 VITALS — BP 153/58 | HR 75 | Ht 62.0 in | Wt 97.1 lb

## 2022-09-21 DIAGNOSIS — R569 Unspecified convulsions: Secondary | ICD-10-CM

## 2022-09-21 DIAGNOSIS — Z8673 Personal history of transient ischemic attack (TIA), and cerebral infarction without residual deficits: Secondary | ICD-10-CM | POA: Diagnosis not present

## 2022-09-21 MED ORDER — LEVETIRACETAM 500 MG PO TABS: 500.0000 mg | ORAL_TABLET | Freq: Two times a day (BID) | ORAL | 3 refills | Status: DC

## 2022-09-21 NOTE — Progress Notes (Signed)
[facility] [address] [place]. [place] [address] [phone]       OFFICE FOLLOW UP VISIT NOTE  Ms. Heather Nielsen Date of Birth:  January 11, 1934 Medical Record Number:  [id number]   Referring MD: Peggye Pitt  Reason for Referral: Seizure HPI: Initial visit 03/22/2021 Heather Nielsen is a 88 year old pleasant Caucasian lady seen for consultation visit today for seizures.  History is obtained from the patient and her daughter as well as review of electronic medical records and personally reviewed imaging films in PACS.  She has past medical history of hypertension, hyperlipidemia, arthritis and cryptogenic left MCA infarct on 03/04/2020 with residual expressive aphasia.  She was last seen by me for follow-up further on 04/06/2020 and still had significant aphasia.  She has been living at independent living at [facility] but follow-up 3 to 4 weeks prior to 11/21/2020 when she was seen at the hospital she was having some increasing episodes of labile affect with easy crying and laughter and refusal daily checks.  Patient then was noted as having sudden onset of speech difficulty and disorientation.  The daughter called 911 and when they arrived they found the patient to have stool incontinence and significant amount of diarrhea in the toilet and she had soiled herself.  There was no reported tongue biting there was no witnessed seizure activity but patient was quite confused and disoriented.  There is no signs of any recent infection.  Patient had somewhat similar episode a few weeks ago when she had soiled herself and was confused and disoriented after that.  Patient had an EEG done which showed continuous 3 to 6 Hz Teater delta range slowing in the left hemisphere maximal in the left temporal region.  MRI scan of the brain showed no acute abnormality but did show remote infarcts in left basal ganglia/corona radiata, left thalamus and left cerebellum with left temporal lobe as well.  There  was 8 mm meningioma noted in the left frontotemporal convexity without mass-effect.  Comprehensive metabolic panel was significant only for slightly low sodium of 133.  CBC magnesium and phosphorus are normal.  UA was not checked.  Covid test was negative.  Patient started on Keppra for presumed seizures and has done well since then.  She is tolerating finder milligram twice daily fairly well without any behavioral side effects no dizziness.  She has had no further episodes of incontinence or confusion disorientation.  She remains on Plavix for stroke prevention and is tolerating well without bruising or bleeding.  Blood pressure has been high in the 150s is usually today it is 166/77.  She is tolerating Lipitor well without muscle aches and pains but not sure when last lipid profile was checked.  There is no prior history of seizures, significant head injury with loss of consciousness.  Patient still fiercely independent and tries to take her own blood pressure and take her own medicines.  She was given a prescription for Keppra but she ran out of it 2 days ago. Update 09/21/2021: She returns for follow-up after last visit 6 months ago.  She is accompanied by her daughter.  She states she is doing well.  She has had no seizure or seizure-like episodes of confusion or disorientation.  She is tolerating Keppra well without any side effects.  She had lab work done on 03/22/2021 which showed hemoglobin A1c 5.8.  Total cholesterol was 239, LDL cholesterol was elevated at 1 1 9  mg percent and HDL was also 1 1 3  mg percent.  EEG on 04/19/2021 showed focal left temporal slowing but no definite epileptiform activity.  She has since been started on Repatha injections by me and seems to be tolerating it well without any side effects.  She continues to have occasional word finding difficulties and word hesitancy but is learned to talk slowly.  She has moved and is now staying at [facility] independent living and likes the place  much better than the older 1.  She has no new complaints.  She is tolerating Plavix well but does have minor bruising.  She is still on Lipitor 80 mg but she has not had any follow-up lipid profile checked to see if we can reduce this.  She continues to have frequent urinary incontinence but has not yet discussed this with primary care physician. Update 09/21/2022 : She returns for follow-up after last visit 1 year ago.  She is accompanied by her daughter.  Patient states she is doing well.  She has had no definite weakness seizure activity.  She has had no stroke or TIA symptoms either.  She remains on Plavix which is tolerating well without bleeding but does get easy bruising on her legs.  She had surgery for incarcerated inguinal hernia in December 2022.  She was seen in the ED on 07/22/2022 for a fall in the bathtub.  Ct scan of the head showed no acute abnormality and old left MCA infarct.  She is tolerating Repatha injections well and states primary care physician did lipid profile but I do not have access to those results.  Blood pressure well controlled. ROS:   14 system review of systems is positive for incontinence speech difficulties, confusion, disorientation, incontinence of stool, emotional lability all other systems negative  PMH:  Past Medical History:  Diagnosis Date   Arthritis    Complication of anesthesia    CVA (cerebral vascular accident) ([place])    Hyperlipemia    Hypertension    PONV (postoperative nausea and vomiting)    Stroke ([place])    Urinary incontinence    Vitamin D deficiency     Social History:  Social History   Socioeconomic History   Marital status: Widowed    Spouse name: Not on file   Number of children: 5   Years of education: Not on file   Highest education level: Not on file  Occupational History   Not on file  Tobacco Use   Smoking status: Former    Packs/day: 0.25    Years: 25.00    Total pack years: 6.25    Types: Cigarettes    Quit date:  12/18/1984    Years since quitting: 37.7   Smokeless tobacco: Never  Vaping Use   Vaping Use: Never used  Substance and Sexual Activity   Alcohol use: Yes    Alcohol/week: 0.0 standard drinks of alcohol    Comment: gin and tonic and glass of wine daily    Drug use: No   Sexual activity: Not on file  Other Topics Concern   Not on file  Social History Narrative   Lives alone   Right Handed   Drinks 1-2 cups caffeine daily   Social Determinants of Health   Financial Resource Strain: Not on file  Food Insecurity: Not on file  Transportation Needs: Not on file  Physical Activity: Not on file  Stress: Not on file  Social Connections: Not on file  Intimate Partner Violence: Not on file    Medications:   Current  Outpatient Medications on File Prior to Visit  Medication Sig Dispense Refill   atorvastatin (LIPITOR) 80 MG tablet Take 1 tablet (80 mg total) by mouth daily. 90 tablet 0   bismuth subsalicylate (PEPTO BISMOL) 262 MG/15ML suspension Take 30 mLs by mouth every 6 (six) hours as needed for diarrhea or loose stools or indigestion.     clopidogrel (PLAVIX) 75 MG tablet Take 1 tablet (75 mg total) by mouth daily. 90 tablet 0   levETIRAcetam (KEPPRA) 500 MG tablet Take 1 tablet (500 mg total) by mouth 2 (two) times daily. 180 tablet 3   lisinopril (ZESTRIL) 10 MG tablet Take 1 tablet (10 mg total) by mouth daily. 90 tablet 0   REPATHA SURECLICK 140 MG/ML SOAJ INJECT 140 MG INTO THE SKIN EVERY 14 DAYS (Patient taking differently: Inject 140 mg into the skin every 14 (fourteen) days.) 2 mL 11   No current facility-administered medications on file prior to visit.    Allergies:   Allergies  Allergen Reactions   Penicillins Other (See Comments)    Urinary issues--pt.says "felt like razor blades in her bladder"   Pneumococcal Vaccines Hives, Itching and Rash    Physical Exam General: Frail elderly petite Caucasian lady, seated, in no evident distress Head: head normocephalic and  atraumatic.   Neck: supple with no carotid or supraclavicular bruits Cardiovascular: regular rate and rhythm, no murmurs Musculoskeletal: no deformity mild kyphoscoliosis Skin:  no rash/petichiae Vascular:  Normal pulses all extremities  Neurologic Exam Mental Status: Awake and fully alert. Oriented to place and time. Recent and remote memory diminished. Attention span, concentration and fund of knowledge diminished mood and affect slightly inappropriate with mild pseudobulbar affect..  Mild expressive aphasia with nonfluent speech with occasional paraphasic errors and word finding difficulties.  Comprehension seems better preserved.  Difficulty with naming but able to repeat quite well. Cranial Nerves: Fundoscopic exam not done s. Pupils equal, briskly reactive to light. Extraocular movements full without nystagmus. Visual fields full to confrontation. Hearing intact. Facial sensation intact. Face, tongue, palate moves normally and symmetrically.  Motor: Normal bulk and tone. Normal strength in all tested extremity muscles.  Diminished fine finger movements on the right.  Orbits left over right upper extremity. Sensory.: intact to touch , pinprick , position and vibratory sensation.  Coordination: Rapid alternating movements normal in all extremities. Finger-to-nose and heel-to-shin performed accurately bilaterally. Gait and Station: Arises from chair without difficulty. Stance is normal. Gait demonstrates normal stride length and balance . Able to heel, toe and tandem walk with mild difficulty.  Reflexes: 1+ and symmetric. Toes downgoing.       ASSESSMENT: 88 year old patient with 2 episodes of worsening aphasia confusion disorientation and stool incontinence possibly unwitnessed seizures with postictal confusion symptomatic seizures from her remote left MCA infarct.  History of cryptogenic left MCA infarct with residual aphasia in March 2021.  Vascular risk factors of hypertension and  hyperlipidemia.   She is doing well on Keppra without recurrent episodes   PLAN: I had a long discussion with the patient and her daughter regarding her remote stroke and symptomatic seizures since she appears to be quite stable.  Commend she continue Keppra 500 mg twice daily for seizure prophylaxis and Plavix 75 mg daily for stroke prevention along with aggressive risk factor modification with strict control of hypertension with blood pressure goal below 140/90, lipids with LDL cholesterol goal below 70 mg % and diabetes with hemoglobin A1c goal below 6.5%.  Check screening carotid ultrasound  study.  I advised her to get up slowly and avoid sudden and quick movements.  Return for follow-up in the future only as needed .Greater than 50% time during this 35-minute visit was spent in counseling and coordination of care about her episode of possible seizures and remote stroke and answering questions. Delia Heady, MD Note: This document was prepared with digital dictation and possible smart phrase technology. Any transcriptional errors that result from this process are unintentional.

## 2022-09-21 NOTE — Patient Instructions (Signed)
I had a long discussion with the patient and her daughter regarding her remote stroke and symptomatic seizures since she appears to be quite stable.  Commend she continue Keppra 500 mg twice daily for seizure prophylaxis and Plavix 75 mg daily for stroke prevention along with aggressive risk factor modification with strict control of hypertension with blood pressure goal below 140/90, lipids with LDL cholesterol goal below 70 mg % and diabetes with hemoglobin A1c goal below 6.5%.  Check screening carotid ultrasound study.  I advised her to get up slowly and avoid sudden and quick movements.  Return for follow-up in the future only as needed .

## 2022-09-25 NOTE — Progress Notes (Signed)
[facility] Summary Report / Loop Recorder 

## 2022-10-05 ENCOUNTER — Ambulatory Visit (HOSPITAL_COMMUNITY)
Admission: RE | Admit: 2022-10-05 | Discharge: 2022-10-05 | Disposition: A | Discharge status: Home / Self Care | Payer: PPO | Source: Ambulatory Visit | Attending: Neurology | Admitting: Neurology

## 2022-10-05 DIAGNOSIS — Z8673 Personal history of transient ischemic attack (TIA), and cerebral infarction without residual deficits: Secondary | ICD-10-CM | POA: Insufficient documentation

## 2022-10-05 NOTE — Progress Notes (Signed)
Carotid duplex has been completed.   Preliminary results in CV Proc.   Heather Nielsen Heather Nielsen 10/05/2022 1:26 PM

## 2022-10-23 ENCOUNTER — Ambulatory Visit (INDEPENDENT_AMBULATORY_CARE_PROVIDER_SITE_OTHER): Payer: PPO

## 2022-10-23 DIAGNOSIS — I639 Cerebral infarction, unspecified: Secondary | ICD-10-CM | POA: Diagnosis not present

## 2022-10-23 LAB — CUP PACEART REMOTE DEVICE CHECK
Date Time Interrogation Session: 20231105230625
Implantable Pulse Generator Implant Date: 20210319

## 2022-11-07 ENCOUNTER — Other Ambulatory Visit: Payer: Self-pay | Admitting: Neurology

## 2022-11-13 ENCOUNTER — Other Ambulatory Visit: Payer: Self-pay | Admitting: Internal Medicine

## 2022-11-20 NOTE — Progress Notes (Signed)
[facility] Summary Report / Loop Recorder 

## 2022-11-27 ENCOUNTER — Ambulatory Visit (INDEPENDENT_AMBULATORY_CARE_PROVIDER_SITE_OTHER): Payer: PPO

## 2022-11-27 DIAGNOSIS — I639 Cerebral infarction, unspecified: Secondary | ICD-10-CM | POA: Diagnosis not present

## 2022-11-27 LAB — CUP PACEART REMOTE DEVICE CHECK
Date Time Interrogation Session: 20231210231107
Implantable Pulse Generator Implant Date: 20210319

## 2022-12-29 NOTE — Progress Notes (Signed)
[facility] Summary Report / Loop Recorder

## 2023-01-01 ENCOUNTER — Ambulatory Visit (INDEPENDENT_AMBULATORY_CARE_PROVIDER_SITE_OTHER): Payer: PPO

## 2023-01-01 DIAGNOSIS — I639 Cerebral infarction, unspecified: Secondary | ICD-10-CM

## 2023-01-02 LAB — CUP PACEART REMOTE DEVICE CHECK
Date Time Interrogation Session: 20240112230723
Implantable Pulse Generator Implant Date: 20210319

## 2023-01-10 ENCOUNTER — Other Ambulatory Visit: Payer: Self-pay

## 2023-01-10 DIAGNOSIS — Z8673 Personal history of transient ischemic attack (TIA), and cerebral infarction without residual deficits: Secondary | ICD-10-CM

## 2023-01-10 MED ORDER — LEVETIRACETAM 500 MG PO TABS: 500.0000 mg | ORAL_TABLET | Freq: Two times a day (BID) | ORAL | 3 refills | Status: DC

## 2023-01-11 ENCOUNTER — Telehealth: Payer: Self-pay | Admitting: *Deleted

## 2023-01-11 NOTE — Telephone Encounter (Signed)
[facility] [place]  Patient requesting refills of Plavix, Lipitor, Lisinopril.   Last office visit was 11/08/21.  An office visit is required for further refills  Left message on machine for daughter [name]

## 2023-01-15 ENCOUNTER — Other Ambulatory Visit: Payer: Self-pay | Admitting: Internal Medicine

## 2023-01-16 ENCOUNTER — Other Ambulatory Visit: Payer: Self-pay

## 2023-01-16 MED ORDER — ATORVASTATIN CALCIUM 80 MG PO TABS: 80.0000 mg | ORAL_TABLET | Freq: Every day | ORAL | 2 refills | Status: DC

## 2023-01-16 MED ORDER — CLOPIDOGREL BISULFATE 75 MG PO TABS: 75.0000 mg | ORAL_TABLET | Freq: Every day | ORAL | 0 refills | Status: DC

## 2023-01-16 MED ORDER — LISINOPRIL 10 MG PO TABS: 10.0000 mg | ORAL_TABLET | Freq: Every day | ORAL | 0 refills | Status: DC

## 2023-01-25 ENCOUNTER — Encounter: Payer: PPO | Admitting: Internal Medicine

## 2023-02-01 ENCOUNTER — Encounter: Payer: Self-pay | Admitting: Internal Medicine

## 2023-02-01 ENCOUNTER — Ambulatory Visit: Payer: PPO | Admitting: Internal Medicine

## 2023-02-01 VITALS — BP 140/80 | HR 72 | Temp 98.3°F | Resp 18 | Ht 61.0 in | Wt 99.2 lb

## 2023-02-01 DIAGNOSIS — N182 Chronic kidney disease, stage 2 (mild): Secondary | ICD-10-CM | POA: Insufficient documentation

## 2023-02-01 DIAGNOSIS — Z681 Body mass index (BMI) 19 or less, adult: Secondary | ICD-10-CM

## 2023-02-01 DIAGNOSIS — Z8673 Personal history of transient ischemic attack (TIA), and cerebral infarction without residual deficits: Secondary | ICD-10-CM

## 2023-02-01 DIAGNOSIS — E785 Hyperlipidemia, unspecified: Secondary | ICD-10-CM

## 2023-02-01 DIAGNOSIS — I10 Essential (primary) hypertension: Secondary | ICD-10-CM

## 2023-02-01 DIAGNOSIS — M159 Polyosteoarthritis, unspecified: Secondary | ICD-10-CM

## 2023-02-01 DIAGNOSIS — G40909 Epilepsy, unspecified, not intractable, without status epilepticus: Secondary | ICD-10-CM

## 2023-02-01 DIAGNOSIS — Z0000 Encounter for general adult medical examination without abnormal findings: Secondary | ICD-10-CM

## 2023-02-01 MED ORDER — REPATHA SURECLICK 140 MG/ML ~~LOC~~ SOAJ: SUBCUTANEOUS | 10 refills | Status: DC

## 2023-02-01 MED ORDER — ATORVASTATIN CALCIUM 80 MG PO TABS: 80.0000 mg | ORAL_TABLET | Freq: Every day | ORAL | 3 refills | Status: AC

## 2023-02-01 MED ORDER — LEVETIRACETAM 500 MG PO TABS
500.0000 mg | ORAL_TABLET | Freq: Two times a day (BID) | ORAL | 3 refills | Status: DC
Start: 2023-02-01 — End: 2023-02-09

## 2023-02-01 MED ORDER — CLOPIDOGREL BISULFATE 75 MG PO TABS
75.0000 mg | ORAL_TABLET | Freq: Every day | ORAL | 3 refills | Status: AC
Start: 2023-02-01 — End: ?

## 2023-02-01 MED ORDER — LISINOPRIL 10 MG PO TABS: 10.0000 mg | ORAL_TABLET | Freq: Every day | ORAL | 3 refills | Status: DC

## 2023-02-01 NOTE — Assessment & Plan Note (Signed)
She has not had any recent seizures.  She remains on keppra at this time.  I have reviewed her notes from neurology.

## 2023-02-01 NOTE — Assessment & Plan Note (Signed)
Her BP is borderline.  We will see what her BP is running on her next visit.  We may need to adjust her lisinopril at that time.

## 2023-02-01 NOTE — Assessment & Plan Note (Signed)
Her goal LDL with her stroke is less than 70.   She is on lipitor and repatha but has not had a FLP done in a while.  We will check this today.

## 2023-02-01 NOTE — Assessment & Plan Note (Addendum)
She has long standing aphasia from her stroke but no residual problems.  We will continue with risk factor modificaton and she remains on plavix daily.

## 2023-02-01 NOTE — Assessment & Plan Note (Signed)
She has arthritis of multiple areas including her hips, hands, and knees.  She has seen [facility] who is also following her shoulder.  She has had steriod injections in her knee in the past.  We will continue to follow.

## 2023-02-01 NOTE — Progress Notes (Signed)
Preventive Screening-Counseling & Management     Heather Nielsen is a 89 y.o. female who presents for Medicare Annual/Subsequent preventive examination.  The patient is an 89 yo female who is a resident in the independent living here at [facility] who presents today for her annual wellness visit.  The patient was previously seen by Dr. Jerilee Hoh about 1 year ago and has been a resident here at [facility] for about 1 year.  Her last eye exam was about 3 years ago where she has glaucoma.  I am told she has never had a colonscopy.  She has not had any recent mammograms.  She has not had a bone density.  She denies any urinary incontinence.  She has been getting yearly flu vaccines.  She had a pneumovax 23 in 12/2012 where she had an allergic reaction but she is not sure what that reaction was.  She has not had a prevnar 13 vaccine.  She has not had any shingles vaccine.  She is not interested in the RSV vaccine.  The patient has had 4 COVID-19 vaccines including 2 boosters.  She denies any depression or anxiety.  She is not on an ASA.  The patient is a 89 year old female who presents for a follow-up evaluation of hypertension.  She was diagnosed with HTN when she had her stoke in 2021.   The patient has been checking her blood pressure at home. The patient's blood pressure has ranged systollically [id number].  The patient's current medications include: lisinopril 5m daily.  The patient has been tolerating her medications well. The patient denies any headache, visual changes, dizziness, lightheadness, chest pain, shortness of breath, weakness/numbness, and edema.   It is also mentioned in the chart that she has some mild Stage II CKD.  She denies any NSAID use at this time.  The patient also returns today for routine followup on her cholesterol. Overall, she states she is doing well and is without any complaints or problems at this time. She specifically denies abdominal pain, nausea, vomiting, diarrhea,  myalgias, and fatigue. She remains on dietary management as well as a regular exercise program and the following cholesterol lowering medications with repatha 1459msubcut q 2 weeks and lipitor 8044mhs. She is fasting in anticipation for labs today.   Heather Nielsen a 88 27ar old pleasant Caucasian who also has a history of cerebrovascular disease where she had a stroke in 03/04/2020.  She is followed by neurology with has last visit in 09/2022.  They felt this was a cryptogenic left MCA infarct on 03/04/2020 with residual expressive aphasia.  She has no residual weakness, vision problems and it may have effected her memory very mildly.   She was noted have seizures in 11/2020 where she had increasing episodes of labile affect with easy crying and laughter and refusal daily checks.  Patient then was noted as having sudden onset of speech difficulty and disorientation.  The daughter called [id number] when they arrived they found the patient to have stool incontinence and significant amount of diarrhea in the toilet and she had soiled herself.  There was no reported tongue biting there was no witnessed seizure activity but patient was quite confused and disoriented.  There is no signs of any recent infection.  Patient had somewhat similar episode a few weeks ago when she had soiled herself and was confused and disoriented after that.  The patient had an EEG done which showed continuous 3 to 6 Hz Teater delta range  slowing in the left hemisphere maximal in the left temporal region.  MRI scan of the brain showed no acute abnormality but did show remote infarcts in left basal ganglia/corona radiata, left thalamus and left cerebellum with left temporal lobe as well.  There was 8 mm meningioma noted in the left frontotemporal convexity without mass-effect.  Comprehensive metabolic panel was significant only for slightly low sodium of 133.  CBC magnesium and phosphorus were normal.  The patient was started on Keppra for  presumed seizures and has done well since then.  She has had no further episodes of incontinence or confusion disorientation.  She remains on Plavix for stroke prevention and is tolerating well without bruising or bleeding.  A carotid US was done on 10/05/2022 which showed velocities in the right ICA are consistent with a 1-39% stenosis. Velocities in the left ICA are consistent with a 1-39% stenosis. Implantable loop recorder placed in 02/2020 where they have been interrogating this (last interrogation 12/2022) and this has showed no arrythmias.   The patient also has a history of osteoarthritis including her hands and knees.  She does see [facility] with last visit in 09/2022 where she had a recent fall in the shower.  They followed up with her where she was noted to have an acute left distal radius fracture which was stable and they stop her arm brace at that time.  They it had helaed nicely.  They also noted she has arthritis about the hand especially the [facility] thumb joint.  She also has shoulder stiffness with likely degenerative rotator cuff tear which they are going to follow.  She has arthritis of both knees and has had multiple left knee injections in the past.  She was sent to see GI in 02/2022 for fecal incontinence.  GI felt this was due to a history of  5 kids, recent CVA, most likely component of pelvic floor dysfunction or anal sphincter weakenss/decreased rectal compliance.  They added benefiber to ulk her stool.  She is not having problems with fecal incontinence at this time.      Are there smokers in your home (other than you)? No  Risk Factors Current exercise habits:  walks, morning class with weight training   Dietary issues discussed: none   Depression Screen (Note: if answer to either of the following is "Yes", a more complete depression screening is indicated)   Over the past two weeks, have you felt down, depressed or hopeless? No  Over the past two weeks, have you felt  little interest or pleasure in doing things? No  Have you lost interest or pleasure in daily life? No  Do you often feel hopeless? No  Do you cry easily over simple problems? No  Activities of Daily Living In your present state of health, do you have any difficulty performing the following activities?:  Driving? No Managing money?  No Feeding yourself? No Getting from bed to chair? No Climbing a flight of stairs? No Preparing food and eating?: No Bathing or showering? No Getting dressed: No Getting to the toilet? No Using the toilet:No Moving around from place to place: No In the past year have you fallen or had a near fall?:Yes   Are you sexually active?  No  Do you have more than one partner?  No  Hearing Difficulties: No Do you often ask people to speak up or repeat themselves? No Do you experience ringing or noises in your ears? No Do you have difficulty understanding  soft or whispered voices? No   Do you feel that you have a problem with memory? No  Do you often misplace items? No  Do you feel safe at home?  Yes  Cognitive Testing  Alert? Yes  Normal Appearance?Yes  Oriented to person? Yes  Place? Yes   Time? Yes  Recall of three objects?  Yes  Can perform simple calculations? Yes  Displays appropriate judgment?Yes  Can read the correct time from a watch face?Yes  Fall Risk Prevention  Any stairs in or around the home? Yes  If so, are there any without handrails? No  Home free of loose throw rugs in walkways, pet beds, electrical cords, etc? Yes  Adequate lighting in your home to reduce risk of falls? Yes  Use of a cane, walker or w/c? No    Time Up and Go  Was the test performed? No .        Advanced Directives have been discussed with the patient? Yes   List the Names of Other Physician/Practitioners you currently use: Patient Care Team: Isaac Bliss, Rayford Halsted, MD as PCP - General (Internal Medicine)    Past Medical History:  Diagnosis  Date   Arthritis    Cerebrovascular disease    CKD (chronic kidney disease), stage II    Complication of anesthesia    History of stroke    Hyperlipemia    Hypertension    Seizure disorder ([place])    Vitamin D deficiency     Past Surgical History:  Procedure Laterality Date   INGUINAL HERNIA REPAIR Right 11/22/2021   Procedure: REDUCTION OF INGUINAL HERNIA;  Surgeon: Leighton Ruff, MD;  Location: [facility];  Service: General;  Laterality: Right;   INGUINAL HERNIA REPAIR Right 11/23/2021   Procedure: HERNIA REPAIR INGUINAL ADULT WITH MESH;  Surgeon: Jovita Kussmaul, MD;  Location: [facility];  Service: General;  Laterality: Right;   KNEE SURGERY  2007   menicus    LOOP RECORDER INSERTION N/A 03/05/2020   Procedure: LOOP RECORDER INSERTION;  Surgeon: Thompson Grayer, MD;  Location: [facility];  Service: Cardiovascular;  Laterality: N/A;   ROTATOR CUFF REPAIR Right 2006   TOTAL HIP ARTHROPLASTY Right 06/16/2015   Procedure: RIGHT TOTAL HIP ARTHROPLASTY ANTERIOR APPROACH;  Surgeon: Gaynelle Arabian, MD;  Location: [facility];  Service: Orthopedics;  Laterality: Right;   TOTAL HIP ARTHROPLASTY Left 09/06/2016   Procedure: LEFT TOTAL HIP ARTHROPLASTY ANTERIOR APPROACH;  Surgeon: Gaynelle Arabian, MD;  Location: [facility];  Service: Orthopedics;  Laterality: Left;      Current Medications  Current Outpatient Medications  Medication Sig Dispense Refill   atorvastatin (LIPITOR) 80 MG tablet Take 1 tablet (80 mg total) by mouth daily. 90 tablet 3   clopidogrel (PLAVIX) 75 MG tablet Take 1 tablet (75 mg total) by mouth daily. 90 tablet 3   Evolocumab (REPATHA SURECLICK) [id number] MG/ML SOAJ INJECT 140 MG INTO THE SKIN EVERY 14 DAYS 2 mL 10   levETIRAcetam (KEPPRA) 500 MG tablet Take 1 tablet (500 mg total) by mouth 2 (two) times daily. 180 tablet 3   lisinopril (ZESTRIL) 10 MG tablet Take 1 tablet (10 mg total) by mouth daily. 90 tablet 3   No current facility-administered medications for this visit.     Allergies Penicillins and Pneumococcal vaccines   Social History Social History   Tobacco Use   Smoking status: Former    Packs/day: 0.25    Years: 25.00    Total pack years: 6.25  Types: Cigarettes    Quit date: 12/18/1984    Years since quitting: 38.1   Smokeless tobacco: Never  Substance Use Topics   Alcohol use: Yes    Alcohol/week: 0.0 standard drinks of alcohol    Comment: gin and tonic and glass of wine daily      Review of Systems Review of Systems  Constitutional:  Negative for chills, fever, malaise/fatigue and weight loss.  HENT:  Negative for hearing loss and tinnitus.   Eyes:  Negative for blurred vision and double vision.  Respiratory:  Negative for cough, hemoptysis, shortness of breath and wheezing.   Cardiovascular:  Negative for chest pain, palpitations and leg swelling.  Gastrointestinal:  Negative for abdominal pain, blood in stool, constipation, diarrhea, heartburn, melena, nausea and vomiting.  Genitourinary:  Negative for hematuria.  Musculoskeletal:  Negative for myalgias and neck pain.  Skin:  Negative for itching and rash.  Neurological:  Negative for dizziness, weakness and headaches.  Psychiatric/Behavioral:  Negative for depression and memory loss. The patient is not nervous/anxious and does not have insomnia.      Physical Exam:      Body mass index is 18.74 kg/m. BP (!) 140/80 (BP Location: Left Arm, Patient Position: Sitting, Cuff Size: Normal)   Pulse 72   Temp 98.3 F (36.8 C)   Resp 18   Ht 5' 1"$  (1.549 m)   Wt 99 lb 3 oz (45 kg)   SpO2 97%   BMI 18.74 kg/m   Physical Exam   Assessment:      Essential hypertension  History of CVA (cerebrovascular accident) - Plan: levETIRAcetam (KEPPRA) 500 MG tablet  Stage 2 chronic kidney disease  Hyperlipidemia, unspecified hyperlipidemia type  Seizure disorder (HCC)  Primary osteoarthritis involving multiple joints    Plan:     During the course of the visit the  patient was educated and counseled about appropriate screening and preventive services including:   Pneumococcal vaccine  Influenza vaccine Screening mammography Bone densitometry screening Colorectal cancer screening  Diet review for nutrition referral? Yes ____  Not Indicated _X___   Patient Instructions (the written plan) was given to the patient.  Essential hypertension Her BP is borderline.  We will see what her BP is running on her next visit.  We may need to adjust her lisinopril at that time.  Seizure disorder [facility]) She has not had any recent seizures.  She remains on keppra at this time.  I have reviewed her notes from neurology.  Primary osteoarthritis involving multiple joints She has arthritis of multiple areas including her hips, hands, and knees.  [facility] has seen [facility] who is also following her shoulder.  She has had steriod injections in her knee in the past.  We will continue to follow.  Stage 2 chronic kidney disease She has possible Stage II CKD.  She denies any NSAID use.  I will recheck her BMP today and a urine albumin/creatinine ratio.  History of CVA (cerebrovascular accident) She has long standing aphasia from her stroke but no residual problems.  We will continue with risk factor modificaton and she remains on plavix daily.    Hyperlipidemia Her goal LDL with her stroke is less than 70.   She is on lipitor and repatha but has not had a FLP done in a while.  We will check this today.   Prevention Health maintenance discussed.  She does not want a prevnar13/20 as she had problems with her pneumovax 23 in the past.  We discussed the shingles and RSV vaccines and she is not interested in this as well.  She has never had a colonoscopy where she is aged out for screening.  She does not want any mammograms as well.  Her BMI was noted.  I want her to continue to eat healthy and continue to be active and exercise.  We will check some yearly labs.    Medicare  Attestation I have personally reviewed: The patient's medical and social history Their use of alcohol, tobacco or illicit drugs Their current medications and supplements The patient's functional ability including ADLs,fall risks, home safety risks, cognitive, and hearing and visual impairment Diet and physical activities Evidence for depression or mood disorders  The patient's weight, height, and BMI have been recorded in the chart.  I have made referrals, counseling, and provided education to the patient based on review of the above and I have provided the patient with a written personalized care plan for preventive services.     Townsend Roger, MD   02/01/2023

## 2023-02-01 NOTE — Assessment & Plan Note (Signed)
She has possible Stage II CKD.  She denies any NSAID use.  I will recheck her BMP today and a urine albumin/creatinine ratio.

## 2023-02-02 LAB — CUP PACEART REMOTE DEVICE CHECK
Date Time Interrogation Session: 20240214230601
Implantable Pulse Generator Implant Date: 20210319

## 2023-02-05 ENCOUNTER — Ambulatory Visit: Payer: PPO

## 2023-02-05 DIAGNOSIS — I639 Cerebral infarction, unspecified: Secondary | ICD-10-CM

## 2023-02-06 ENCOUNTER — Ambulatory Visit: Payer: PPO

## 2023-02-06 ENCOUNTER — Other Ambulatory Visit: Payer: Self-pay

## 2023-02-06 DIAGNOSIS — I10 Essential (primary) hypertension: Secondary | ICD-10-CM

## 2023-02-06 DIAGNOSIS — E785 Hyperlipidemia, unspecified: Secondary | ICD-10-CM

## 2023-02-06 DIAGNOSIS — N182 Chronic kidney disease, stage 2 (mild): Secondary | ICD-10-CM | POA: Diagnosis not present

## 2023-02-06 DIAGNOSIS — G40909 Epilepsy, unspecified, not intractable, without status epilepticus: Secondary | ICD-10-CM

## 2023-02-06 DIAGNOSIS — M159 Polyosteoarthritis, unspecified: Secondary | ICD-10-CM

## 2023-02-06 DIAGNOSIS — Z8673 Personal history of transient ischemic attack (TIA), and cerebral infarction without residual deficits: Secondary | ICD-10-CM

## 2023-02-06 NOTE — Progress Notes (Signed)
[facility] Summary Report / Loop Recorder 

## 2023-02-09 ENCOUNTER — Other Ambulatory Visit: Payer: Self-pay

## 2023-02-09 DIAGNOSIS — Z8673 Personal history of transient ischemic attack (TIA), and cerebral infarction without residual deficits: Secondary | ICD-10-CM

## 2023-02-09 MED ORDER — LEVETIRACETAM 500 MG PO TABS: 500.0000 mg | ORAL_TABLET | Freq: Two times a day (BID) | ORAL | 3 refills | Status: AC

## 2023-02-09 MED ORDER — LISINOPRIL 10 MG PO TABS: 10.0000 mg | ORAL_TABLET | Freq: Every day | ORAL | 3 refills | Status: AC

## 2023-02-09 MED ORDER — REPATHA SURECLICK 140 MG/ML ~~LOC~~ SOAJ: SUBCUTANEOUS | 10 refills | Status: AC

## 2023-02-22 ENCOUNTER — Ambulatory Visit: Payer: PPO

## 2023-02-23 LAB — CMP14 + ANION GAP
ALT: 27 IU/L (ref 0–32)
AST: 33 IU/L (ref 0–40)
Albumin/Globulin Ratio: 2 (ref 1.2–2.2)
Albumin: 4.5 g/dL (ref 3.7–4.7)
Alkaline Phosphatase: 125 IU/L — ABNORMAL HIGH (ref 44–121)
Anion Gap: 18 mmol/L (ref 10.0–18.0)
BUN/Creatinine Ratio: 21 (ref 12–28)
BUN: 14 mg/dL (ref 8–27)
Bilirubin Total: 0.7 mg/dL (ref 0.0–1.2)
CO2: 20 mmol/L (ref 20–29)
Calcium: 9.3 mg/dL (ref 8.7–10.3)
Chloride: 96 mmol/L (ref 96–106)
Creatinine, Ser: 0.67 mg/dL (ref 0.57–1.00)
Globulin, Total: 2.2 g/dL (ref 1.5–4.5)
Glucose: 92 mg/dL (ref 70–99)
Potassium: 4.5 mmol/L (ref 3.5–5.2)
Sodium: 134 mmol/L (ref 134–144)
Total Protein: 6.7 g/dL (ref 6.0–8.5)
eGFR: 85 mL/min/{1.73_m2} (ref >59)

## 2023-02-23 LAB — CBC WITH DIFFERENTIAL/PLATELET
Basophils Absolute: 0.1 10*3/uL (ref 0.0–0.2)
Basos: 1 %
EOS (ABSOLUTE): 0.1 10*3/uL (ref 0.0–0.4)
Eos: 1 %
Hematocrit: 34.7 % (ref 34.0–46.6)
Hemoglobin: 12.5 g/dL (ref 11.1–15.9)
Immature Grans (Abs): 0 10*3/uL (ref 0.0–0.1)
Immature Granulocytes: 0 %
Lymphocytes Absolute: 1.1 10*3/uL (ref 0.7–3.1)
Lymphs: 13 %
MCH: 32.1 pg (ref 26.6–33.0)
MCHC: 36 g/dL — ABNORMAL HIGH (ref 31.5–35.7)
MCV: 89 fL (ref 79–97)
Monocytes Absolute: 0.7 10*3/uL (ref 0.1–0.9)
Monocytes: 9 %
Neutrophils Absolute: 6.3 10*3/uL (ref 1.4–7.0)
Neutrophils: 76 %
Platelets: 306 10*3/uL (ref 150–450)
RBC: 3.89 x10E6/uL (ref 3.77–5.28)
RDW: 12.4 % (ref 11.7–15.4)
WBC: 8.2 10*3/uL (ref 3.4–10.8)

## 2023-02-23 LAB — LIPID PANEL
Chol/HDL Ratio: 1.6 ratio (ref 0.0–4.4)
Cholesterol, Total: 191 mg/dL (ref 100–199)
HDL: 117 mg/dL (ref >39)
LDL Chol Calc (NIH): 62 mg/dL (ref 0–99)
Triglycerides: 67 mg/dL (ref 0–149)
VLDL Cholesterol Cal: 12 mg/dL (ref 5–40)

## 2023-02-23 LAB — HEMOGLOBIN A1C
Est. average glucose Bld gHb Est-mCnc: 123 mg/dL
Hgb A1c MFr Bld: 5.9 % — ABNORMAL HIGH (ref 4.8–5.6)

## 2023-02-23 LAB — TSH: TSH: 1.23 u[IU]/mL (ref 0.450–4.500)

## 2023-03-05 NOTE — Progress Notes (Signed)
Patient called.  Patient aware.     Ve: Her labs look good. Her cholesterol is controlled.

## 2023-03-09 LAB — CUP PACEART REMOTE DEVICE CHECK
Date Time Interrogation Session: 20240318230601
Implantable Pulse Generator Implant Date: 20210319

## 2023-03-12 ENCOUNTER — Ambulatory Visit (INDEPENDENT_AMBULATORY_CARE_PROVIDER_SITE_OTHER): Payer: PPO

## 2023-03-12 DIAGNOSIS — I639 Cerebral infarction, unspecified: Secondary | ICD-10-CM

## 2023-03-15 NOTE — Progress Notes (Signed)
[facility] Summary Report / Loop Recorder 

## 2023-04-03 ENCOUNTER — Ambulatory Visit: Payer: PPO | Admitting: Internal Medicine

## 2023-04-03 ENCOUNTER — Encounter: Payer: Self-pay | Admitting: Internal Medicine

## 2023-04-03 VITALS — BP 120/70 | HR 78 | Temp 97.2°F | Resp 18 | Ht 62.0 in | Wt 98.0 lb

## 2023-04-03 DIAGNOSIS — G40909 Epilepsy, unspecified, not intractable, without status epilepticus: Secondary | ICD-10-CM

## 2023-04-03 DIAGNOSIS — I10 Essential (primary) hypertension: Secondary | ICD-10-CM | POA: Diagnosis not present

## 2023-04-03 DIAGNOSIS — R4701 Aphasia: Secondary | ICD-10-CM

## 2023-04-03 DIAGNOSIS — E782 Mixed hyperlipidemia: Secondary | ICD-10-CM | POA: Diagnosis not present

## 2023-04-03 DIAGNOSIS — R5381 Other malaise: Secondary | ICD-10-CM

## 2023-04-03 NOTE — Assessment & Plan Note (Signed)
On keppra.

## 2023-04-03 NOTE — Assessment & Plan Note (Signed)
She will be moving to [facility] for safety if she met the criteria.

## 2023-04-03 NOTE — Assessment & Plan Note (Signed)
From stroke

## 2023-04-03 NOTE — Assessment & Plan Note (Signed)
controlled 

## 2023-04-03 NOTE — Progress Notes (Signed)
   Office Visit  Subjective   Patient ID: Heather Nielsen   DOB: [birth year]   Age: 89 y.o.   MRN: [id number]   Chief Complaint Chief Complaint  Patient presents with   office visit    Needs assessment for possible assisted living placement     History of Present Illness 89 years old female is here for evaluation and FL2 form to be filled for possible admission to [facility]. Family and staff has concern about her living independently. She has stroke in 2021 with hypertension and hyperlipidemia for that she take atorvastatin and repatha 2 week. Her lipd anel done last month showed that her LDL is target controlled.  She is a pleasant lady who denies any specific problem. She often get confused in the evening and last night staff was called about sun downing. She also wondered around in the evening. This problem is getting worse lately and family also wanted her to be sent to [facility] for safety. Her speech is slurred since stroke.    Past Medical History Past Medical History:  Diagnosis Date   Arthritis    Cerebrovascular disease    CKD (chronic kidney disease), stage II    Complication of anesthesia    History of stroke    Hyperlipemia    Hypertension    Seizure disorder    Vitamin D deficiency      Allergies Allergies  Allergen Reactions   Penicillins Other (See Comments)    Urinary issues--pt.says "felt like razor blades in her bladder"   Pneumococcal Vaccines Hives, Itching and Rash     Review of Systems Review of Systems  Constitutional: Negative.   HENT: Negative.    Respiratory: Negative.    Cardiovascular: Negative.   Gastrointestinal: Negative.   Neurological: Negative.        Objective:    Vitals BP 120/70 (BP Location: Left Arm, Patient Position: Sitting, Cuff Size: Normal)   Pulse 78   Temp (!) 97.2 F (36.2 C)   Resp 18   Ht  (1.575 m)   Wt 98 lb (44.5 kg)   SpO2 99%   BMI 17.92 kg/m    Physical Examination Physical Exam Constitutional:       Appearance: Normal appearance.  Cardiovascular:     Rate and Rhythm: Regular rhythm.  Pulmonary:     Effort: Pulmonary effort is normal.     Breath sounds: Normal breath sounds.  Abdominal:     General: Bowel sounds are normal.  Neurological:     Mental Status: She is alert.     Comments: Walk with small steps and slurred speech since stroke.        Assessment & Plan:   Hypertension controlled  Seizure disorder (HCC) On keppra  Hyperlipidemia controlled  Aphasia From stroke  Debility She will be moving to [facility] for safety if she met the criteria. FL2 form was filled.    No follow-ups on file.   Eloisa Northern, MD

## 2023-04-12 ENCOUNTER — Emergency Department (HOSPITAL_COMMUNITY)
Admission: EM | Admit: 2023-04-12 | Discharge: 2023-04-12 | Disposition: A | Discharge status: Home / Self Care | Payer: PPO | Attending: Emergency Medicine | Admitting: Emergency Medicine

## 2023-04-12 ENCOUNTER — Emergency Department (HOSPITAL_COMMUNITY): Payer: PPO

## 2023-04-12 DIAGNOSIS — R519 Headache, unspecified: Secondary | ICD-10-CM | POA: Insufficient documentation

## 2023-04-12 DIAGNOSIS — M25562 Pain in left knee: Secondary | ICD-10-CM | POA: Insufficient documentation

## 2023-04-12 DIAGNOSIS — W01198A Fall on same level from slipping, tripping and stumbling with subsequent striking against other object, initial encounter: Secondary | ICD-10-CM | POA: Insufficient documentation

## 2023-04-12 DIAGNOSIS — Z8673 Personal history of transient ischemic attack (TIA), and cerebral infarction without residual deficits: Secondary | ICD-10-CM | POA: Diagnosis not present

## 2023-04-12 DIAGNOSIS — M25552 Pain in left hip: Secondary | ICD-10-CM | POA: Insufficient documentation

## 2023-04-12 DIAGNOSIS — Z7902 Long term (current) use of antithrombotics/antiplatelets: Secondary | ICD-10-CM | POA: Insufficient documentation

## 2023-04-12 DIAGNOSIS — E876 Hypokalemia: Secondary | ICD-10-CM | POA: Insufficient documentation

## 2023-04-12 DIAGNOSIS — W19XXXA Unspecified fall, initial encounter: Secondary | ICD-10-CM

## 2023-04-12 LAB — CBC WITH DIFFERENTIAL/PLATELET
Abs Immature Granulocytes: 0.05 10*3/uL (ref 0.00–0.07)
Basophils Absolute: 0 10*3/uL (ref 0.0–0.1)
Basophils Relative: 0 %
Eosinophils Absolute: 0 10*3/uL (ref 0.0–0.5)
Eosinophils Relative: 0 %
HCT: 36.5 % (ref 36.0–46.0)
Hemoglobin: 12.5 g/dL (ref 12.0–15.0)
Immature Granulocytes: 1 %
Lymphocytes Relative: 5 %
Lymphs Abs: 0.5 10*3/uL — ABNORMAL LOW (ref 0.7–4.0)
MCH: 30.7 pg (ref 26.0–34.0)
MCHC: 34.2 g/dL (ref 30.0–36.0)
MCV: 89.7 fL (ref 80.0–100.0)
Monocytes Absolute: 1 10*3/uL (ref 0.1–1.0)
Monocytes Relative: 10 %
Neutro Abs: 8.6 10*3/uL — ABNORMAL HIGH (ref 1.7–7.7)
Neutrophils Relative %: 84 %
Platelets: 232 10*3/uL (ref 150–400)
RBC: 4.07 MIL/uL (ref 3.87–5.11)
RDW: 13.2 % (ref 11.5–15.5)
WBC: 10.2 10*3/uL (ref 4.0–10.5)
nRBC: 0 % (ref 0.0–0.2)

## 2023-04-12 LAB — COMPREHENSIVE METABOLIC PANEL
ALT: 30 U/L (ref 0–44)
AST: 41 U/L (ref 15–41)
Albumin: 3.9 g/dL (ref 3.5–5.0)
Alkaline Phosphatase: 105 U/L (ref 38–126)
Anion gap: 13 (ref 5–15)
BUN: 7 mg/dL — ABNORMAL LOW (ref 8–23)
CO2: 22 mmol/L (ref 22–32)
Calcium: 8.8 mg/dL — ABNORMAL LOW (ref 8.9–10.3)
Chloride: 99 mmol/L (ref 98–111)
Creatinine, Ser: 0.49 mg/dL (ref 0.44–1.00)
GFR, Estimated: >60 mL/min (ref >60)
Glucose, Bld: 107 mg/dL — ABNORMAL HIGH (ref 70–99)
Potassium: 3.2 mmol/L — ABNORMAL LOW (ref 3.5–5.1)
Sodium: 134 mmol/L — ABNORMAL LOW (ref 135–145)
Total Bilirubin: 1.3 mg/dL — ABNORMAL HIGH (ref 0.3–1.2)
Total Protein: 6.8 g/dL (ref 6.5–8.1)

## 2023-04-12 LAB — CK: Total CK: 366 U/L — ABNORMAL HIGH (ref 38–234)

## 2023-04-12 MED ORDER — POTASSIUM CHLORIDE 20 MEQ PO PACK
40.0000 meq | PACK | Freq: Once | ORAL | Status: AC
  Administered 2023-04-12: 40 meq via ORAL
  Filled 2023-04-12: qty 2

## 2023-04-12 MED ORDER — SODIUM CHLORIDE 0.9 % IV BOLUS
500.0000 mL | Freq: Once | INTRAVENOUS | Status: AC
  Administered 2023-04-12: 500 mL via INTRAVENOUS

## 2023-04-12 NOTE — ED Notes (Signed)
Pt ambulated to restroom with walker, unsteady gait and shuffling, with nurse assist. Would not recommend walking independently at this time

## 2023-04-12 NOTE — ED Triage Notes (Signed)
Pt BIB EMS from assisted living facility after having a fall and hitting head, on Plavix. C/o lower back, hip and left knee pain. Pain on palpation at back of head. Able to move all extremities. Hx of stroke with aphasia at baseline. Aox4. Mentation is baseline

## 2023-04-12 NOTE — ED Notes (Signed)
X-ray at bedside

## 2023-04-12 NOTE — ED Notes (Signed)
Pt family at bedside states that pt had a fall a few days ago and knee pain is from previous fall. Family also states patient has been having "mini stroke" episodes where she doesn't remember falling.

## 2023-04-12 NOTE — Progress Notes (Signed)
Orthopedic Tech Progress Note Patient Details:  Heather Nielsen 1934-01-16 [id number] Level 2 Trauma. Not needed Patient ID: [name], female   DOB: [birth year], 89 y.o.   MRN: [id number]  Lovett Calender 04/12/2023, 12:36 PM

## 2023-04-12 NOTE — ED Provider Notes (Signed)
[place] EMERGENCY DEPARTMENT AT [facility] Provider Note   CSN: [id number] Arrival date & time:        History  Chief Complaint  Patient presents with   Melvenia Needles is a 89 y.o. female.  HPI 89 year old female present after a fall.  History is from EMS as well as the daughter.  Patient fell in her independent living residence.  Patient does not remember the fall but according to the daughter she did not remember the fall a couple days ago either.  She has had multiple strokes and mini strokes and has chronic trouble speaking.  All of this is unchanged today.  She reportedly fell though it is unclear what time though she hit the life alert.  Is complaining of left knee pain though the daughter states she injured her left knee on the fall 2 days ago and has been ambulatory since.  Also notes some left hip pain.  EMS activated a level 2 trauma as she is on Plavix and she had some occipital scalp tenderness on exam.  They applied a c-collar.  The daughter states that the patient seems to be at her baseline and they were told that these episodes of falling/passing out/not remembering are normal according to her previous strokes. Was told they might be "mini seizures". They have been able to get her into assisted living and she is due to change today.  Home Medications Prior to Admission medications   Medication Sig Start Date End Date Taking? Authorizing Provider  atorvastatin (LIPITOR) 80 MG tablet Take 1 tablet (80 mg total) by mouth daily. 02/01/23  Yes Crist Fat, MD  clopidogrel (PLAVIX) 75 MG tablet Take 1 tablet (75 mg total) by mouth daily. 02/01/23  Yes Crist Fat, MD  Evolocumab (REPATHA SURECLICK) 140 MG/ML SOAJ INJECT 140 MG INTO THE SKIN EVERY 14 DAYS Patient taking differently: Inject 140 mg into the skin every 14 (fourteen) days. INJECT 140 MG INTO THE SKIN EVERY 14 DAYS 02/09/23  Yes Crist Fat, MD  levETIRAcetam (KEPPRA) 500 MG tablet Take 1  tablet (500 mg total) by mouth 2 (two) times daily. 02/09/23  Yes Crist Fat, MD  lisinopril (ZESTRIL) 10 MG tablet Take 1 tablet (10 mg total) by mouth daily. 02/09/23  Yes Crist Fat, MD      Allergies    Penicillins and Pneumococcal vaccines    Review of Systems   Review of Systems  Musculoskeletal:  Positive for arthralgias.  Neurological:  Negative for headaches.    Physical Exam Updated Vital Signs BP (!) 153/76   Pulse 74   Temp (!) 97.4 F (36.3 C) (Oral)   Resp 17   Ht 5\' 2"  (1.575 m)   Wt 44.5 kg   SpO2 100%   BMI 17.94 kg/m  Physical Exam Vitals and nursing note reviewed.  Constitutional:      Appearance: She is well-developed.     Interventions: Cervical collar in place.  HENT:     Head: Normocephalic and atraumatic.  Cardiovascular:     Rate and Rhythm: Normal rate and regular rhythm.     Pulses:          Dorsalis pedis pulses are 2+ on the right side and 2+ on the left side.     Heart sounds: Normal heart sounds.  Pulmonary:     Effort: Pulmonary effort is normal.     Breath sounds: Normal breath sounds.  Abdominal:  Palpations: Abdomen is soft.     Tenderness: There is no abdominal tenderness.  Musculoskeletal:     Cervical back: No tenderness.     Thoracic back: No tenderness.     Lumbar back: No tenderness.     Left hip: Tenderness present. Normal range of motion.     Left upper leg: No swelling or deformity.     Right knee: Normal range of motion. No tenderness.     Left knee: Swelling present. Normal range of motion. No tenderness.     Left lower leg: No tenderness.  Skin:    General: Skin is warm and dry.  Neurological:     Mental Status: She is alert and oriented to person, place, and time.     Comments: Alert and oriented to self, place, and time. She has some trouble speaking but can get her ideas/words out. She has equal strength in all 4 extremities.      ED Results / Procedures / Treatments   Labs (all labs ordered are  listed, but only abnormal results are displayed) Labs Reviewed  COMPREHENSIVE METABOLIC PANEL - Abnormal; Notable for the following components:      Result Value   Sodium 134 (*)    Potassium 3.2 (*)    Glucose, Bld 107 (*)    BUN 7 (*)    Calcium 8.8 (*)    Total Bilirubin 1.3 (*)    All other components within normal limits  CBC WITH DIFFERENTIAL/PLATELET - Abnormal; Notable for the following components:   Neutro Abs 8.6 (*)    Lymphs Abs 0.5 (*)    All other components within normal limits  CK - Abnormal; Notable for the following components:   Total CK 366 (*)    All other components within normal limits  URINALYSIS, W/ REFLEX TO CULTURE (INFECTION SUSPECTED)    EKG EKG Interpretation  Date/Time:  Thursday April 12 2023 10:26:55 EDT Ventricular Rate:  78 PR Interval:  178 QRS Duration: 94 QT Interval:  411 QTC Calculation: 469 R Axis:   69 Text Interpretation: Sinus rhythm Atrial premature complex Baseline wander in lead(s) V6 Confirmed by Pricilla Loveless [phone]) on 04/12/2023 11:49:25 AM  Radiology CT Head Wo Contrast  Result Date: 04/12/2023 CLINICAL DATA:  Patient fell and hit the back of head. Chronic anticoagulation. EXAM: CT HEAD WITHOUT CONTRAST CT CERVICAL SPINE WITHOUT CONTRAST TECHNIQUE: Multidetector CT imaging of the head and cervical spine was performed following the standard protocol without intravenous contrast. Multiplanar CT image reconstructions of the cervical spine were also generated. RADIATION DOSE REDUCTION: This exam was performed according to the departmental dose-optimization program which includes automated exposure control, adjustment of the mA and/or kV according to patient size and/or use of iterative reconstruction technique. COMPARISON:  07/22/2022 FINDINGS: CT HEAD FINDINGS Brain: There is no evidence for acute hemorrhage, hydrocephalus, mass lesion, or abnormal extra-axial fluid collection. No definite CT evidence for acute infarction. Diffuse  loss of parenchymal volume is consistent with atrophy. Patchy low attenuation in the deep hemispheric and periventricular white matter is nonspecific, but likely reflects chronic microvascular ischemic demyelination. Old left MCA territory infarct again noted. Vascular: No hyperdense vessel or unexpected calcification. Skull: No evidence for fracture. No worrisome lytic or sclerotic lesion. Sinuses/Orbits: The visualized paranasal sinuses and mastoid air cells are clear. Visualized portions of the globes and intraorbital fat are unremarkable. Other: None. Stable trace CT CERVICAL SPINE FINDINGS Alignment: Degenerative anterolisthesis of C3 on 4 and C7 on T1. Skull base and  vertebrae: No acute fracture. No primary bone lesion or focal pathologic process. Soft tissues and spinal canal: No prevertebral fluid or swelling. No visible canal hematoma. Disc levels: Diffuse loss of intervertebral disc height noted from C3-4 down to C7-T1. There is associated endplate degeneration. Upper chest: Unremarkable. Other: None IMPRESSION: 1. No acute intracranial abnormality. 2. Old left MCA territory infarct. 3. Degenerative changes in the cervical spine without fracture or traumatic subluxation. Electronically Signed   By: Kennith Center M.D.   On: 04/12/2023 11:27   CT Cervical Spine Wo Contrast  Result Date: 04/12/2023 CLINICAL DATA:  Patient fell and hit the back of head. Chronic anticoagulation. EXAM: CT HEAD WITHOUT CONTRAST CT CERVICAL SPINE WITHOUT CONTRAST TECHNIQUE: Multidetector CT imaging of the head and cervical spine was performed following the standard protocol without intravenous contrast. Multiplanar CT image reconstructions of the cervical spine were also generated. RADIATION DOSE REDUCTION: This exam was performed according to the departmental dose-optimization program which includes automated exposure control, adjustment of the mA and/or kV according to patient size and/or use of iterative reconstruction  technique. COMPARISON:  07/22/2022 FINDINGS: CT HEAD FINDINGS Brain: There is no evidence for acute hemorrhage, hydrocephalus, mass lesion, or abnormal extra-axial fluid collection. No definite CT evidence for acute infarction. Diffuse loss of parenchymal volume is consistent with atrophy. Patchy low attenuation in the deep hemispheric and periventricular white matter is nonspecific, but likely reflects chronic microvascular ischemic demyelination. Old left MCA territory infarct again noted. Vascular: No hyperdense vessel or unexpected calcification. Skull: No evidence for fracture. No worrisome lytic or sclerotic lesion. Sinuses/Orbits: The visualized paranasal sinuses and mastoid air cells are clear. Visualized portions of the globes and intraorbital fat are unremarkable. Other: None. Stable trace CT CERVICAL SPINE FINDINGS Alignment: Degenerative anterolisthesis of C3 on 4 and C7 on T1. Skull base and vertebrae: No acute fracture. No primary bone lesion or focal pathologic process. Soft tissues and spinal canal: No prevertebral fluid or swelling. No visible canal hematoma. Disc levels: Diffuse loss of intervertebral disc height noted from C3-4 down to C7-T1. There is associated endplate degeneration. Upper chest: Unremarkable. Other: None IMPRESSION: 1. No acute intracranial abnormality. 2. Old left MCA territory infarct. 3. Degenerative changes in the cervical spine without fracture or traumatic subluxation. Electronically Signed   By: Kennith Center M.D.   On: 04/12/2023 11:27   DG Knee 1-2 Views Left  Result Date: 04/12/2023 CLINICAL DATA:  Pain after fall EXAM: LEFT KNEE - 2 VIEW COMPARISON:  None Available. FINDINGS: Osteopenia. No fracture or dislocation. Minimal osteophytes of the medial compartment. Slight joint space loss of the patellofemoral joint. No joint effusion on lateral view. Diffuse vascular calcifications. Chondrocalcinosis. IMPRESSION: Osteopenia with mild degenerative change.   Chondrocalcinosis. Extensive vascular calcifications. Electronically Signed   By: Karen Kays M.D.   On: 04/12/2023 11:12   DG Hip Unilat W or Wo Pelvis 2-3 Views Left  Result Date: 04/12/2023 CLINICAL DATA:  Pain after fall EXAM: DG HIP (WITH OR WITHOUT PELVIS) 2V LEFT COMPARISON:  None Available. FINDINGS: Osteopenia. No fracture or dislocation. Bilateral hip arthroplasties are identified with Press-Fit components. No hardware failure. The tip of the right femoral component is not included in the imaging field. Extensive vascular calcifications. Degenerative changes of the lumbar spine IMPRESSION: Osteopenia. Bilateral hip hemi arthroplasties. The femoral component of the right arthroplasties not completely included in the imaging field of the left hip examination. Extensive vascular calcifications. Electronically Signed   By: Karen Kays M.D.   On:  04/12/2023 11:11   DG Chest 1 View  Result Date: 04/12/2023 CLINICAL DATA:  Pain after fall EXAM: CHEST  1 VIEW COMPARISON:  X-ray 07/22/2022 FINDINGS: Hyperinflation. No consolidation, pneumothorax or effusion. No edema. Normal cardiopericardial silhouette. Calcified aorta. Loop recorder overlying the left hemithorax. Overlapping cardiac leads. Osteopenia. Degenerative changes of the spine IMPRESSION: Hyperinflation.  No acute cardiopulmonary disease.  Loop recorder Electronically Signed   By: Karen Kays M.D.   On: 04/12/2023 11:10    Procedures Procedures    Medications Ordered in ED Medications  sodium chloride 0.9 % bolus 500 mL (0 mLs Intravenous Stopped 04/12/23 1346)  potassium chloride (KLOR-CON) packet 40 mEq (40 mEq Oral Given 04/12/23 1307)    ED Course/ Medical Decision Making/ A&P                             Medical Decision Making Amount and/or Complexity of Data Reviewed Labs: ordered.    Details: Mild hypokalemia. Radiology: ordered and independent interpretation performed.    Details: No head bleed.  No acute fracture of  the knee or hip. ECG/medicine tests: ordered and independent interpretation performed.    Details: No ischemia or obvious arrhythmia.  Risk Prescription drug management.   Patient presents with a fall.  She has been having some trouble walking since these falls.  Unclear if this is from her knee versus balance issues.  I discussed with the daughter at the bedside, who is also her legal guardian.  She think she might be having "mini seizures".  She does not seem to remember these falls.  They have upgraded her to assisted living and felt comfortable with this.  No significant trauma noted.  Labs are overall reassuring besides some mild hypokalemia.  We did interrogate her loop recorder but I have not got the results of this yet though daughter feels like she can go home and we will follow-up results later as she often gets this interrogated.  I think syncope is less likely I think is reasonable to let her go home        Final Clinical Impression(s) / ED Diagnoses Final diagnoses:  Fall, initial encounter    Rx / DC Orders ED Discharge Orders     None         Pricilla Loveless, MD 04/12/23 1423

## 2023-04-12 NOTE — Progress Notes (Signed)
Pt. Reported to ED as fall on thinners.  Per family pt has had several fall. Chaplain provided support to patient and family at bedside.  Chaplain available as needed.  Venida Jarvis, [place], [facility], Pager [phone]

## 2023-04-12 NOTE — ED Notes (Signed)
Pt taken to CT with trauma RN Orpha Bur

## 2023-04-16 ENCOUNTER — Ambulatory Visit: Payer: PPO

## 2023-04-16 DIAGNOSIS — I639 Cerebral infarction, unspecified: Secondary | ICD-10-CM | POA: Diagnosis not present

## 2023-04-16 LAB — CUP PACEART REMOTE DEVICE CHECK
Date Time Interrogation Session: 20240428231020
Implantable Pulse Generator Implant Date: 20210319

## 2023-04-19 NOTE — Progress Notes (Signed)
[facility] Summary Report / Loop Recorder 

## 2023-05-03 ENCOUNTER — Ambulatory Visit: Payer: PPO | Admitting: Internal Medicine

## 2023-05-16 NOTE — Progress Notes (Signed)
[facility] Summary Report / Loop Recorder 

## 2023-07-11 ENCOUNTER — Ambulatory Visit (INDEPENDENT_AMBULATORY_CARE_PROVIDER_SITE_OTHER): Payer: PPO | Admitting: Neurology

## 2023-07-11 ENCOUNTER — Encounter: Payer: Self-pay | Admitting: Neurology

## 2023-07-11 VITALS — BP 157/61 | HR 76 | Ht 61.0 in | Wt 105.0 lb

## 2023-07-11 DIAGNOSIS — F0150 Vascular dementia without behavioral disturbance: Secondary | ICD-10-CM

## 2023-07-11 DIAGNOSIS — I69320 Aphasia following cerebral infarction: Secondary | ICD-10-CM | POA: Diagnosis not present

## 2023-07-11 DIAGNOSIS — G40009 Localization-related (focal) (partial) idiopathic epilepsy and epileptic syndromes with seizures of localized onset, not intractable, without status epilepticus: Secondary | ICD-10-CM

## 2023-07-11 NOTE — Progress Notes (Signed)
[facility] [address] [place]. [place] [address] [phone]       OFFICE FOLLOW UPVISIT NOTE  Ms. Heather Nielsen Date of Birth:  12/09/1934 Medical Record Number:  [id number]   Referring MD: Peggye Pitt  Reason for Referral: Seizure HPI: Initial visit 03/22/2021 Heather Nielsen is a 89 year old pleasant Caucasian lady seen for consultation visit today for seizures.  History is obtained from the patient and her daughter as well as review of electronic medical records and personally reviewed imaging films in PACS.  She has past medical history of hypertension, hyperlipidemia, arthritis and cryptogenic left MCA infarct on 03/04/2020 with residual expressive aphasia.  She was last seen by me for follow-up further on 04/06/2020 and still had significant aphasia.  She has been living at independent living at [facility] but follow-up 3 to 4 weeks prior to 11/21/2020 when she was seen at the hospital she was having some increasing episodes of labile affect with easy crying and laughter and refusal daily checks.  Patient then was noted as having sudden onset of speech difficulty and disorientation.  The daughter called 911 and when they arrived they found the patient to have stool incontinence and significant amount of diarrhea in the toilet and she had soiled herself.  There was no reported tongue biting there was no witnessed seizure activity but patient was quite confused and disoriented.  There is no signs of any recent infection.  Patient had somewhat similar episode a few weeks ago when she had soiled herself and was confused and disoriented after that.  Patient had an EEG done which showed continuous 3 to 6 Hz Teater delta range slowing in the left hemisphere maximal in the left temporal region.  MRI scan of the brain showed no acute abnormality but did show remote infarcts in left basal ganglia/corona radiata, left thalamus and left cerebellum with left temporal lobe as well.  There  was 8 mm meningioma noted in the left frontotemporal convexity without mass-effect.  Comprehensive metabolic panel was significant only for slightly low sodium of 133.  CBC magnesium and phosphorus are normal.  UA was not checked.  Covid test was negative.  Patient started on Keppra for presumed seizures and has done well since then.  She is tolerating finder milligram twice daily fairly well without any behavioral side effects no dizziness.  She has had no further episodes of incontinence or confusion disorientation.  She remains on Plavix for stroke prevention and is tolerating well without bruising or bleeding.  Blood pressure has been high in the 150s is usually today it is 166/77.  She is tolerating Lipitor well without muscle aches and pains but not sure when last lipid profile was checked.  There is no prior history of seizures, significant head injury with loss of consciousness.  Patient still fiercely independent and tries to take her own blood pressure and take her own medicines.  She was given a prescription for Keppra but she ran out of it 2 days ago. Update 09/21/2021: She returns for follow-up after last visit 6 months ago.  She is accompanied by her daughter.  She states she is doing well.  She has had no seizure or seizure-like episodes of confusion or disorientation.  She is tolerating Keppra well without any side effects.  She had lab work done on 03/22/2021 which showed hemoglobin A1c 5.8.  Total cholesterol was 239, LDL cholesterol was elevated at 1 1 9  mg percent and HDL was also 1 1 3  mg  percent.  EEG on 04/19/2021 showed focal left temporal slowing but no definite epileptiform activity.  She has since been started on Repatha injections by me and seems to be tolerating it well without any side effects.  She continues to have occasional word finding difficulties and word hesitancy but is learned to talk slowly.  She has moved and is now staying at [facility] independent living and likes the place  much better than the older 1.  She has no new complaints.  She is tolerating Plavix well but does have minor bruising.  She is still on Lipitor 80 mg but she has not had any follow-up lipid profile checked to see if we can reduce this.  She continues to have frequent urinary incontinence but has not yet discussed this with primary care physician. Update 07/11/2023 : Patient is referred back to be seen today I am not sure why as there is no accompanying family or accompanying note.  Patient is accompanied by transporter from [facility].  When I called the patient's daughter she did not pick up the phone and left a message. Call [facility] administrator he also did not pick up the phone.  The transporter was unable to provide me with reason for this visit and the patient is also unable to tell me why she is here.  Patient is not cooperative for detailed cognitive testing today and is unable to give me any meaningful history due to her baseline aphasia. ROS:   14 system review of systems is positive for incontinence speech difficulties, confusion, disorientation, incontinence of stool, emotional lability all other systems negative  PMH:  Past Medical History:  Diagnosis Date   Arthritis    Cerebrovascular disease    CKD (chronic kidney disease), stage II    Complication of anesthesia    History of stroke    Hyperlipemia    Hypertension    Seizure disorder (HCC)    Vitamin D deficiency     Social History:  Social History   Socioeconomic History   Marital status: Widowed    Spouse name: Not on file   Number of children: 5   Years of education: Not on file   Highest education level: Not on file  Occupational History   Not on file  Tobacco Use   Smoking status: Former    Current packs/day: 0.00    Average packs/day: 0.3 packs/day for 25.0 years (6.3 ttl pk-yrs)    Types: Cigarettes    Start date: 12/19/1959    Quit date: 12/18/1984    Years since quitting: 38.5   Smokeless tobacco: Never   Vaping Use   Vaping status: Never Used  Substance and Sexual Activity   Alcohol use: Yes    Alcohol/week: 0.0 standard drinks of alcohol    Comment: gin and tonic and glass of wine daily    Drug use: No   Sexual activity: Not on file  Other Topics Concern   Not on file  Social History Narrative   Lives alone   Right Handed   Drinks 1-2 cups caffeine daily   Social Determinants of Health   Financial Resource Strain: Not on file  Food Insecurity: Not on file  Transportation Needs: Not on file  Physical Activity: Not on file  Stress: Not on file  Social Connections: Not on file  Intimate Partner Violence: Not on file    Medications:   Current Outpatient Medications on File Prior to Visit  Medication Sig Dispense Refill   atorvastatin (LIPITOR)  80 MG tablet Take 1 tablet (80 mg total) by mouth daily. 90 tablet 3   clopidogrel (PLAVIX) 75 MG tablet Take 1 tablet (75 mg total) by mouth daily. 90 tablet 3   Evolocumab (REPATHA SURECLICK) 140 MG/ML SOAJ INJECT 140 MG INTO THE SKIN EVERY 14 DAYS (Patient taking differently: Inject 140 mg into the skin every 14 (fourteen) days. INJECT 140 MG INTO THE SKIN EVERY 14 DAYS) 2 mL 10   levETIRAcetam (KEPPRA) 500 MG tablet Take 1 tablet (500 mg total) by mouth 2 (two) times daily. 180 tablet 3   lisinopril (ZESTRIL) 10 MG tablet Take 1 tablet (10 mg total) by mouth daily. 90 tablet 3   No current facility-administered medications on file prior to visit.    Allergies:   Allergies  Allergen Reactions   Penicillins Other (See Comments)    Urinary issues--pt.says "felt like razor blades in her bladder"   Pneumococcal Vaccines Hives, Itching and Rash    Physical Exam General: Frail elderly petite Caucasian lady, seated, in no evident distress Head: head normocephalic and atraumatic.   Neck: supple with no carotid or supraclavicular bruits Cardiovascular: regular rate and rhythm, no murmurs Musculoskeletal: no deformity mild  kyphoscoliosis Skin:  no rash/petichiae Vascular:  Normal pulses all extremities  Neurologic Exam Mental Status: Awake and fully alert. Oriented to place and time. Recent and remote memory diminished. Attention span, concentration and fund of knowledge diminished mood and affect slightly inappropriate with mild pseudobulbar affect..  Mild expressive aphasia with nonfluent speech with occasional paraphasic errors and word finding difficulties.  Comprehension seems poor as well difficulty with naming but able to repeat quite well.  Unable to cooperate to do a Mini-Mental status exam Cranial Nerves: Fundoscopic exam not done . Pupils equal, briskly reactive to light. Extraocular movements full without nystagmus. Visual fields full to confrontation. Hearing intact. Facial sensation intact. Face, tongue, palate moves normally and symmetrically.  Motor: Normal bulk and tone. Normal strength in all tested extremity muscles.  Diminished fine finger movements on the right.  Orbits left over right upper extremity. Sensory.: intact to touch , pinprick , position and vibratory sensation.  Coordination: Rapid alternating movements normal in all extremities. Finger-to-nose and heel-to-shin performed accurately bilaterally. Gait and Station: Arises from chair without difficulty. Stance is normal. Gait demonstrates short steps with some festination.  Has a wheeled walker.  Reflexes: 1+ and symmetric. Toes downgoing.       ASSESSMENT: 79 old patient with 2 episodes of worsening aphasia confusion disorientation and stool incontinence possibly unwitnessed seizures with postictal confusion symptomatic seizures from her remote left MCA infarct.  History of cryptogenic left MCA infarct with residual aphasia in March 2021.  Vascular risk factors of hypertension and hyperlipidemia.       PLAN: I had a long discussion with the patient and her transporter but was unfortunately not able to reach her daughter over the  phone as well the administrator at [facility] where she lives so did not have a significant history as to how she has done since the last visit.  I recommend she continue Plavix for stroke prevention and maintain aggressive risk factor stratification with blood pressure goal below 140/90, lipids with LDL cholesterol goal below 70 mg percent.  Continue Repatha injections.  Continue Keppra 500 mg twice daily for seizures.  This note can be updated later when more meaningful history is available from the family.  Return for follow-up in the future only as necessary. Return for follow-up in the  future only as necessary.  Greater than 50% time during this 35-minute visit was spent in counseling and coordination of care about her episode of possible seizures and remote stroke and answering questions. Delia Heady, MD Note: This document was prepared with digital dictation and possible smart phrase technology. Any transcriptional errors that result from this process are unintentional.

## 2023-07-11 NOTE — Patient Instructions (Signed)
I had a long discussion with the patient and her transporter but was unfortunately not able to reach her daughter over the phone as weel the administrator at [facility] where she lives so did not have a significant history as to how she has done since the last visit.  I recommend she continue Plavix for stroke prevention and maintain aggressive risk factor stratification with blood pressure goal below 140/90, lipids with LDL cholesterol goal below 70 mg percent.  Continue Repatha injections.  Continue Keppra 500 mg twice daily for seizures.  Return for follow-up in the future only as necessary.

## 2023-09-06 DIAGNOSIS — I10 Essential (primary) hypertension: Secondary | ICD-10-CM | POA: Diagnosis not present

## 2023-09-06 DIAGNOSIS — R4701 Aphasia: Secondary | ICD-10-CM | POA: Diagnosis not present

## 2023-09-06 DIAGNOSIS — E785 Hyperlipidemia, unspecified: Secondary | ICD-10-CM | POA: Diagnosis not present

## 2023-09-06 DIAGNOSIS — R7303 Prediabetes: Secondary | ICD-10-CM

## 2023-09-06 DIAGNOSIS — G40909 Epilepsy, unspecified, not intractable, without status epilepticus: Secondary | ICD-10-CM | POA: Diagnosis not present

## 2023-09-20 ENCOUNTER — Ambulatory Visit: Payer: PPO | Admitting: Neurology

## 2024-02-01 DIAGNOSIS — E785 Hyperlipidemia, unspecified: Secondary | ICD-10-CM | POA: Diagnosis not present

## 2024-02-01 DIAGNOSIS — R4701 Aphasia: Secondary | ICD-10-CM | POA: Diagnosis not present

## 2024-02-01 DIAGNOSIS — I10 Essential (primary) hypertension: Secondary | ICD-10-CM | POA: Diagnosis not present

## 2024-04-23 DIAGNOSIS — E785 Hyperlipidemia, unspecified: Secondary | ICD-10-CM | POA: Diagnosis not present

## 2024-04-23 DIAGNOSIS — R569 Unspecified convulsions: Secondary | ICD-10-CM | POA: Diagnosis not present

## 2024-04-23 DIAGNOSIS — I10 Essential (primary) hypertension: Secondary | ICD-10-CM | POA: Diagnosis not present

## 2024-08-08 DIAGNOSIS — G40909 Epilepsy, unspecified, not intractable, without status epilepticus: Secondary | ICD-10-CM | POA: Diagnosis not present

## 2024-08-08 DIAGNOSIS — E785 Hyperlipidemia, unspecified: Secondary | ICD-10-CM | POA: Diagnosis not present

## 2024-08-08 DIAGNOSIS — I10 Essential (primary) hypertension: Secondary | ICD-10-CM | POA: Diagnosis not present

## 2024-09-22 DIAGNOSIS — I10 Essential (primary) hypertension: Secondary | ICD-10-CM | POA: Diagnosis not present

## 2024-09-22 DIAGNOSIS — E785 Hyperlipidemia, unspecified: Secondary | ICD-10-CM | POA: Diagnosis not present

## 2024-09-22 DIAGNOSIS — G40909 Epilepsy, unspecified, not intractable, without status epilepticus: Secondary | ICD-10-CM | POA: Diagnosis not present

## 2024-11-17 DIAGNOSIS — I10 Essential (primary) hypertension: Secondary | ICD-10-CM | POA: Diagnosis not present

## 2024-11-17 DIAGNOSIS — E785 Hyperlipidemia, unspecified: Secondary | ICD-10-CM | POA: Diagnosis not present

## 2024-11-17 DIAGNOSIS — G40909 Epilepsy, unspecified, not intractable, without status epilepticus: Secondary | ICD-10-CM | POA: Diagnosis not present
